# Patient Record
Sex: Female | Born: 1940
Health system: Southern US, Community
[De-identification: ages and names within clinical notes are randomized; demographics above are authoritative.]

## PROBLEM LIST (undated history)

## (undated) DIAGNOSIS — I824Z2 Acute embolism and thrombosis of unspecified deep veins of left distal lower extremity: Secondary | ICD-10-CM

## (undated) DIAGNOSIS — I82409 Acute embolism and thrombosis of unspecified deep veins of unspecified lower extremity: Secondary | ICD-10-CM

## (undated) DIAGNOSIS — K625 Hemorrhage of anus and rectum: Secondary | ICD-10-CM

## (undated) DIAGNOSIS — F0390 Unspecified dementia without behavioral disturbance: Secondary | ICD-10-CM

## (undated) DIAGNOSIS — E785 Hyperlipidemia, unspecified: Secondary | ICD-10-CM

## (undated) HISTORY — DX: Hemorrhage of anus and rectum: K62.5

## (undated) HISTORY — PX: OTHER SURGICAL HISTORY: SHX169

## (undated) HISTORY — DX: Acute embolism and thrombosis of unspecified deep veins of left distal lower extremity: I82.4Z2

## (undated) HISTORY — DX: Hyperlipidemia, unspecified: E78.5

---
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
## 1999-04-08 ENCOUNTER — Ambulatory Visit (HOSPITAL_BASED_OUTPATIENT_CLINIC_OR_DEPARTMENT_OTHER): Admission: RE | Admit: 1999-04-08 | Discharge: 1999-04-08 | Payer: Self-pay | Admitting: Plastic Surgery

## 1999-04-15 ENCOUNTER — Ambulatory Visit (HOSPITAL_BASED_OUTPATIENT_CLINIC_OR_DEPARTMENT_OTHER): Admission: RE | Admit: 1999-04-15 | Discharge: 1999-04-15 | Payer: Self-pay | Admitting: Plastic Surgery

## 1999-09-17 ENCOUNTER — Encounter: Admission: RE | Admit: 1999-09-17 | Discharge: 1999-09-17 | Payer: Self-pay | Admitting: Family Medicine

## 2000-03-15 ENCOUNTER — Encounter: Admission: RE | Admit: 2000-03-15 | Discharge: 2000-03-15 | Payer: Self-pay | Admitting: Family Medicine

## 2000-03-15 ENCOUNTER — Encounter: Payer: Self-pay | Admitting: Family Medicine

## 2003-02-26 ENCOUNTER — Encounter: Admission: RE | Admit: 2003-02-26 | Discharge: 2003-02-26 | Payer: Self-pay | Admitting: Family Medicine

## 2003-02-26 ENCOUNTER — Encounter: Payer: Self-pay | Admitting: Family Medicine

## 2003-05-07 DIAGNOSIS — E785 Hyperlipidemia, unspecified: Secondary | ICD-10-CM

## 2003-05-07 HISTORY — DX: Hyperlipidemia, unspecified: E78.5

## 2003-05-28 ENCOUNTER — Other Ambulatory Visit: Admission: RE | Admit: 2003-05-28 | Discharge: 2003-05-28 | Payer: Self-pay | Admitting: Family Medicine

## 2004-02-02 ENCOUNTER — Inpatient Hospital Stay (HOSPITAL_COMMUNITY): Admission: EM | Admit: 2004-02-02 | Discharge: 2004-02-04 | Payer: Self-pay | Admitting: Emergency Medicine

## 2004-02-03 DIAGNOSIS — D539 Nutritional anemia, unspecified: Secondary | ICD-10-CM | POA: Insufficient documentation

## 2004-02-03 DIAGNOSIS — D649 Anemia, unspecified: Secondary | ICD-10-CM | POA: Insufficient documentation

## 2004-02-12 ENCOUNTER — Ambulatory Visit (HOSPITAL_COMMUNITY): Admission: RE | Admit: 2004-02-12 | Discharge: 2004-02-12 | Payer: Self-pay | Admitting: General Surgery

## 2004-02-26 ENCOUNTER — Ambulatory Visit (HOSPITAL_COMMUNITY): Admission: RE | Admit: 2004-02-26 | Discharge: 2004-02-26 | Payer: Self-pay | Admitting: Family Medicine

## 2004-10-07 ENCOUNTER — Ambulatory Visit: Payer: Self-pay | Admitting: Family Medicine

## 2004-10-13 ENCOUNTER — Ambulatory Visit: Payer: Self-pay | Admitting: Family Medicine

## 2004-10-13 ENCOUNTER — Other Ambulatory Visit: Admission: RE | Admit: 2004-10-13 | Discharge: 2004-10-13 | Payer: Self-pay | Admitting: Family Medicine

## 2004-10-13 LAB — CONVERTED CEMR LAB: Pap Smear: NORMAL

## 2004-11-11 ENCOUNTER — Encounter: Admission: RE | Admit: 2004-11-11 | Discharge: 2004-11-11 | Payer: Self-pay | Admitting: Family Medicine

## 2004-11-18 ENCOUNTER — Ambulatory Visit: Payer: Self-pay | Admitting: Family Medicine

## 2005-07-08 ENCOUNTER — Ambulatory Visit: Payer: Self-pay | Admitting: Family Medicine

## 2005-12-13 ENCOUNTER — Encounter: Admission: RE | Admit: 2005-12-13 | Discharge: 2005-12-13 | Payer: Self-pay | Admitting: Family Medicine

## 2007-01-12 ENCOUNTER — Encounter: Admission: RE | Admit: 2007-01-12 | Discharge: 2007-01-12 | Payer: Self-pay | Admitting: Family Medicine

## 2007-01-24 ENCOUNTER — Ambulatory Visit: Payer: Self-pay | Admitting: Family Medicine

## 2007-04-20 ENCOUNTER — Ambulatory Visit: Payer: Self-pay | Admitting: Family Medicine

## 2007-04-20 LAB — CONVERTED CEMR LAB
ALT: 18 units/L (ref 0–40)
Basophils Relative: 0.7 % (ref 0.0–1.0)
Bilirubin, Direct: 0.1 mg/dL (ref 0.0–0.3)
CO2: 33 meq/L — ABNORMAL HIGH (ref 19–32)
Calcium: 9.6 mg/dL (ref 8.4–10.5)
Eosinophils Relative: 2.7 % (ref 0.0–5.0)
GFR calc Af Amer: 109 mL/min
Glucose, Bld: 94 mg/dL (ref 70–99)
HCT: 36.6 % (ref 36.0–46.0)
Hemoglobin: 12.1 g/dL (ref 12.0–15.0)
Lymphocytes Relative: 47.8 % — ABNORMAL HIGH (ref 12.0–46.0)
Monocytes Absolute: 0.8 10*3/uL — ABNORMAL HIGH (ref 0.2–0.7)
Neutro Abs: 1.9 10*3/uL (ref 1.4–7.7)
Neutrophils Relative %: 34.3 % — ABNORMAL LOW (ref 43.0–77.0)
Potassium: 3.5 meq/L (ref 3.5–5.1)
Sodium: 141 meq/L (ref 135–145)
Total Protein: 7.4 g/dL (ref 6.0–8.3)
VLDL: 10 mg/dL (ref 0–40)
WBC: 5.5 10*3/uL (ref 4.5–10.5)

## 2007-04-24 ENCOUNTER — Encounter: Payer: Self-pay | Admitting: Family Medicine

## 2007-04-24 DIAGNOSIS — E785 Hyperlipidemia, unspecified: Secondary | ICD-10-CM

## 2007-04-24 DIAGNOSIS — I8390 Asymptomatic varicose veins of unspecified lower extremity: Secondary | ICD-10-CM | POA: Insufficient documentation

## 2007-04-24 DIAGNOSIS — C44300 Unspecified malignant neoplasm of skin of unspecified part of face: Secondary | ICD-10-CM | POA: Insufficient documentation

## 2007-04-24 DIAGNOSIS — C44309 Unspecified malignant neoplasm of skin of other parts of face: Secondary | ICD-10-CM | POA: Insufficient documentation

## 2007-04-24 DIAGNOSIS — M810 Age-related osteoporosis without current pathological fracture: Secondary | ICD-10-CM

## 2007-04-25 ENCOUNTER — Other Ambulatory Visit: Admission: RE | Admit: 2007-04-25 | Discharge: 2007-04-25 | Payer: Self-pay | Admitting: Family Medicine

## 2007-04-25 ENCOUNTER — Encounter: Payer: Self-pay | Admitting: Family Medicine

## 2007-04-25 ENCOUNTER — Ambulatory Visit: Payer: Self-pay | Admitting: Family Medicine

## 2007-04-27 ENCOUNTER — Encounter: Payer: Self-pay | Admitting: Family Medicine

## 2007-05-08 ENCOUNTER — Ambulatory Visit: Payer: Self-pay | Admitting: Family Medicine

## 2007-05-09 ENCOUNTER — Encounter (INDEPENDENT_AMBULATORY_CARE_PROVIDER_SITE_OTHER): Payer: Self-pay | Admitting: *Deleted

## 2007-12-07 HISTORY — PX: COLONOSCOPY: SHX174

## 2008-01-22 ENCOUNTER — Encounter: Admission: RE | Admit: 2008-01-22 | Discharge: 2008-01-22 | Payer: Self-pay | Admitting: Family Medicine

## 2008-01-23 ENCOUNTER — Encounter (INDEPENDENT_AMBULATORY_CARE_PROVIDER_SITE_OTHER): Payer: Self-pay | Admitting: *Deleted

## 2008-05-09 ENCOUNTER — Ambulatory Visit: Payer: Self-pay | Admitting: Family Medicine

## 2008-05-09 LAB — CONVERTED CEMR LAB
ALT: 16 units/L (ref 0–35)
AST: 23 units/L (ref 0–37)
Alkaline Phosphatase: 48 units/L (ref 39–117)
Bilirubin, Direct: 0.1 mg/dL (ref 0.0–0.3)
CO2: 30 meq/L (ref 19–32)
Chloride: 105 meq/L (ref 96–112)
Cholesterol: 222 mg/dL (ref 0–200)
Glucose, Bld: 94 mg/dL (ref 70–99)
Hemoglobin: 12.5 g/dL (ref 12.0–15.0)
Lymphocytes Relative: 39.4 % (ref 12.0–46.0)
Monocytes Relative: 13 % — ABNORMAL HIGH (ref 3.0–12.0)
Neutro Abs: 2.3 10*3/uL (ref 1.4–7.7)
Platelets: 303 10*3/uL (ref 150–400)
Potassium: 4 meq/L (ref 3.5–5.1)
RDW: 12.8 % (ref 11.5–14.6)
Sodium: 143 meq/L (ref 135–145)
Total CHOL/HDL Ratio: 3.3
Total Protein: 7.4 g/dL (ref 6.0–8.3)
VLDL: 12 mg/dL (ref 0–40)

## 2008-05-13 ENCOUNTER — Ambulatory Visit: Payer: Self-pay | Admitting: Family Medicine

## 2008-05-13 ENCOUNTER — Other Ambulatory Visit: Admission: RE | Admit: 2008-05-13 | Discharge: 2008-05-13 | Payer: Self-pay | Admitting: Family Medicine

## 2008-05-13 ENCOUNTER — Encounter: Payer: Self-pay | Admitting: Family Medicine

## 2008-05-13 LAB — CONVERTED CEMR LAB
Pap Smear: NORMAL
Vit D, 1,25-Dihydroxy: 17 — ABNORMAL LOW (ref 30–89)

## 2008-05-16 ENCOUNTER — Encounter (INDEPENDENT_AMBULATORY_CARE_PROVIDER_SITE_OTHER): Payer: Self-pay | Admitting: *Deleted

## 2008-05-28 ENCOUNTER — Ambulatory Visit: Payer: Self-pay | Admitting: Gastroenterology

## 2008-06-11 ENCOUNTER — Ambulatory Visit: Payer: Self-pay | Admitting: Gastroenterology

## 2008-06-11 ENCOUNTER — Encounter: Payer: Self-pay | Admitting: Gastroenterology

## 2008-06-13 ENCOUNTER — Encounter: Payer: Self-pay | Admitting: Gastroenterology

## 2008-07-17 ENCOUNTER — Ambulatory Visit: Payer: Self-pay | Admitting: Family Medicine

## 2008-08-08 ENCOUNTER — Ambulatory Visit: Payer: Self-pay | Admitting: Family Medicine

## 2008-10-07 ENCOUNTER — Ambulatory Visit: Payer: Self-pay | Admitting: Family Medicine

## 2008-10-08 LAB — CONVERTED CEMR LAB: Vit D, 1,25-Dihydroxy: 33 (ref 30–89)

## 2008-11-11 ENCOUNTER — Telehealth: Payer: Self-pay | Admitting: Family Medicine

## 2009-01-27 ENCOUNTER — Encounter: Admission: RE | Admit: 2009-01-27 | Discharge: 2009-01-27 | Payer: Self-pay | Admitting: Family Medicine

## 2009-01-28 ENCOUNTER — Encounter (INDEPENDENT_AMBULATORY_CARE_PROVIDER_SITE_OTHER): Payer: Self-pay | Admitting: *Deleted

## 2009-05-12 ENCOUNTER — Ambulatory Visit: Payer: Self-pay | Admitting: Family Medicine

## 2009-05-12 LAB — CONVERTED CEMR LAB
ALT: 14 units/L (ref 0–35)
Alkaline Phosphatase: 46 units/L (ref 39–117)
Basophils Absolute: 0 10*3/uL (ref 0.0–0.1)
Bilirubin, Direct: 0.1 mg/dL (ref 0.0–0.3)
CO2: 33 meq/L — ABNORMAL HIGH (ref 19–32)
Calcium: 9.2 mg/dL (ref 8.4–10.5)
Chloride: 110 meq/L (ref 96–112)
Direct LDL: 135.7 mg/dL
Eosinophils Absolute: 0.1 10*3/uL (ref 0.0–0.7)
Folate: 12 ng/mL
HDL: 71.4 mg/dL (ref >39.00)
Hemoglobin: 12.9 g/dL (ref 12.0–15.0)
Iron: 63 ug/dL (ref 42–145)
Lymphocytes Relative: 43 % (ref 12.0–46.0)
Monocytes Relative: 13.9 % — ABNORMAL HIGH (ref 3.0–12.0)
Neutro Abs: 1.9 10*3/uL (ref 1.4–7.7)
Neutrophils Relative %: 40.4 % — ABNORMAL LOW (ref 43.0–77.0)
RDW: 12.8 % (ref 11.5–14.6)
Sodium: 145 meq/L (ref 135–145)
Total Bilirubin: 1 mg/dL (ref 0.3–1.2)
Total CHOL/HDL Ratio: 3
Total Protein: 7.5 g/dL (ref 6.0–8.3)
Vitamin B-12: 295 pg/mL (ref 211–911)

## 2009-05-13 LAB — CONVERTED CEMR LAB: Vit D, 25-Hydroxy: 29 ng/mL — ABNORMAL LOW (ref 30–89)

## 2009-05-14 ENCOUNTER — Ambulatory Visit: Payer: Self-pay | Admitting: Family Medicine

## 2009-05-14 DIAGNOSIS — K5730 Diverticulosis of large intestine without perforation or abscess without bleeding: Secondary | ICD-10-CM | POA: Insufficient documentation

## 2009-05-14 DIAGNOSIS — F411 Generalized anxiety disorder: Secondary | ICD-10-CM | POA: Insufficient documentation

## 2009-05-29 ENCOUNTER — Ambulatory Visit: Payer: Self-pay | Admitting: Family Medicine

## 2009-05-29 LAB — CONVERTED CEMR LAB: OCCULT 2: NEGATIVE

## 2009-06-13 ENCOUNTER — Ambulatory Visit: Payer: Self-pay | Admitting: Family Medicine

## 2009-06-13 LAB — FECAL OCCULT BLOOD, GUAIAC: Fecal Occult Blood: NEGATIVE

## 2009-06-13 LAB — CONVERTED CEMR LAB: OCCULT 1: NEGATIVE

## 2009-06-30 ENCOUNTER — Encounter (INDEPENDENT_AMBULATORY_CARE_PROVIDER_SITE_OTHER): Payer: Self-pay | Admitting: *Deleted

## 2009-06-30 ENCOUNTER — Ambulatory Visit: Payer: Self-pay | Admitting: Family Medicine

## 2009-06-30 LAB — CONVERTED CEMR LAB
Bilirubin Urine: NEGATIVE
Glucose, Urine, Semiquant: NEGATIVE
Specific Gravity, Urine: 1.02
Urobilinogen, UA: 0.2
pH: 6

## 2009-07-01 ENCOUNTER — Encounter: Payer: Self-pay | Admitting: Family Medicine

## 2009-11-19 ENCOUNTER — Telehealth: Payer: Self-pay | Admitting: Family Medicine

## 2010-01-29 ENCOUNTER — Encounter: Admission: RE | Admit: 2010-01-29 | Discharge: 2010-01-29 | Payer: Self-pay | Admitting: Family Medicine

## 2010-01-29 LAB — HM MAMMOGRAPHY: HM Mammogram: NORMAL

## 2010-05-15 ENCOUNTER — Ambulatory Visit: Payer: Self-pay | Admitting: Family Medicine

## 2010-05-17 LAB — CONVERTED CEMR LAB
Alkaline Phosphatase: 51 units/L (ref 39–117)
BUN: 14 mg/dL (ref 6–23)
Basophils Relative: 0.6 % (ref 0.0–3.0)
Bilirubin, Direct: 0 mg/dL (ref 0.0–0.3)
Calcium: 9.4 mg/dL (ref 8.4–10.5)
Chloride: 105 meq/L (ref 96–112)
Cholesterol: 210 mg/dL — ABNORMAL HIGH (ref 0–200)
Creatinine, Ser: 0.7 mg/dL (ref 0.4–1.2)
Eosinophils Absolute: 0.1 10*3/uL (ref 0.0–0.7)
Eosinophils Relative: 2 % (ref 0.0–5.0)
HDL: 66.2 mg/dL (ref >39.00)
Iron: 52 ug/dL (ref 42–145)
Lymphocytes Relative: 46 % (ref 12.0–46.0)
MCHC: 33.7 g/dL (ref 30.0–36.0)
MCV: 89.2 fL (ref 78.0–100.0)
Monocytes Absolute: 0.7 10*3/uL (ref 0.1–1.0)
Neutrophils Relative %: 37.8 % — ABNORMAL LOW (ref 43.0–77.0)
Platelets: 292 10*3/uL (ref 150.0–400.0)
RBC: 4.08 M/uL (ref 3.87–5.11)
Total Bilirubin: 0.6 mg/dL (ref 0.3–1.2)
Total CHOL/HDL Ratio: 3
Total Protein: 7.3 g/dL (ref 6.0–8.3)
Triglycerides: 54 mg/dL (ref 0.0–149.0)
VLDL: 10.8 mg/dL (ref 0.0–40.0)
Vitamin B-12: 338 pg/mL (ref 211–911)
WBC: 5.4 10*3/uL (ref 4.5–10.5)

## 2010-05-20 ENCOUNTER — Ambulatory Visit: Payer: Self-pay | Admitting: Family Medicine

## 2010-05-20 ENCOUNTER — Other Ambulatory Visit: Admission: RE | Admit: 2010-05-20 | Discharge: 2010-05-20 | Payer: Self-pay | Admitting: Family Medicine

## 2010-05-20 LAB — HM PAP SMEAR

## 2010-05-20 LAB — CONVERTED CEMR LAB: Pap Smear: NORMAL

## 2010-05-25 LAB — CONVERTED CEMR LAB: Pap Smear: NEGATIVE

## 2010-05-26 ENCOUNTER — Encounter (INDEPENDENT_AMBULATORY_CARE_PROVIDER_SITE_OTHER): Payer: Self-pay | Admitting: *Deleted

## 2010-07-14 ENCOUNTER — Encounter (INDEPENDENT_AMBULATORY_CARE_PROVIDER_SITE_OTHER): Payer: Self-pay | Admitting: *Deleted

## 2010-07-21 ENCOUNTER — Ambulatory Visit: Payer: Self-pay | Admitting: Internal Medicine

## 2010-07-21 DIAGNOSIS — R300 Dysuria: Secondary | ICD-10-CM | POA: Insufficient documentation

## 2010-07-21 LAB — CONVERTED CEMR LAB
Glucose, Urine, Semiquant: NEGATIVE
Nitrite: NEGATIVE
Specific Gravity, Urine: 1.015
pH: 6.5

## 2010-07-22 ENCOUNTER — Encounter: Payer: Self-pay | Admitting: Family Medicine

## 2010-12-28 ENCOUNTER — Other Ambulatory Visit: Payer: Self-pay | Admitting: Family Medicine

## 2010-12-28 DIAGNOSIS — Z1239 Encounter for other screening for malignant neoplasm of breast: Secondary | ICD-10-CM
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
## 2011-01-05 NOTE — Letter (Signed)
Summary: Results Follow up Letter  [place] [facility]  [address] [place], [place] [address]   Phone: [phone]  Fax: [phone]    05/26/2010 MRN: [id number]  Kimberly Davies [address] [place], [place]  [address]  Dear [name],  The following are the results of your recent test(s):  Test         Result    Pap Smear:        Normal __X___  Not Normal _____ Comments: Please repeat in one year. ______________________________________________________ Cholesterol: LDL(Bad cholesterol):         Your goal is less than:         HDL (Good cholesterol):       Your goal is more than: Comments:  ______________________________________________________ Mammogram:        Normal _____  Not Normal _____ Comments:  ___________________________________________________________________ Hemoccult:        Normal _____  Not normal _______ Comments:    _____________________________________________________________________ Other Tests:    We routinely do not discuss normal results over the telephone.  If you desire a copy of the results, or you have any questions about this information we can discuss them at your next office visit.   Sincerely,    Laurita Quint, MD

## 2011-01-05 NOTE — Assessment & Plan Note (Signed)
Summary: ?UTI/CLE   Vital Signs:  Patient profile:   70 year old female Weight:      115.25 pounds Temp:     98.1 degrees F oral Pulse rate:   80 / minute Pulse rhythm:   regular BP sitting:   108 / 60  (left arm) Cuff size:   regular  Vitals Entered By: Selena Batten Dance CMA Duncan Dull) (July 21, 2010 3:37 PM) CC: ? UTI (burning and frequency)   History of Present Illness: CC: ? UTI  2wk h/o polyuria and dysuria, urgency.  No fevers, chills, nausea,vomiting, flank pain.  + suprapubic pressure.  Only has had one UTI in life.  Drinking plenty of fluid, mostly water.  Overall healthy.  Current Medications (verified): 1)  Actonel 35 Mg Tabs (Risedronate Sodium) .Marland Kitchen.. 1 Tablet Weekly By Mouth 2)  Vitamin D 1000 Unit Caps (Cholecalciferol) .... One Cap By Mouth Once Daily 3)  Aspirin 500 Mg Tbec (Aspirin) .... As Needed  Allergies: 1)  ! Codeine 2)  ! Penicillin 3)  * Clindamycin  Review of Systems       per HPI  Physical Exam  General:  Well-developed,well-nourished,in no acute distress; alert,appropriate and cooperative throughout examination Lungs:  Normal respiratory effort, chest expands symmetrically. Lungs are clear to auscultation, no crackles or wheezes. Heart:  Normal rate and regular rhythm. S1 and S2 normal without gallop, murmur, click, rub or other extra sounds. Abdomen:  Bowel sounds positive,abdomen soft and non-tender without masses, organomegaly or hernias noted.  + pressure with palpation of suprapubic region. Extremities:  No clubbing, cyanosis, edema, or deformity noted with normal full range of motion of all joints.     Impression & Recommendations:  Problem # 1:  DYSURIA (ICD-788.1)  classic sxs of UTI, micro suspicious for infection.  sent UCx.  treat with cipro.  red flags to return discussed.  Her updated medication list for this problem includes:    Cipro 500 Mg Tabs (Ciprofloxacin hcl) ..... One by mouth two times a day x 5 days  Orders: UA Dipstick  W/ Micro (manual) (21308) T-Urine Culture (Spectrum Order) [phone])  Complete Medication List: 1)  Actonel 35 Mg Tabs (Risedronate sodium) .Marland Kitchen.. 1 tablet weekly by mouth 2)  Vitamin D 1000 Unit Caps (Cholecalciferol) .... One cap by mouth once daily 3)  Aspirin 500 Mg Tbec (Aspirin) .... As needed 4)  Cipro 500 Mg Tabs (Ciprofloxacin hcl) .... One by mouth two times a day x 5 days  Patient Instructions: 1)  Looks like you have a UTI. 2)  Treat with course of ciprofloxacin for next 5 days twice daily. 3)  We will call you with results of urine culture. 4)  Pleasure to meet you today.  Please call usor return if you start having fevers, chills, or worsening abdominal or back pain. Prescriptions: CIPRO 500 MG TABS (CIPROFLOXACIN HCL) one by mouth two times a day x 5 days  #10 x 0   Entered and Authorized by:   Eustaquio Boyden  MD   Signed by:   Eustaquio Boyden  MD on 07/21/2010   Method used:   Print then Give to Patient   RxID:   [id number]   Current Allergies (reviewed today): ! CODEINE ! PENICILLIN * CLINDAMYCIN  Laboratory Results   Urine Tests  Date/Time Received: July 21, 2010 3:38 PM  Date/Time Reported: July 21, 2010 3:38 PM   Routine Urinalysis   Color: yellow Appearance: Hazy Glucose: negative   (Normal Range: Negative) Bilirubin: negative   (  Normal Range: Negative) Ketone: negative   (Normal Range: Negative) Spec. Gravity: 1.015   (Normal Range: 1.003-1.035) Blood: moderate   (Normal Range: Negative) pH: 6.5   (Normal Range: 5.0-8.0) Protein: negative   (Normal Range: Negative) Urobilinogen: 0.2   (Normal Range: 0-1) Nitrite: negative   (Normal Range: Negative) Leukocyte Esterace: moderate   (Normal Range: Negative)  Urine Microscopic WBC/HPF: 1-5 RBC/HPF: 10-20 Bacteria/HPF: 1+ Mucous/HPF: none Epithelial/HPF: rare Crystals/HPF: none Casts/LPF: none Yeast/HPF: none    Comments: read by....................... Eustaquio Boyden  MD   July 21, 2010 5:20 PM  UCx sent.     Appended Document: ?UTI/CLE

## 2011-01-05 NOTE — Letter (Signed)
Summary: [name] letter  [place] [facility]  [address] [place], [place] [address]   Phone: [phone]  Fax: [phone]       07/14/2010 MRN: [id number]  Kimberly Davies [address] [place], [place]  [address]  Dear [name], and [facility] announce the retirement of Arta Silence, M.D., from full-time practice at the [facility] office effective June 04, 2010 and his plans of returning part-time.  It is important to Dr. Hetty Ely and to our practice that you understand that [facility] has seven physicians in our office for your health care needs.  We will continue to offer the same exceptional care that you have today.    Dr. Hetty Ely has spoken to many of you about his plans for retirement and returning part-time in the fall.   We will continue to work with you through the transition to schedule appointments for you in the office and meet the high standards that [facility] [place] is committed to.   Again, it is with great pleasure that we share the news that Dr. Hetty Ely will return to [facility] at [facility] in October of 2011 with a reduced schedule.    If you have any questions, or would like to request an appointment with one of our physicians, please call us at [phone] and press the option for Scheduling an appointment.  We take pleasure in providing you with excellent patient care and look forward to seeing you at your next office visit.  Our [facility] Physicians are:  Tillman Abide, M.D. Laurita Quint, M.D. Roxy Manns, M.D. Kerby Nora, M.D. Hannah Beat, M.D. Ruthe Mannan, M.D. We proudly welcomed Raechel Ache, M.D. and Eustaquio Boyden, M.D. to the practice in July/August 2011.  Sincerely,  [facility]

## 2011-02-01 ENCOUNTER — Ambulatory Visit: Payer: Self-pay

## 2011-02-01 ENCOUNTER — Ambulatory Visit
Admission: RE | Admit: 2011-02-01 | Discharge: 2011-02-01 | Disposition: A | Discharge status: Home / Self Care | Payer: Self-pay | Source: Ambulatory Visit | Attending: Family Medicine | Admitting: Family Medicine

## 2011-02-01 DIAGNOSIS — Z1239 Encounter for other screening for malignant neoplasm of breast: Secondary | ICD-10-CM
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
## 2011-05-18 ENCOUNTER — Other Ambulatory Visit: Payer: Self-pay | Admitting: Family Medicine

## 2011-05-18 DIAGNOSIS — E785 Hyperlipidemia, unspecified: Secondary | ICD-10-CM

## 2011-05-18 DIAGNOSIS — M810 Age-related osteoporosis without current pathological fracture: Secondary | ICD-10-CM

## 2011-05-18 DIAGNOSIS — D539 Nutritional anemia, unspecified: Secondary | ICD-10-CM

## 2011-05-24 ENCOUNTER — Other Ambulatory Visit (INDEPENDENT_AMBULATORY_CARE_PROVIDER_SITE_OTHER): Payer: Medicare Other | Admitting: Family Medicine

## 2011-05-24 DIAGNOSIS — D539 Nutritional anemia, unspecified: Secondary | ICD-10-CM

## 2011-05-24 DIAGNOSIS — E785 Hyperlipidemia, unspecified: Secondary | ICD-10-CM

## 2011-05-24 DIAGNOSIS — M810 Age-related osteoporosis without current pathological fracture: Secondary | ICD-10-CM

## 2011-05-24 LAB — LIPID PANEL
Cholesterol: 218 mg/dL — ABNORMAL HIGH (ref 0–200)
Triglycerides: 62 mg/dL (ref 0.0–149.0)
VLDL: 12.4 mg/dL (ref 0.0–40.0)

## 2011-05-24 LAB — HEPATIC FUNCTION PANEL
AST: 29 U/L (ref 0–37)
Albumin: 4.1 g/dL (ref 3.5–5.2)
Alkaline Phosphatase: 47 U/L (ref 39–117)
Total Protein: 7.4 g/dL (ref 6.0–8.3)

## 2011-05-24 LAB — BASIC METABOLIC PANEL
Calcium: 8.8 mg/dL (ref 8.4–10.5)
GFR: 107.9 mL/min (ref >60.00)
Potassium: 3.9 mEq/L (ref 3.5–5.1)
Sodium: 142 mEq/L (ref 135–145)

## 2011-05-24 LAB — CBC WITH DIFFERENTIAL/PLATELET
Basophils Absolute: 0 10*3/uL (ref 0.0–0.1)
Eosinophils Relative: 2.3 % (ref 0.0–5.0)
HCT: 36.7 % (ref 36.0–46.0)
Hemoglobin: 12.2 g/dL (ref 12.0–15.0)
Lymphocytes Relative: 43.1 % (ref 12.0–46.0)
Lymphs Abs: 2.4 10*3/uL (ref 0.7–4.0)
Monocytes Relative: 10.5 % (ref 3.0–12.0)
Neutro Abs: 2.4 10*3/uL (ref 1.4–7.7)
RBC: 4.09 Mil/uL (ref 3.87–5.11)
WBC: 5.5 10*3/uL (ref 4.5–10.5)

## 2011-05-27 ENCOUNTER — Encounter: Payer: Self-pay | Admitting: Family Medicine

## 2011-06-05 ENCOUNTER — Encounter: Payer: Self-pay | Admitting: Family Medicine

## 2011-06-17 ENCOUNTER — Ambulatory Visit (INDEPENDENT_AMBULATORY_CARE_PROVIDER_SITE_OTHER): Payer: Medicare Other | Admitting: Family Medicine

## 2011-06-17 ENCOUNTER — Encounter: Payer: Self-pay | Admitting: Family Medicine

## 2011-06-17 DIAGNOSIS — M810 Age-related osteoporosis without current pathological fracture: Secondary | ICD-10-CM

## 2011-06-17 DIAGNOSIS — D126 Benign neoplasm of colon, unspecified: Secondary | ICD-10-CM

## 2011-06-17 DIAGNOSIS — K5730 Diverticulosis of large intestine without perforation or abscess without bleeding: Secondary | ICD-10-CM

## 2011-06-17 DIAGNOSIS — Z0000 Encounter for general adult medical examination without abnormal findings: Secondary | ICD-10-CM

## 2011-06-17 DIAGNOSIS — E785 Hyperlipidemia, unspecified: Secondary | ICD-10-CM

## 2011-06-17 DIAGNOSIS — D539 Nutritional anemia, unspecified: Secondary | ICD-10-CM

## 2011-06-17 NOTE — Progress Notes (Signed)
  Subjective:    Patient ID: Kimberly Davies, female    DOB: 01-22-1941, 70 y.o.   MRN: [id number]  HPI Pt here for Comp Exam. Her brother passed away 2023-07-21 last year. He had radiation and did not do well.  Her grandchildren are well. She has Derm appt in Aug.     Review of Systems  Constitutional: Negative for fever, chills, diaphoresis and fatigue. Unexpected weight change: continue with slow wt loss.  HENT: Negative for hearing loss, ear pain, rhinorrhea, trouble swallowing and tinnitus.   Eyes: Negative for pain, discharge and visual disturbance.       Gets regular eye checkups.  Respiratory: Negative for cough, shortness of breath and wheezing.   Cardiovascular: Negative for chest pain, palpitations and leg swelling.       No Fainting or Fatigue.  Gastrointestinal: Negative for nausea, vomiting, abdominal pain, diarrhea, constipation and blood in stool.       No Heartburn Has diverticulosis.  Genitourinary: Negative for dysuria and frequency.       Has frequency, no change.  Musculoskeletal: Negative for myalgias, back pain and arthralgias.       Global musc joint aches.  Skin: Negative for rash.       No Itching or Dryness.  Neurological: Positive for syncope. Negative for tremors and numbness.       No Tingling. No Balance Problems.  Hematological: Negative for adenopathy. Does not bruise/bleed easily.  Psychiatric/Behavioral: Negative for dysphoric mood and agitation.       Objective:   Physical Exam  Constitutional: She is oriented to person, place, and time. She appears well-developed and well-nourished. No distress.  HENT:  Head: Normocephalic and atraumatic.  Right Ear: External ear normal.  Left Ear: External ear normal.  Nose: Nose normal.  Mouth/Throat: Oropharynx is clear and moist.  Eyes: Conjunctivae and EOM are normal. Pupils are equal, round, and reactive to light. No scleral icterus.  Neck: Normal range of motion. Neck supple. No thyromegaly present.    Cardiovascular: Normal rate, regular rhythm, normal heart sounds and intact distal pulses.   No murmur heard. Pulmonary/Chest: Effort normal and breath sounds normal. No respiratory distress. She has no wheezes.       Breast exam normal, no masses tenderness skin changes or axillary adenopathy.  Abdominal: Soft. Bowel sounds are normal. She exhibits no mass. There is no tenderness.  Musculoskeletal: Normal range of motion.       Back Nontender in the Midline  Lymphadenopathy:    She has no cervical adenopathy.  Neurological: She is alert and oriented to person, place, and time. She exhibits normal muscle tone. Coordination normal.  Skin: Skin is warm and dry. No rash noted. She is not diaphoretic.  Psychiatric: She has a normal mood and affect. Her behavior is normal. Judgment and thought content normal.          Assessment & Plan:  HMPE

## 2011-06-17 NOTE — Patient Instructions (Addendum)
Refer for Dexa. Stop Actonel. Investigate Zostavax (Shingles shot) RTC one year

## 2011-06-17 NOTE — Assessment & Plan Note (Signed)
Almost adequate as is. Will get back to exercise and diet sounds reasonable. Cont as is.

## 2011-06-17 NOTE — Assessment & Plan Note (Signed)
Well controlled. B12 could be slightly higher. Discussed. Lab Results  Component Value Date   WBC 5.5 05/24/2011   HGB 12.2 05/24/2011   HCT 36.7 05/24/2011   MCV 89.6 05/24/2011   PLT 278.0 05/24/2011

## 2011-06-17 NOTE — Assessment & Plan Note (Signed)
Colonoscopy in 09. Due next year. Diarrhea today but no other GI probs.

## 2011-06-17 NOTE — Assessment & Plan Note (Signed)
Discussed being seen for p[rolonged LLQ discomfort. 

## 2011-06-17 NOTE — Assessment & Plan Note (Signed)
To see Derm next month.

## 2011-06-17 NOTE — Assessment & Plan Note (Addendum)
Will recheck this year via DEXA.  Stop Actonel after last current script dose.

## 2011-07-07 ENCOUNTER — Ambulatory Visit
Admission: RE | Admit: 2011-07-07 | Discharge: 2011-07-07 | Disposition: A | Discharge status: Home / Self Care | Payer: Medicare Other | Source: Ambulatory Visit | Attending: Family Medicine | Admitting: Family Medicine

## 2011-07-07 DIAGNOSIS — M810 Age-related osteoporosis without current pathological fracture: Secondary | ICD-10-CM

## 2011-07-07 HISTORY — PX: OTHER SURGICAL HISTORY: SHX169

## 2011-07-21 ENCOUNTER — Ambulatory Visit (INDEPENDENT_AMBULATORY_CARE_PROVIDER_SITE_OTHER): Payer: Medicare Other | Admitting: Family Medicine

## 2011-07-21 ENCOUNTER — Encounter: Payer: Self-pay | Admitting: Family Medicine

## 2011-07-21 DIAGNOSIS — M810 Age-related osteoporosis without current pathological fracture: Secondary | ICD-10-CM

## 2011-07-21 NOTE — Progress Notes (Signed)
  Subjective:    Patient ID: Kimberly Davies, female    DOB: [birth year], 70 y.o.   MRN: [id number]  HPI Pt here for discussion of Bone density results. She recently had bone density test done after being on Actonel for five years since early 07. She has tolerated it well but is time to stop it due to possible side effects. She has just recently started on Vit D replacement and is not on Calcium. She has no complaints and feels well.   Review of Systems  Constitutional: Negative for fever, chills, diaphoresis, activity change and fatigue.  HENT: Negative for ear pain, congestion, rhinorrhea and postnasal drip.   Eyes: Negative for redness.  Respiratory: Negative for cough, chest tightness, shortness of breath and wheezing.   Cardiovascular: Negative for chest pain.  Noncontributory except as above.       Objective:   Physical Exam        Assessment & Plan:

## 2011-07-21 NOTE — Assessment & Plan Note (Signed)
Pt's DEXA discussed. She is just slightly worse in the femoral neck area. Would stop Actonel due to duration of taking it. Start Vit D 1000Iu bid regularly. Start Calcium 1500mg  a day. Check internet for gluconate vs carbonate. Start regular weight bearing exercise daily. Recheck DEXA in  2 years.

## 2011-07-26 ENCOUNTER — Encounter: Payer: Self-pay | Admitting: Family Medicine

## 2011-07-26 ENCOUNTER — Telehealth: Payer: Self-pay | Admitting: *Deleted

## 2011-07-26 MED ORDER — ZOSTER VACCINE LIVE 19400 UNT/0.65ML ~~LOC~~ SOLR: 0.6500 mL | Freq: Once | SUBCUTANEOUS | Status: DC

## 2011-07-26 NOTE — Telephone Encounter (Signed)
I see this was discussed at last CPE.  Sent in.  Please have her let us know when administered to update chart.

## 2011-07-26 NOTE — Telephone Encounter (Signed)
Needs order for shingles vac sent to [facility] pharmacy.

## 2011-07-26 NOTE — Telephone Encounter (Signed)
Patient notified and will call when it has been administered.

## 2011-08-03 ENCOUNTER — Other Ambulatory Visit: Payer: Self-pay | Admitting: Family Medicine

## 2011-08-17 ENCOUNTER — Other Ambulatory Visit: Payer: Self-pay | Admitting: Dermatology

## 2011-11-18 ENCOUNTER — Ambulatory Visit (INDEPENDENT_AMBULATORY_CARE_PROVIDER_SITE_OTHER): Payer: Medicare Other

## 2011-11-18 DIAGNOSIS — Z23 Encounter for immunization: Secondary | ICD-10-CM

## 2012-01-05 ENCOUNTER — Other Ambulatory Visit: Payer: Self-pay | Admitting: Family Medicine

## 2012-01-05 DIAGNOSIS — Z1231 Encounter for screening mammogram for malignant neoplasm of breast: Secondary | ICD-10-CM

## 2012-02-03 ENCOUNTER — Ambulatory Visit
Admission: RE | Admit: 2012-02-03 | Discharge: 2012-02-03 | Disposition: A | Discharge status: Home / Self Care | Payer: Medicare Other | Source: Ambulatory Visit | Attending: Family Medicine | Admitting: Family Medicine

## 2012-02-03 DIAGNOSIS — Z1231 Encounter for screening mammogram for malignant neoplasm of breast: Secondary | ICD-10-CM | POA: Diagnosis not present

## 2012-02-04 ENCOUNTER — Encounter: Payer: Self-pay | Admitting: *Deleted

## 2012-06-18 ENCOUNTER — Encounter: Payer: Self-pay | Admitting: Family Medicine

## 2012-06-18 ENCOUNTER — Other Ambulatory Visit: Payer: Self-pay | Admitting: Family Medicine

## 2012-06-18 DIAGNOSIS — D539 Nutritional anemia, unspecified: Secondary | ICD-10-CM

## 2012-06-18 DIAGNOSIS — E785 Hyperlipidemia, unspecified: Secondary | ICD-10-CM

## 2012-06-18 DIAGNOSIS — E559 Vitamin D deficiency, unspecified: Secondary | ICD-10-CM | POA: Insufficient documentation

## 2012-06-18 DIAGNOSIS — E538 Deficiency of other specified B group vitamins: Secondary | ICD-10-CM | POA: Insufficient documentation

## 2012-06-21 ENCOUNTER — Other Ambulatory Visit (INDEPENDENT_AMBULATORY_CARE_PROVIDER_SITE_OTHER): Payer: Medicare Other

## 2012-06-21 DIAGNOSIS — E785 Hyperlipidemia, unspecified: Secondary | ICD-10-CM | POA: Diagnosis not present

## 2012-06-21 DIAGNOSIS — E538 Deficiency of other specified B group vitamins: Secondary | ICD-10-CM

## 2012-06-21 DIAGNOSIS — D539 Nutritional anemia, unspecified: Secondary | ICD-10-CM | POA: Diagnosis not present

## 2012-06-21 DIAGNOSIS — E559 Vitamin D deficiency, unspecified: Secondary | ICD-10-CM

## 2012-06-21 LAB — CBC WITH DIFFERENTIAL/PLATELET
Basophils Absolute: 0.1 10*3/uL (ref 0.0–0.1)
HCT: 38.4 % (ref 36.0–46.0)
Lymphs Abs: 2.1 10*3/uL (ref 0.7–4.0)
MCV: 89.5 fl (ref 78.0–100.0)
Monocytes Absolute: 0.6 10*3/uL (ref 0.1–1.0)
Neutrophils Relative %: 47.9 % (ref 43.0–77.0)
Platelets: 261 10*3/uL (ref 150.0–400.0)
RDW: 13.8 % (ref 11.5–14.6)
WBC: 5.5 10*3/uL (ref 4.5–10.5)

## 2012-06-21 LAB — LIPID PANEL
Cholesterol: 206 mg/dL — ABNORMAL HIGH (ref 0–200)
HDL: 72.1 mg/dL (ref >39.00)
VLDL: 15.4 mg/dL (ref 0.0–40.0)

## 2012-06-21 LAB — BASIC METABOLIC PANEL
BUN: 20 mg/dL (ref 6–23)
Chloride: 106 mEq/L (ref 96–112)
Creatinine, Ser: 0.7 mg/dL (ref 0.4–1.2)
GFR: 82.81 mL/min (ref >60.00)
Glucose, Bld: 91 mg/dL (ref 70–99)
Potassium: 4.3 mEq/L (ref 3.5–5.1)

## 2012-06-21 LAB — VITAMIN B12: Vitamin B-12: 301 pg/mL (ref 211–911)

## 2012-06-22 LAB — VITAMIN D 25 HYDROXY (VIT D DEFICIENCY, FRACTURES): Vit D, 25-Hydroxy: 46 ng/mL (ref 30–89)

## 2012-06-26 ENCOUNTER — Encounter: Payer: Self-pay | Admitting: Family Medicine

## 2012-06-26 ENCOUNTER — Ambulatory Visit (INDEPENDENT_AMBULATORY_CARE_PROVIDER_SITE_OTHER): Payer: Medicare Other | Admitting: Family Medicine

## 2012-06-26 VITALS — BP 100/62 | HR 67 | Temp 98.4°F | Ht 62.0 in | Wt 111.0 lb

## 2012-06-26 DIAGNOSIS — S139XXA Sprain of joints and ligaments of unspecified parts of neck, initial encounter: Secondary | ICD-10-CM | POA: Diagnosis not present

## 2012-06-26 DIAGNOSIS — E538 Deficiency of other specified B group vitamins: Secondary | ICD-10-CM | POA: Diagnosis not present

## 2012-06-26 DIAGNOSIS — E559 Vitamin D deficiency, unspecified: Secondary | ICD-10-CM

## 2012-06-26 DIAGNOSIS — S161XXA Strain of muscle, fascia and tendon at neck level, initial encounter: Secondary | ICD-10-CM | POA: Insufficient documentation

## 2012-06-26 DIAGNOSIS — Z0000 Encounter for general adult medical examination without abnormal findings: Secondary | ICD-10-CM | POA: Diagnosis not present

## 2012-06-26 DIAGNOSIS — M810 Age-related osteoporosis without current pathological fracture: Secondary | ICD-10-CM

## 2012-06-26 DIAGNOSIS — E785 Hyperlipidemia, unspecified: Secondary | ICD-10-CM

## 2012-06-26 NOTE — Assessment & Plan Note (Signed)
Recheck dexa 2014.  Off bisphosphonate (had been on this for 7-8 yrs)

## 2012-06-26 NOTE — Patient Instructions (Addendum)
Good to meet you today! Call us with quesitons. Return in 1 year or as needed. Try stretching exercises for anticipated cervical muscle strain. Start b12 or multivitamin.
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
## 2012-06-26 NOTE — Assessment & Plan Note (Signed)
Anticipate neck strain. No red flags. Provided with stretching exercises.  Update if not improved

## 2012-06-26 NOTE — Assessment & Plan Note (Signed)
Good control, continue vit D.

## 2012-06-26 NOTE — Assessment & Plan Note (Addendum)
I have personally reviewed the Medicare Annual Wellness questionnaire and have noted 1. The patient's medical and social history 2. Their use of alcohol, tobacco or illicit drugs 3. Their current medications and supplements 4. The patient's functional ability including ADL's, fall risks, home safety risks and hearing or visual impairment. 5. Diet and physical activity 6. Evidence for depression or mood disorders The patients weight, height, BMI have been recorded in the chart.  Hearing and vision has been addressed. I have made referrals, counseling and provided education to the patient based review of the above and I have provided the pt with a written personalized care plan for preventive services. See scanned questionairre. Advanced directives discussed: pt requests information.  Provided with packet.  Reviewed preventative protocols and updated unless pt declined. mammo 01/2012 WNL.  Breast exam today. Reviewed blood work in detail.

## 2012-06-26 NOTE — Assessment & Plan Note (Signed)
Very mild, diet controlled.  No need for meds.

## 2012-06-26 NOTE — Assessment & Plan Note (Signed)
rec start B12 supplementation given low normal values.

## 2012-06-29 ENCOUNTER — Other Ambulatory Visit: Payer: Self-pay | Admitting: Dermatology

## 2012-06-29 DIAGNOSIS — D2310 Other benign neoplasm of skin of unspecified eyelid, including canthus: Secondary | ICD-10-CM | POA: Diagnosis not present

## 2012-06-29 DIAGNOSIS — D485 Neoplasm of uncertain behavior of skin: Secondary | ICD-10-CM | POA: Diagnosis not present

## 2012-06-29 DIAGNOSIS — L821 Other seborrheic keratosis: Secondary | ICD-10-CM | POA: Diagnosis not present

## 2012-06-29 DIAGNOSIS — L820 Inflamed seborrheic keratosis: Secondary | ICD-10-CM | POA: Diagnosis not present

## 2012-10-02 ENCOUNTER — Ambulatory Visit (INDEPENDENT_AMBULATORY_CARE_PROVIDER_SITE_OTHER): Payer: Medicare Other

## 2012-10-02 DIAGNOSIS — Z23 Encounter for immunization: Secondary | ICD-10-CM | POA: Diagnosis not present

## 2012-10-06 ENCOUNTER — Telehealth: Payer: Self-pay | Admitting: Family Medicine

## 2012-10-06 NOTE — Telephone Encounter (Signed)
Noted. Agree.

## 2012-10-06 NOTE — Telephone Encounter (Signed)
Caller: Edison/Patient; Patient Name: Kimberly Davies; PCP: Eustaquio Boyden [facility]); Best Callback Phone Number: [phone].  Patient had flu shot on Monday 10/02/12 in the Right arm at the office. She noticed that her arm does not feel normal.  Feels a puffiness in right axillary area. The area is not painful, but does not feel normal.  Afebrile.  Bra area rubbing the area which is uncomfortable.  No heat noted or redness.  Injection site is normal. Emergent s/sx ruled out per Swollen Lymph nodes with the exception to "Recent Vaccinations /Immunizations in last 1-2 days and new onset of swollen glands". See provider in 72 hours.  HOme care advice and call back parameters per guideline reviewed. Patient verbalized understanding. Declined appt today will wait until Monday. Encouraged to call back for questions, changes or concerns.

## 2012-12-26 ENCOUNTER — Other Ambulatory Visit: Payer: Self-pay | Admitting: Family Medicine

## 2012-12-26 DIAGNOSIS — Z1231 Encounter for screening mammogram for malignant neoplasm of breast: Secondary | ICD-10-CM

## 2013-02-05 ENCOUNTER — Ambulatory Visit
Admission: RE | Admit: 2013-02-05 | Discharge: 2013-02-05 | Disposition: A | Discharge status: Home / Self Care | Payer: Medicare Other | Source: Ambulatory Visit | Attending: Family Medicine | Admitting: Family Medicine

## 2013-02-05 DIAGNOSIS — Z1231 Encounter for screening mammogram for malignant neoplasm of breast: Secondary | ICD-10-CM | POA: Diagnosis not present

## 2013-02-06 ENCOUNTER — Encounter: Payer: Self-pay | Admitting: *Deleted

## 2013-04-09 ENCOUNTER — Ambulatory Visit (INDEPENDENT_AMBULATORY_CARE_PROVIDER_SITE_OTHER): Payer: Medicare Other | Admitting: Family Medicine

## 2013-04-09 ENCOUNTER — Encounter: Payer: Self-pay | Admitting: Family Medicine

## 2013-04-09 VITALS — BP 110/70 | HR 72 | Temp 98.1°F | Wt 114.5 lb

## 2013-04-09 DIAGNOSIS — M5430 Sciatica, unspecified side: Secondary | ICD-10-CM | POA: Diagnosis not present

## 2013-04-09 DIAGNOSIS — M5432 Sciatica, left side: Secondary | ICD-10-CM | POA: Insufficient documentation

## 2013-04-09 MED ORDER — CYCLOBENZAPRINE HCL 5 MG PO TABS: 5.0000 mg | ORAL_TABLET | Freq: Two times a day (BID) | ORAL | Status: DC | PRN

## 2013-04-09 MED ORDER — NAPROXEN 500 MG PO TABS: ORAL_TABLET | ORAL | Status: DC
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
## 2013-04-09 NOTE — Assessment & Plan Note (Signed)
Anticipate L sciatica. Discussed cause as well as treatment options. Will avoid prednisone given h/o osteoporosis. Pt desires to avoid narcotics. Will treat with naprosyn, flexeril (sedation precautions discussed), and stretching exercise from [facility] pt advisor. If persistent, refer to PT, discussed possible need for xrays in future. No red flags today.

## 2013-04-09 NOTE — Patient Instructions (Signed)
I think you have sciatica. Treat with prescription strength anti inflammatory twice daily with food for 3-5 days and then as needed. Use muscle relaxant as well. Do stretching exercises provided today. If not better with this - let me know for physical therapy.

## 2013-05-10 ENCOUNTER — Ambulatory Visit: Payer: Medicare Other | Admitting: Family Medicine

## 2013-05-11 ENCOUNTER — Encounter: Payer: Self-pay | Admitting: Family Medicine

## 2013-05-11 ENCOUNTER — Ambulatory Visit (INDEPENDENT_AMBULATORY_CARE_PROVIDER_SITE_OTHER): Payer: Medicare Other | Admitting: Family Medicine

## 2013-05-11 VITALS — BP 104/70 | HR 88 | Temp 98.1°F | Wt 113.0 lb

## 2013-05-11 DIAGNOSIS — N390 Urinary tract infection, site not specified: Secondary | ICD-10-CM | POA: Insufficient documentation

## 2013-05-11 DIAGNOSIS — R300 Dysuria: Secondary | ICD-10-CM | POA: Diagnosis not present

## 2013-05-11 LAB — POCT URINALYSIS DIPSTICK
Bilirubin, UA: NEGATIVE
Glucose, UA: NEGATIVE
Ketones, UA: NEGATIVE
Spec Grav, UA: 1.015

## 2013-05-11 MED ORDER — CIPROFLOXACIN HCL 250 MG PO TABS: 250.0000 mg | ORAL_TABLET | Freq: Two times a day (BID) | ORAL | Status: DC

## 2013-05-11 NOTE — Progress Notes (Signed)
  Subjective:    Patient ID: Kimberly Davies, female    DOB: 1941/08/09, 72 y.o.   MRN: [id number]  HPI CC: ?UTI  1.5 wk h/o strong urine odor and then progressed to dysuria for the last week.  + urgency, frequency, incomplete emptying, hematuria.  + nausea.  No fevers/chills, vomiting, flank pain.  No recent UTI or abx use.  Thinks has some constipation with abd discomfort - normally takes OTC stool softener when she needs to.  Last normal stool yesterday.  Has taken metamucil in the past. Staying well hydrated with good water intake.  Good fiber in diet.  Past Medical History  Diagnosis Date  . Hyperlipidemia 05/2003  . Osteoporosis 07/2011    T -3.1 Lspine, rec rpt 2 yrs, took actonel for 5 yrs     Review of Systems Per HPI    Objective:   Physical Exam  Nursing note and vitals reviewed. Constitutional: She appears well-developed and well-nourished. No distress.  Abdominal: Soft. Normal appearance and bowel sounds are normal. She exhibits no distension and no mass. There is no hepatosplenomegaly. There is no tenderness. There is no rebound, no guarding, no CVA tenderness and negative Murphy's sign.       Assessment & Plan:

## 2013-05-11 NOTE — Patient Instructions (Signed)
You do have urinary infection - treat with cipro twice daily for 3 days. Update [place] if symptoms persist. Push fluids and plenty of rest. Use tylenol for discomfort as needed.  Urinary Tract Infection Urinary tract infections (UTIs) can develop anywhere along your urinary tract. Your urinary tract is your body's drainage system for removing wastes and extra water. Your urinary tract includes two kidneys, two ureters, a bladder, and a urethra. Your kidneys are a pair of bean-shaped organs. Each kidney is about the size of your fist. They are located below your ribs, one on each side of your spine. CAUSES Infections are caused by microbes, which are microscopic organisms, including fungi, viruses, and bacteria. These organisms are so small that they can only be seen through a microscope. Bacteria are the microbes that most commonly cause UTIs. SYMPTOMS  Symptoms of UTIs may vary by age and gender of the patient and by the location of the infection. Symptoms in young women typically include a frequent and intense urge to urinate and a painful, burning feeling in the bladder or urethra during urination. Older women and men are more likely to be tired, shaky, and weak and have muscle aches and abdominal pain. A fever may mean the infection is in your kidneys. Other symptoms of a kidney infection include pain in your back or sides below the ribs, nausea, and vomiting. DIAGNOSIS To diagnose a UTI, your caregiver will ask you about your symptoms. Your caregiver also will ask to provide a urine sample. The urine sample will be tested for bacteria and white blood cells. White blood cells are made by your body to help fight infection. TREATMENT  Typically, UTIs can be treated with medication. Because most UTIs are caused by a bacterial infection, they usually can be treated with the use of antibiotics. The choice of antibiotic and length of treatment depend on your symptoms and the type of bacteria causing your  infection. HOME CARE INSTRUCTIONS  If you were prescribed antibiotics, take them exactly as your caregiver instructs you. Finish the medication even if you feel better after you have only taken some of the medication.  Drink enough water and fluids to keep your urine clear or pale yellow.  Avoid caffeine, tea, and carbonated beverages. They tend to irritate your bladder.  Empty your bladder often. Avoid holding urine for long periods of time.  Empty your bladder before and after sexual intercourse.  After a bowel movement, women should cleanse from front to back. Use each tissue only once. SEEK MEDICAL CARE IF:   You have back pain.  You develop a fever.  Your symptoms do not begin to resolve within 3 days. SEEK IMMEDIATE MEDICAL CARE IF:   You have severe back pain or lower abdominal pain.  You develop chills.  You have nausea or vomiting.  You have continued burning or discomfort with urination. MAKE SURE YOU:   Understand these instructions.  Will watch your condition.  Will get help right away if you are not doing well or get worse. Document Released: 09/01/2005 Document Revised: 05/23/2012 Document Reviewed: 12/31/2011 [facility] Patient Information 2014 [place], [place].

## 2013-05-11 NOTE — Assessment & Plan Note (Signed)
Story and UA/micro consistent with UTI - anticipate cystitis. Treat with cipro 250mg  twice daily for 3 days. Update [place] if sxs persist. Pt agrees with plan.

## 2013-05-16 ENCOUNTER — Encounter: Payer: Self-pay | Admitting: Gastroenterology

## 2013-06-05 HISTORY — PX: OTHER SURGICAL HISTORY: SHX169

## 2013-06-19 ENCOUNTER — Other Ambulatory Visit: Payer: Self-pay | Admitting: Family Medicine

## 2013-06-19 DIAGNOSIS — M810 Age-related osteoporosis without current pathological fracture: Secondary | ICD-10-CM

## 2013-06-19 DIAGNOSIS — E538 Deficiency of other specified B group vitamins: Secondary | ICD-10-CM

## 2013-06-19 DIAGNOSIS — E785 Hyperlipidemia, unspecified: Secondary | ICD-10-CM

## 2013-06-20 ENCOUNTER — Other Ambulatory Visit (INDEPENDENT_AMBULATORY_CARE_PROVIDER_SITE_OTHER): Payer: Medicare Other

## 2013-06-20 DIAGNOSIS — E538 Deficiency of other specified B group vitamins: Secondary | ICD-10-CM | POA: Diagnosis not present

## 2013-06-20 DIAGNOSIS — E785 Hyperlipidemia, unspecified: Secondary | ICD-10-CM

## 2013-06-20 DIAGNOSIS — D539 Nutritional anemia, unspecified: Secondary | ICD-10-CM

## 2013-06-20 DIAGNOSIS — Z0000 Encounter for general adult medical examination without abnormal findings: Secondary | ICD-10-CM

## 2013-06-20 LAB — LIPID PANEL
Cholesterol: 186 mg/dL (ref 0–200)
VLDL: 20.2 mg/dL (ref 0.0–40.0)

## 2013-06-20 LAB — BASIC METABOLIC PANEL
BUN: 15 mg/dL (ref 6–23)
Calcium: 9.7 mg/dL (ref 8.4–10.5)
Creatinine, Ser: 0.7 mg/dL (ref 0.4–1.2)
GFR: 83.88 mL/min (ref >60.00)
Glucose, Bld: 76 mg/dL (ref 70–99)

## 2013-06-20 LAB — VITAMIN B12: Vitamin B-12: 424 pg/mL (ref 211–911)

## 2013-06-20 LAB — TSH: TSH: 3.2 u[IU]/mL (ref 0.35–5.50)

## 2013-06-27 ENCOUNTER — Encounter: Payer: Self-pay | Admitting: Family Medicine

## 2013-06-27 ENCOUNTER — Ambulatory Visit (INDEPENDENT_AMBULATORY_CARE_PROVIDER_SITE_OTHER): Payer: Medicare Other | Admitting: Family Medicine

## 2013-06-27 VITALS — BP 116/78 | HR 78 | Temp 98.2°F | Ht 62.0 in | Wt 113.8 lb

## 2013-06-27 DIAGNOSIS — E785 Hyperlipidemia, unspecified: Secondary | ICD-10-CM | POA: Diagnosis not present

## 2013-06-27 DIAGNOSIS — I773 Arterial fibromuscular dysplasia: Secondary | ICD-10-CM | POA: Insufficient documentation

## 2013-06-27 DIAGNOSIS — Z0000 Encounter for general adult medical examination without abnormal findings: Secondary | ICD-10-CM | POA: Diagnosis not present

## 2013-06-27 DIAGNOSIS — M810 Age-related osteoporosis without current pathological fracture: Secondary | ICD-10-CM

## 2013-06-27 DIAGNOSIS — R0989 Other specified symptoms and signs involving the circulatory and respiratory systems: Secondary | ICD-10-CM | POA: Insufficient documentation

## 2013-06-27 DIAGNOSIS — E538 Deficiency of other specified B group vitamins: Secondary | ICD-10-CM | POA: Diagnosis not present

## 2013-06-27 NOTE — Assessment & Plan Note (Signed)
I have personally reviewed the Medicare Annual Wellness questionnaire and have noted 1. The patient's medical and social history 2. Their use of alcohol, tobacco or illicit drugs 3. Their current medications and supplements 4. The patient's functional ability including ADL's, fall risks, home safety risks and hearing or visual impairment. 5. Diet and physical activity 6. Evidence for depression or mood disorders The patients weight, height, BMI have been recorded in the chart.  Hearing and vision has been addressed. I have made referrals, counseling and provided education to the patient based review of the above and I have provided the pt with a written personalized care plan for preventive services. See scanned questionairre. Advanced directives discussed: would want sons to be HCPOAs but has not set up.  Advised to look at packet provided last year.  Reviewed preventative protocols and updated unless pt declined. UTD immunizations Pt will schedule colonoscopy. Breast exam WNL today, mammogram reassuring. Age out of cervical cancer screening.

## 2013-06-27 NOTE — Assessment & Plan Note (Signed)
Stable on intermittent oral supplementation.
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
## 2013-06-27 NOTE — Assessment & Plan Note (Signed)
Competed 7-8 years of bisphosphonate. Consider recheck dexa next year.  Last done 2012.

## 2013-06-27 NOTE — Assessment & Plan Note (Signed)
Check carotid US - ordered today.

## 2013-06-27 NOTE — Assessment & Plan Note (Signed)
Good control on recent checks.  May space out checks.

## 2013-06-27 NOTE — Patient Instructions (Addendum)
Bring me medicare wellness form. Pass by Linda's office to set up carotid ultrasound. Good to see you today, return as needed or in 1 year for next medicare wellness visit.

## 2013-06-28 ENCOUNTER — Encounter (INDEPENDENT_AMBULATORY_CARE_PROVIDER_SITE_OTHER): Payer: Medicare Other

## 2013-06-28 DIAGNOSIS — R0989 Other specified symptoms and signs involving the circulatory and respiratory systems: Secondary | ICD-10-CM | POA: Diagnosis not present

## 2013-07-02 ENCOUNTER — Encounter: Payer: Self-pay | Admitting: *Deleted

## 2013-07-02 ENCOUNTER — Encounter: Payer: Self-pay | Admitting: Family Medicine

## 2013-07-03 ENCOUNTER — Encounter: Payer: Self-pay | Admitting: Gastroenterology

## 2013-07-16 ENCOUNTER — Ambulatory Visit (AMBULATORY_SURGERY_CENTER): Payer: Medicare Other

## 2013-07-16 VITALS — Ht 63.0 in | Wt 113.4 lb

## 2013-07-16 DIAGNOSIS — Z860100 Personal history of colon polyps, unspecified: Secondary | ICD-10-CM

## 2013-07-16 MED ORDER — MOVIPREP 100 G PO SOLR: 1.0000 | Freq: Once | ORAL | Status: DC

## 2013-08-06 HISTORY — PX: COLONOSCOPY: SHX174

## 2013-08-07 ENCOUNTER — Ambulatory Visit (AMBULATORY_SURGERY_CENTER): Payer: Medicare Other | Admitting: Gastroenterology

## 2013-08-07 ENCOUNTER — Encounter: Payer: Self-pay | Admitting: Gastroenterology

## 2013-08-07 VITALS — BP 91/52 | HR 63 | Temp 96.4°F | Resp 16 | Ht 63.0 in | Wt 113.0 lb

## 2013-08-07 DIAGNOSIS — Z86010 Personal history of colonic polyps: Secondary | ICD-10-CM

## 2013-08-07 DIAGNOSIS — K644 Residual hemorrhoidal skin tags: Secondary | ICD-10-CM

## 2013-08-07 DIAGNOSIS — K5730 Diverticulosis of large intestine without perforation or abscess without bleeding: Secondary | ICD-10-CM

## 2013-08-07 MED ORDER — SODIUM CHLORIDE 0.9 % IV SOLN: 500.0000 mL | INTRAVENOUS | Status: DC

## 2013-08-07 NOTE — Op Note (Signed)
[facility] [address] [place], [address]   COLONOSCOPY PROCEDURE REPORT  PATIENT: Kimberly, Davies  MR#: [id number] BIRTHDATE: 05-Apr-1941 , 71  yrs. old GENDER: Female ENDOSCOPIST: Rachael Fee, MD PROCEDURE DATE:  08/07/2013 PROCEDURE:   Colonoscopy, surveillance First Screening Colonoscopy - Avg.  risk and is 50 yrs.  old or older - No.  Prior Negative Screening - Now for repeat screening. N/A  History of Adenoma - Now for follow-up colonoscopy & has been > or = to 3 yrs.  Yes hx of adenoma.  Has been 3 or more years since last colonoscopy.  Polyps Removed Today? No.  Recommend repeat exam, <10 yrs? No. ASA CLASS:   Class III INDICATIONS:2-43mm adenom removed 2009. MEDICATIONS: Fentanyl 50 mcg IV, Versed 5 mg IV, and These medications were titrated to patient response per physician's verbal order  DESCRIPTION OF PROCEDURE:   After the risks benefits and alternatives of the procedure were thoroughly explained, informed consent was obtained.  A digital rectal exam revealed no abnormalities of the rectum.   The LB [id number]  endoscope was introduced through the anus and advanced to the cecum, which was identified by both the appendix and ileocecal valve. No adverse events experienced.   The quality of the prep was good, using MoviPrep  The instrument was then slowly withdrawn as the colon was fully examined.   COLON FINDINGS: There were several small diverticulum in the left colon.  There were small internal and external hemorrhoids.  The examination was otherwise normal.  Retroflexed views revealed no abnormalities. The time to cecum=3 minutes 38 seconds.  Withdrawal time=8 minutes 02 seconds.  The scope was withdrawn and the procedure completed. COMPLICATIONS: There were no complications.  ENDOSCOPIC IMPRESSION: There were several small diverticulum in the left colon. There were small internal and external hemorrhoids. The examination was  otherwise normal.  No polyps or cancers  RECOMMENDATIONS: You should continue to follow colorectal cancer screening guidelines for "routine risk" patients with a repeat colonoscopy in 10 years.    eSigned:  Rachael Fee, MD 08/07/2013 9:24 AM   cc: Eustaquio Boyden MD

## 2013-08-07 NOTE — Patient Instructions (Addendum)
Discharge instructions given with verbal understanding. Handouts on diverticulosis and hemorrhoids given. Resume previous medications. YOU HAD AN ENDOSCOPIC PROCEDURE TODAY AT THE [facility]: Refer to the procedure report that was given to you for any specific questions about what was found during the examination.  If the procedure report does not answer your questions, please call your gastroenterologist to clarify.  If you requested that your care partner not be given the details of your procedure findings, then the procedure report has been included in a sealed envelope for you to review at your convenience later.  YOU SHOULD EXPECT: Some feelings of bloating in the abdomen. Passage of more gas than usual.  Walking can help get rid of the air that was put into your GI tract during the procedure and reduce the bloating. If you had a lower endoscopy (such as a colonoscopy or flexible sigmoidoscopy) you may notice spotting of blood in your stool or on the toilet paper. If you underwent a bowel prep for your procedure, then you may not have a normal bowel movement for a few days.  DIET: Your first meal following the procedure should be a light meal and then it is ok to progress to your normal diet.  A half-sandwich or bowl of soup is an example of a good first meal.  Heavy or fried foods are harder to digest and may make you feel nauseous or bloated.  Likewise meals heavy in dairy and vegetables can cause extra gas to form and this can also increase the bloating.  Drink plenty of fluids but you should avoid alcoholic beverages for 24 hours.  ACTIVITY: Your care partner should take you home directly after the procedure.  You should plan to take it easy, moving slowly for the rest of the day.  You can resume normal activity the day after the procedure however you should NOT DRIVE or use heavy machinery for 24 hours (because of the sedation medicines used during the test).    SYMPTOMS TO REPORT  IMMEDIATELY: A gastroenterologist can be reached at any hour.  During normal business hours, 8:30 AM to 5:00 PM Monday through Friday, call [phone].  After hours and on weekends, please call the GI answering service at [phone] who will take a message and have the physician on call contact you.   Following lower endoscopy (colonoscopy or flexible sigmoidoscopy):  Excessive amounts of blood in the stool  Significant tenderness or worsening of abdominal pains  Swelling of the abdomen that is new, acute  Fever of 100F or higher  FOLLOW UP: If any biopsies were taken you will be contacted by phone or by letter within the next 1-3 weeks.  Call your gastroenterologist if you have not heard about the biopsies in 3 weeks.  Our staff will call the home number listed on your records the next business day following your procedure to check on you and address any questions or concerns that you may have at that time regarding the information given to you following your procedure. This is a courtesy call and so if there is no answer at the home number and we have not heard from you through the emergency physician on call, we will assume that you have returned to your regular daily activities without incident.  SIGNATURES/CONFIDENTIALITY: You and/or your care partner have signed paperwork which will be entered into your electronic medical record.  These signatures attest to the fact that that the information above on your After Visit   Summary has been reviewed and is understood.  Full responsibility of the confidentiality of this discharge information lies with you and/or your care-partner. 

## 2013-08-07 NOTE — Progress Notes (Signed)
Patient did not experience any of the following events: a burn prior to discharge; a fall within the facility; wrong site/side/patient/procedure/implant event; or a hospital transfer or hospital admission upon discharge from the facility. (G8907) Patient did not have preoperative order for IV antibiotic SSI prophylaxis. (G8918)  

## 2013-08-07 NOTE — Progress Notes (Signed)
Patient stating 5 years ago after the Colonoscopy, her BP "bottomed out." She wants [place] to be sure and watch her BP closely. Reassured the patient that she will have close monitoring of all of her vital signs. Patient unsure of her baseline BP. Today, 121/79.

## 2013-08-08 ENCOUNTER — Telehealth: Payer: Self-pay | Admitting: *Deleted

## 2013-08-08 ENCOUNTER — Encounter: Payer: Self-pay | Admitting: Family Medicine

## 2013-08-08 NOTE — Telephone Encounter (Signed)
  Follow up Call-  Call back number 08/07/2013  Post procedure Call Back phone  # [phone]  Permission to leave phone message Yes     Patient questions:  Do you have a fever, pain , or abdominal swelling? no Pain Score  0 *  Have you tolerated food without any problems? yes  Have you been able to return to your normal activities? yes  Do you have any questions about your discharge instructions: Diet   no Medications  no Follow up visit  no  Do you have questions or concerns about your Care? no  Actions: * If pain score is 4 or above: No action needed, pain <4.

## 2013-09-11 ENCOUNTER — Ambulatory Visit (INDEPENDENT_AMBULATORY_CARE_PROVIDER_SITE_OTHER): Payer: Medicare Other

## 2013-09-11 DIAGNOSIS — Z23 Encounter for immunization: Secondary | ICD-10-CM

## 2013-10-11 ENCOUNTER — Telehealth: Payer: Self-pay

## 2013-10-11 NOTE — Telephone Encounter (Signed)
Pt wants to know if our office excepts a new ins.co. Thru [employer]. Pt will call [phone] and if still questions will ck with the ins co to see if affilitated with our office.

## 2013-12-26 DIAGNOSIS — H524 Presbyopia: Secondary | ICD-10-CM | POA: Diagnosis not present

## 2013-12-26 DIAGNOSIS — H43819 Vitreous degeneration, unspecified eye: Secondary | ICD-10-CM | POA: Diagnosis not present

## 2013-12-26 DIAGNOSIS — H2510 Age-related nuclear cataract, unspecified eye: Secondary | ICD-10-CM | POA: Diagnosis not present

## 2014-01-23 ENCOUNTER — Other Ambulatory Visit: Payer: Self-pay

## 2014-01-23 DIAGNOSIS — Z1231 Encounter for screening mammogram for malignant neoplasm of breast: Secondary | ICD-10-CM

## 2014-01-28 ENCOUNTER — Telehealth: Payer: Self-pay | Admitting: Family Medicine

## 2014-01-28 DIAGNOSIS — M810 Age-related osteoporosis without current pathological fracture: Secondary | ICD-10-CM

## 2014-01-28 NOTE — Telephone Encounter (Signed)
Patient notified

## 2014-01-28 NOTE — Telephone Encounter (Signed)
order placed.

## 2014-01-28 NOTE — Telephone Encounter (Signed)
Patient is set up for MMG at 02/13/14 at 8:20am at [facility], she is due for a Bone Density now and would like to go on the same day for both. Please put the order in for the Bone Density and call the patient to let her know that the order is in.

## 2014-02-03 HISTORY — PX: OTHER SURGICAL HISTORY: SHX169

## 2014-02-13 ENCOUNTER — Ambulatory Visit: Payer: Medicare Other

## 2014-02-19 ENCOUNTER — Ambulatory Visit
Admission: RE | Admit: 2014-02-19 | Discharge: 2014-02-19 | Disposition: A | Discharge status: Home / Self Care | Payer: Medicare Other | Source: Ambulatory Visit

## 2014-02-19 ENCOUNTER — Ambulatory Visit
Admission: RE | Admit: 2014-02-19 | Discharge: 2014-02-19 | Disposition: A | Discharge status: Home / Self Care | Payer: Medicare Other | Source: Ambulatory Visit | Attending: Family Medicine | Admitting: Family Medicine

## 2014-02-19 DIAGNOSIS — Z1231 Encounter for screening mammogram for malignant neoplasm of breast: Secondary | ICD-10-CM

## 2014-02-19 DIAGNOSIS — M810 Age-related osteoporosis without current pathological fracture: Secondary | ICD-10-CM | POA: Diagnosis not present

## 2014-02-19 LAB — HM MAMMOGRAPHY: HM Mammogram: NORMAL

## 2014-02-20 ENCOUNTER — Encounter: Payer: Self-pay | Admitting: *Deleted

## 2014-02-21 ENCOUNTER — Encounter: Payer: Self-pay | Admitting: *Deleted

## 2014-02-21 ENCOUNTER — Encounter: Payer: Self-pay | Admitting: Family Medicine

## 2014-06-22 ENCOUNTER — Other Ambulatory Visit: Payer: Self-pay | Admitting: Family Medicine

## 2014-06-22 DIAGNOSIS — E559 Vitamin D deficiency, unspecified: Secondary | ICD-10-CM

## 2014-06-22 DIAGNOSIS — E785 Hyperlipidemia, unspecified: Secondary | ICD-10-CM

## 2014-06-24 ENCOUNTER — Other Ambulatory Visit (INDEPENDENT_AMBULATORY_CARE_PROVIDER_SITE_OTHER): Payer: Medicare Other

## 2014-06-24 DIAGNOSIS — E785 Hyperlipidemia, unspecified: Secondary | ICD-10-CM | POA: Diagnosis not present

## 2014-06-24 DIAGNOSIS — E559 Vitamin D deficiency, unspecified: Secondary | ICD-10-CM | POA: Diagnosis not present

## 2014-06-24 LAB — LIPID PANEL
CHOLESTEROL: 178 mg/dL (ref 0–200)
HDL: 66 mg/dL (ref >39.00)
LDL Cholesterol: 100 mg/dL — ABNORMAL HIGH (ref 0–99)
NonHDL: 112
Total CHOL/HDL Ratio: 3
Triglycerides: 59 mg/dL (ref 0.0–149.0)
VLDL: 11.8 mg/dL (ref 0.0–40.0)

## 2014-06-24 LAB — BASIC METABOLIC PANEL
BUN: 12 mg/dL (ref 6–23)
CALCIUM: 9.3 mg/dL (ref 8.4–10.5)
CO2: 27 mEq/L (ref 19–32)
Chloride: 106 mEq/L (ref 96–112)
Creatinine, Ser: 0.7 mg/dL (ref 0.4–1.2)
GFR: 82.33 mL/min (ref >60.00)
GLUCOSE: 82 mg/dL (ref 70–99)
Potassium: 3.7 mEq/L (ref 3.5–5.1)
Sodium: 141 mEq/L (ref 135–145)

## 2014-06-24 LAB — VITAMIN D 25 HYDROXY (VIT D DEFICIENCY, FRACTURES): VITD: 35.49 ng/mL

## 2014-06-24 LAB — TSH: TSH: 3.46 u[IU]/mL (ref 0.35–4.50)

## 2014-07-01 ENCOUNTER — Encounter: Payer: Self-pay | Admitting: Family Medicine

## 2014-07-01 ENCOUNTER — Ambulatory Visit (INDEPENDENT_AMBULATORY_CARE_PROVIDER_SITE_OTHER): Payer: Medicare Other | Admitting: Family Medicine

## 2014-07-01 VITALS — BP 110/70 | HR 76 | Temp 98.2°F | Ht 62.25 in | Wt 108.8 lb

## 2014-07-01 DIAGNOSIS — E785 Hyperlipidemia, unspecified: Secondary | ICD-10-CM | POA: Diagnosis not present

## 2014-07-01 DIAGNOSIS — Z0000 Encounter for general adult medical examination without abnormal findings: Secondary | ICD-10-CM

## 2014-07-01 DIAGNOSIS — Z23 Encounter for immunization: Secondary | ICD-10-CM | POA: Diagnosis not present

## 2014-07-01 DIAGNOSIS — M810 Age-related osteoporosis without current pathological fracture: Secondary | ICD-10-CM

## 2014-07-01 MED ORDER — ALENDRONATE SODIUM 35 MG PO TABS: 35.0000 mg | ORAL_TABLET | ORAL | Status: DC

## 2014-07-01 NOTE — Addendum Note (Signed)
Addended by: Emelia Salisbury C on: 07/01/2014 10:23 AM   Modules accepted: Orders

## 2014-07-01 NOTE — Progress Notes (Signed)
Pre visit review using our clinic review tool, if applicable. No additional management support is needed unless otherwise documented below in the visit note. 

## 2014-07-01 NOTE — Progress Notes (Signed)
BP 110/70  Pulse 76  Temp(Src) 98.2 F (36.8 C) (Oral)  Ht 5' 2.25" (1.581 m)  Wt 108 lb 12 oz (49.329 kg)  BMI 19.74 kg/m2   CC: medicare wellness visit  Subjective:    Patient ID: Kimberly Davies, female    DOB: 1941-05-06, 73 y.o.   MRN: [id number]  HPI: Kimberly Davies is a 73 y.o. female presenting on 07/01/2014 for Annual Exam   Fmhx lung cancer, leukemia, and bladder cancer. Pt never smoker. New grandson. Helped son move into new house in [place] area.  Osteoporosis - Dexa Date: 02/2014 T score -3.1 at spine and -2.8 at hip. Has been on actonel >5 yrs in the past. Has been off this med in past Interested in restarting actonel.   Passes hearing/vision screens today.  No falls in last year.  No depression/anhedonia, sadness.   Preventative: COLONOSCOPY Date: 08/2013 small diverticula o/w normal, rpt 10 yrs Ardis Hughs)  Well woman exam - no fmhx cervical cancer.  mammo - 02/2014 birads 1 Dexa Date: 02/2014 T score -3.1 at spine and -2.8 at hip Flu shot yearly  Pneumovax 2011, prevnar today Shingles 2012 completed Tetanus 2009. Advanced directives: Has info at home. Has not filled out. Would want sons to be HCPOA.  Caffeine: none Lives with husband, 2 grown children. 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  Allergies and medications reviewed and updated. Current Outpatient Prescriptions on File Prior to Visit  Medication Sig  . Calcium Carb-Cholecalciferol (CALCIUM-VITAMIN D) 600-400 MG-UNIT TABS Take 1 tablet by mouth 2 (two) times daily.   No current facility-administered medications on file prior to visit.    Review of Systems Per HPI unless specifically indicated above    Objective:    BP 110/70  Pulse 76  Temp(Src) 98.2 F (36.8 C) (Oral)  Ht 5' 2.25" (1.581 m)   Wt 108 lb 12 oz (49.329 kg)  BMI 19.74 kg/m2  Physical Exam  Nursing note and vitals reviewed. Constitutional: She is oriented to person, place, and time. She appears well-developed and well-nourished. No distress.  HENT:  Head: Normocephalic and atraumatic.  Right Ear: Hearing, tympanic membrane, external ear and ear canal normal.  Left Ear: Hearing, tympanic membrane, external ear and ear canal normal.  Nose: Nose normal.  Mouth/Throat: Uvula is midline, oropharynx is clear and moist and mucous membranes are normal. No oropharyngeal exudate, posterior oropharyngeal edema or posterior oropharyngeal erythema.  Eyes: Conjunctivae and EOM are normal. Pupils are equal, round, and reactive to light. No scleral icterus.  Neck: Normal range of motion. Neck supple. Carotid bruit is not present. No thyromegaly present.  Cardiovascular: Normal rate, regular rhythm, normal heart sounds and intact distal pulses.   No murmur heard. Pulses:      Radial pulses are 2+ on the right side, and 2+ on the left side.  Pulmonary/Chest: Effort normal and breath sounds normal. No respiratory distress. She has no wheezes. She has no rales.  Abdominal: Soft. Bowel sounds are normal. She exhibits no distension and no mass. There is no tenderness. There is no rebound and no guarding.  Musculoskeletal: Normal range of motion. She exhibits no edema.  Lymphadenopathy:    She has no cervical adenopathy.  Neurological: She is alert and oriented to person, place, and time.  CN grossly intact, station and  gait intact Recall 3/3 Calculation 3/5 serial 7s, D-L-R-O-W 5/5  Skin: Skin is warm and dry. No rash noted.  Psychiatric: She has a normal mood and affect. Her behavior is normal. Judgment and thought content normal.   Results for orders placed in visit on 06/24/14  LIPID PANEL      Result Value Ref Range   Cholesterol 178  0 - 200 mg/dL   Triglycerides 59.0  0.0 - 149.0 mg/dL   HDL 66.00  >39.00 mg/dL   VLDL 11.8   0.0 - 40.0 mg/dL   LDL Cholesterol 100 (*) 0 - 99 mg/dL   Total CHOL/HDL Ratio 3     NonHDL 885.02    BASIC METABOLIC PANEL      Result Value Ref Range   Sodium 141  135 - 145 mEq/L   Potassium 3.7  3.5 - 5.1 mEq/L   Chloride 106  96 - 112 mEq/L   CO2 27  19 - 32 mEq/L   Glucose, Bld 82  70 - 99 mg/dL   BUN 12  6 - 23 mg/dL   Creatinine, Ser 0.7  0.4 - 1.2 mg/dL   Calcium 9.3  8.4 - 10.5 mg/dL   GFR 82.33  >60.00 mL/min  VITAMIN D 25 HYDROXY      Result Value Ref Range   VITD 35.49    TSH      Result Value Ref Range   TSH 3.46  0.35 - 4.50 uIU/mL      Assessment & Plan:   Problem List Items Addressed This Visit   OSTEOPOROSIS     Completed 7-8 yrs bisphosphonate. Last DEXA 2015 with persistent osteoporosis -3.1. Reviewed different OP tx options and their risks/benefts including estrogen, SERMs, forteo, bisphosphonates (oral and IV) and prolia. Pt requests to restart actonel, prior on 35mg  weekly dose. Reviewed how to take med as well as possible adverse reactions. Advised start after upcoming planned dental work and to notify dentist she will restart this medication.    Relevant Medications      alendronate (FOSAMAX) 35 MG tablet   Medicare annual wellness visit, subsequent - Primary     I have personally reviewed the Medicare Annual Wellness questionnaire and have noted 1. The patient's medical and social history 2. Their use of alcohol, tobacco or illicit drugs 3. Their current medications and supplements 4. The patient's functional ability including ADL's, fall risks, home safety risks and hearing or visual impairment. 5. Diet and physical activity 6. Evidence for depression or mood disorders The patients weight, height, BMI have been recorded in the chart.  Hearing and vision has been addressed. I have made referrals, counseling and provided education to the patient based review of the above and I have provided the pt with a written personalized care plan for  preventive services. Provider list updated - see scanned questionairre. Advanced directives discussed: does not have set up. Would want sons to be HCPOA.  Reviewed preventative protocols and updated unless pt declined.    HYPERLIPIDEMIA     Chronic, diet controlled.        Follow up plan: Return in about 18 months (around 01/02/2016), or as needed, for medicare wellness.

## 2014-07-01 NOTE — Assessment & Plan Note (Signed)
Completed 7-8 yrs bisphosphonate. Last DEXA 2015 with persistent osteoporosis -3.1. Reviewed different OP tx options and their risks/benefts including estrogen, SERMs, forteo, bisphosphonates (oral and IV) and prolia. Pt requests to restart actonel, prior on 35mg  weekly dose. Reviewed how to take med as well as possible adverse reactions. Advised start after upcoming planned dental work and to notify dentist she will restart this medication.

## 2014-07-01 NOTE — Assessment & Plan Note (Signed)
Chronic, diet controlled. 

## 2014-07-01 NOTE — Patient Instructions (Signed)
Let's restart actonel (sent to pharmacy). Continue calcium /vit D as tolerated. prevnar today. Good to see you today, call us with questions. Return in 1.5 - 2 years for next wellness exam.

## 2014-07-01 NOTE — Assessment & Plan Note (Signed)
I have personally reviewed the Medicare Annual Wellness questionnaire and have noted 1. The patient's medical and social history 2. Their use of alcohol, tobacco or illicit drugs 3. Their current medications and supplements 4. The patient's functional ability including ADL's, fall risks, home safety risks and hearing or visual impairment. 5. Diet and physical activity 6. Evidence for depression or mood disorders The patients weight, height, BMI have been recorded in the chart.  Hearing and vision has been addressed. I have made referrals, counseling and provided education to the patient based review of the above and I have provided the pt with a written personalized care plan for preventive services. Provider list updated - see scanned questionairre. Advanced directives discussed: does not have set up. Would want sons to be HCPOA.  Reviewed preventative protocols and updated unless pt declined.

## 2014-07-31 ENCOUNTER — Telehealth: Payer: Self-pay | Admitting: Family Medicine

## 2014-07-31 ENCOUNTER — Encounter: Payer: Self-pay | Admitting: Family Medicine

## 2014-07-31 ENCOUNTER — Ambulatory Visit (INDEPENDENT_AMBULATORY_CARE_PROVIDER_SITE_OTHER): Payer: Medicare Other | Admitting: Family Medicine

## 2014-07-31 VITALS — BP 118/82 | HR 80 | Temp 98.2°F | Ht 62.25 in | Wt 110.0 lb

## 2014-07-31 DIAGNOSIS — L259 Unspecified contact dermatitis, unspecified cause: Secondary | ICD-10-CM

## 2014-07-31 MED ORDER — MOMETASONE FUROATE 0.1 % EX CREA: 1.0000 "application " | TOPICAL_CREAM | Freq: Every day | CUTANEOUS | Status: DC

## 2014-07-31 NOTE — Telephone Encounter (Signed)
Patient Information:  Caller Name: Carianne  Phone: [phone]  Patient: Anberlyn, Feimster  Gender: Female  DOB: January 23, 1941  Age: 73 Years  PCP: Ria Bush [facility])  Office Follow Up:  Does the office need to follow up with this patient?: No  Instructions For The Office: N/A   Symptoms  Reason For Call & Symptoms: Problems with washer for 8 weeks, says tense and agitated about this.  Rash in bend of left elbow  noticed after going to laundrymat for 2-3 weeks.  About 3 weeks later, started Abx/Clindamycin on Wed 9/19, had dental surgery/implants on Fri  8/21.  Since then one spot 1/2 penny size on one buttock, pimple type on front of left  knee then  general rash at bend of arm and behind knees gradually worse rash, is itchy.  All of rash does not look the same  Reviewed Health History In EMR: Yes  Reviewed Medications In EMR: Yes  Reviewed Allergies In EMR: Yes  Reviewed Surgeries / Procedures: Yes  Date of Onset of Symptoms: Unknown  Guideline(s) Used:  Rash or Redness - Localized  Disposition Per Guideline:   See Within 3 Days in Office  Reason For Disposition Reached:   Localized rash present > 7 days  Advice Given:  N/A  Patient Will Follow Care Advice:  YES  Appointment Scheduled:  07/31/2014 12:15:00 Appointment Scheduled Provider:  Loura Pardon [facility])
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
## 2014-07-31 NOTE — Assessment & Plan Note (Signed)
Unsure what the cause is  Disc avoiding fragrances and heat  Avoiding pesticides and poison ivy/oak Px elocon cream to use daily Zyrtec for itch prn Update if not starting to improve in a week or if worsening

## 2014-07-31 NOTE — Telephone Encounter (Signed)
I will see her then  

## 2014-07-31 NOTE — Progress Notes (Signed)
Pre visit review using our clinic review tool, if applicable. No additional management support is needed unless otherwise documented below in the visit note. 

## 2014-07-31 NOTE — Patient Instructions (Signed)
I think you have contact dermatitis  Keep cool  Try zyrtec 10 mg daily over the counter for itch  Use elocon cream daily to affected areas  Use unscented products

## 2014-08-27 DIAGNOSIS — L259 Unspecified contact dermatitis, unspecified cause: Secondary | ICD-10-CM | POA: Diagnosis not present

## 2015-01-21 ENCOUNTER — Other Ambulatory Visit: Payer: Self-pay

## 2015-01-21 DIAGNOSIS — Z1231 Encounter for screening mammogram for malignant neoplasm of breast: Secondary | ICD-10-CM

## 2015-02-10 ENCOUNTER — Encounter: Payer: Self-pay | Admitting: Family Medicine

## 2015-02-10 ENCOUNTER — Ambulatory Visit (INDEPENDENT_AMBULATORY_CARE_PROVIDER_SITE_OTHER): Payer: Medicare Other | Admitting: Family Medicine

## 2015-02-10 VITALS — BP 116/68 | HR 83 | Temp 97.8°F | Ht 62.25 in | Wt 105.1 lb

## 2015-02-10 DIAGNOSIS — R0989 Other specified symptoms and signs involving the circulatory and respiratory systems: Secondary | ICD-10-CM | POA: Diagnosis not present

## 2015-02-10 DIAGNOSIS — M542 Cervicalgia: Secondary | ICD-10-CM | POA: Diagnosis not present

## 2015-02-10 DIAGNOSIS — R194 Change in bowel habit: Secondary | ICD-10-CM | POA: Diagnosis not present

## 2015-02-10 DIAGNOSIS — M810 Age-related osteoporosis without current pathological fracture: Secondary | ICD-10-CM | POA: Diagnosis not present

## 2015-02-10 DIAGNOSIS — R42 Dizziness and giddiness: Secondary | ICD-10-CM | POA: Diagnosis not present

## 2015-02-10 MED ORDER — ALENDRONATE SODIUM 35 MG PO TABS: 35.0000 mg | ORAL_TABLET | ORAL | Status: DC

## 2015-02-10 NOTE — Assessment & Plan Note (Signed)
After viral stomach bug. Possibly development of IBS - recommended continued activia for next few weeks and update with effect. No red flags today.

## 2015-02-10 NOTE — Patient Instructions (Addendum)
You are doing well today. I think this is likely some residual from prior stomach bug.  Continue activia daily for next 1-2 weeks.  Should slowly continue to improve. Good to see you today, call us with questions.

## 2015-02-10 NOTE — Progress Notes (Signed)
Pre visit review using our clinic review tool, if applicable. No additional management support is needed unless otherwise documented below in the visit note. 

## 2015-02-10 NOTE — Assessment & Plan Note (Signed)
Anticipate OA related - discussed importance of good posture.

## 2015-02-10 NOTE — Progress Notes (Signed)
BP 116/68 mmHg  Pulse 83  Temp(Src) 97.8 F (36.6 C) (Oral)  Ht 5' 2.25" (1.581 m)  Wt 105 lb 1.9 oz (47.682 kg)  BMI 19.08 kg/m2  SpO2 99%   CC: dizziness  Subjective:    Patient ID: Kimberly Davies, female    DOB: 01-14-1941, 74 y.o.   MRN: [id number]  HPI: Kimberly Davies is a 74 y.o. female presenting on 02/10/2015 for Dizziness   Kept granddaughter early February. GI illness after this (stomach bug). Since then, staying with alternating diarrhea/constipation. Some dizziness prior to this, but it has increased since GI illness. Dizziness described as mildly lightheaded, also with mild vertigo that tends to happen when she's getting ready for bedtime. No presyncope or syncope. Denies significant fevers/chills, nausea. No headaches, shortness of breath, palpitations. Possible h/o IBS. Stays well hydrated. No dizziness when looking up.   Also with worsening cervical neck pain, difficulty with certain positions.   She has started using [place] daily over last few days.   Mother with h/o mini strokes.   OP - has not restarted bisphosponate (alendronate 35mg  weekly). Has completed dental implant, ok'd by dentist to restart bisphosphonate.   Fmhx lung cancer, leukemia, and bladder cancer. Pt never smoker.  COLONOSCOPY Date: 08/2013 small diverticula o/w normal, rpt 10 yrs Ardis Hughs).   H/o carotid bruits bilaterally but ultrasound 06/2013 completely normal.  Relevant past medical, surgical, family and social history reviewed and updated as indicated. Interim medical history since our last visit reviewed. Allergies and medications reviewed and updated. Current Outpatient Prescriptions on File Prior to Visit  Medication Sig  . Calcium Carb-Cholecalciferol (CALCIUM-VITAMIN D) 600-400 MG-UNIT TABS Take 1 tablet by mouth 2 (two) times daily.   No current facility-administered medications on file prior to visit.    Review of Systems Per HPI unless specifically indicated above       Objective:    BP 116/68 mmHg  Pulse 83  Temp(Src) 97.8 F (36.6 C) (Oral)  Ht 5' 2.25" (1.581 m)  Wt 105 lb 1.9 oz (47.682 kg)  BMI 19.08 kg/m2  SpO2 99%  Wt Readings from Last 3 Encounters:  02/10/15 105 lb 1.9 oz (47.682 kg)  07/31/14 110 lb (49.896 kg)  07/01/14 108 lb 12 oz (49.329 kg)    Physical Exam  Constitutional: She is oriented to person, place, and time. She appears well-developed and well-nourished. No distress.  HENT:  Head: Normocephalic and atraumatic.  Mouth/Throat: Oropharynx is clear and moist. No oropharyngeal exudate.  Eyes: Conjunctivae and EOM are normal. Pupils are equal, round, and reactive to light. No scleral icterus.  Neck: Normal range of motion. Neck supple. Carotid bruit is present (bilateral - but normal ultrasounds). No thyromegaly present.  Cardiovascular: Normal rate, regular rhythm, normal heart sounds and intact distal pulses.   No murmur heard. Pulmonary/Chest: Effort normal and breath sounds normal. No respiratory distress. She has no wheezes. She has no rales.  Musculoskeletal: She exhibits no edema.  FROM neck without significant midline spine tenderness  Lymphadenopathy:    She has no cervical adenopathy.  Neurological: She is alert and oriented to person, place, and time. She has normal strength. No cranial nerve deficit or sensory deficit. She displays a negative Romberg sign. Coordination and gait normal.  CN 2-12 intact Station and gait intact FTN intact No pronator drift  Skin: Skin is warm and dry. No rash noted.  Nursing note and vitals reviewed.      Assessment & Plan:   Problem  List Items Addressed This Visit    Osteoporosis    To start bisphosphonate.       Relevant Medications   alendronate (FOSAMAX) 35 MG tablet   Dizziness - Primary    Neurological exam overall normal. No red flags today. Anticipate mild BPPV + dehydration after GI bug but patient seems to be improving daily. Continue to monitor, declines  meclizine.      Cervical pain (neck)    Anticipate OA related - discussed importance of good posture.      Bowel habit changes    After viral stomach bug. Possibly development of IBS - recommended continued activia for next few weeks and update with effect. No red flags today.      Bilateral carotid bruits    S/p normal carotid US 06/2013.          Follow up plan: Return if symptoms worsen or fail to improve.

## 2015-02-10 NOTE — Assessment & Plan Note (Addendum)
S/p normal carotid US 06/2013.

## 2015-02-10 NOTE — Assessment & Plan Note (Signed)
To start bisphosphonate.

## 2015-02-10 NOTE — Assessment & Plan Note (Signed)
Neurological exam overall normal. No red flags today. Anticipate mild BPPV + dehydration after GI bug but patient seems to be improving daily. Continue to monitor, declines meclizine.

## 2015-02-24 ENCOUNTER — Encounter: Payer: Self-pay | Admitting: *Deleted

## 2015-02-24 ENCOUNTER — Ambulatory Visit
Admission: RE | Admit: 2015-02-24 | Discharge: 2015-02-24 | Disposition: A | Discharge status: Home / Self Care | Payer: Medicare Other | Source: Ambulatory Visit

## 2015-02-24 DIAGNOSIS — Z1231 Encounter for screening mammogram for malignant neoplasm of breast: Secondary | ICD-10-CM

## 2015-02-24 LAB — HM MAMMOGRAPHY: HM Mammogram: NORMAL

## 2015-04-16 ENCOUNTER — Encounter: Payer: Self-pay | Admitting: Gastroenterology

## 2016-01-27 ENCOUNTER — Ambulatory Visit (INDEPENDENT_AMBULATORY_CARE_PROVIDER_SITE_OTHER)
Admission: RE | Admit: 2016-01-27 | Discharge: 2016-01-27 | Disposition: A | Discharge status: Home / Self Care | Payer: Medicare Other | Source: Ambulatory Visit | Attending: Family Medicine | Admitting: Family Medicine

## 2016-01-27 ENCOUNTER — Encounter: Payer: Self-pay | Admitting: Family Medicine

## 2016-01-27 ENCOUNTER — Ambulatory Visit (INDEPENDENT_AMBULATORY_CARE_PROVIDER_SITE_OTHER): Payer: Medicare Other | Admitting: Family Medicine

## 2016-01-27 VITALS — BP 100/70 | HR 100 | Temp 97.6°F | Wt 101.5 lb

## 2016-01-27 DIAGNOSIS — M810 Age-related osteoporosis without current pathological fracture: Secondary | ICD-10-CM

## 2016-01-27 DIAGNOSIS — M25512 Pain in left shoulder: Secondary | ICD-10-CM | POA: Diagnosis not present

## 2016-01-27 DIAGNOSIS — S4992XA Unspecified injury of left shoulder and upper arm, initial encounter: Secondary | ICD-10-CM

## 2016-01-27 DIAGNOSIS — S46002A Unspecified injury of muscle(s) and tendon(s) of the rotator cuff of left shoulder, initial encounter: Secondary | ICD-10-CM | POA: Insufficient documentation

## 2016-01-27 MED ORDER — ALENDRONATE SODIUM 70 MG PO TABS: 70.0000 mg | ORAL_TABLET | ORAL | Status: DC

## 2016-01-27 MED ORDER — NAPROXEN 500 MG PO TABS: ORAL_TABLET | ORAL | Status: DC

## 2016-01-27 NOTE — Assessment & Plan Note (Signed)
Check xray today - no fracture or dislocation on my read. Anticipate RTC injury but not full tear - discussed with patient. Offered ortho referral. Will start with conservative treatment with naprosyn 250-500mg  BID with meal x 5 days then PRN. Provided with exercises from [facility] pt advisor and resistance band. Update if not improving with treatment for ortho referral vs MRI
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
## 2016-01-27 NOTE — Progress Notes (Signed)
Pre visit review using our clinic review tool, if applicable. No additional management support is needed unless otherwise documented below in the visit note. 

## 2016-01-27 NOTE — Assessment & Plan Note (Addendum)
She is on preventative dosing of fosamax, unclear why. Once L shoulder pain has improved, will increase dose to 70mg  weekly. Hold fosamax until shoulder improved. Pt agrees

## 2016-01-27 NOTE — Patient Instructions (Addendum)
I do think you have rotator cuff injury after fall. Treat with naprosyn 500mg  once to twice daily with food for 5 days then as needed. Do exercises provided today  Ice to shoulder. If not improving or worsening let me know for referral to orthopedist. Once L shoulder feeling better and when you run out of current dose, may increase fosamax dose to 70mg  weekly - new dose sent to pharmacy.

## 2016-02-20 ENCOUNTER — Telehealth: Payer: Self-pay | Admitting: Family Medicine

## 2016-02-20 DIAGNOSIS — M25512 Pain in left shoulder: Secondary | ICD-10-CM

## 2016-02-20 NOTE — Telephone Encounter (Signed)
Pt called left shoulder is continuing to give her concerns and she would like to follow up on PCP's recommendation to go to orthopedic for possible tear to RTC left shoulder.  Pt would like [facility].  She will come by and pick up xray when ready.

## 2016-02-20 NOTE — Telephone Encounter (Signed)
Referral placed.  Terri, would you mind burning her a CD of her recent xray? Thanks.

## 2016-02-25 DIAGNOSIS — M7502 Adhesive capsulitis of left shoulder: Secondary | ICD-10-CM | POA: Diagnosis not present

## 2016-03-22 ENCOUNTER — Ambulatory Visit (INDEPENDENT_AMBULATORY_CARE_PROVIDER_SITE_OTHER): Payer: Medicare Other

## 2016-03-22 VITALS — BP 102/70 | HR 75 | Temp 97.6°F | Ht 62.0 in | Wt 106.2 lb

## 2016-03-22 DIAGNOSIS — Z0000 Encounter for general adult medical examination without abnormal findings: Secondary | ICD-10-CM | POA: Diagnosis not present

## 2016-03-22 DIAGNOSIS — E559 Vitamin D deficiency, unspecified: Secondary | ICD-10-CM

## 2016-03-22 DIAGNOSIS — E785 Hyperlipidemia, unspecified: Secondary | ICD-10-CM | POA: Diagnosis not present

## 2016-03-22 NOTE — Progress Notes (Signed)
Pre visit review using our clinic review tool, if applicable. No additional management support is needed unless otherwise documented below in the visit note. 

## 2016-03-22 NOTE — Patient Instructions (Addendum)
Kimberly Davies , Thank you for taking time to come for your Medicare Wellness Visit. I appreciate your ongoing commitment to your health goals. Please review the following plan we discussed and let me know if I can assist you in the future.   These are the goals we discussed: Goals    . Increase physical activity     Starting 03/23/2016, I will increase walking from 30 min to 45 min daily 5 days per week.        This is a list of the screening recommended for you and due dates:  Health Maintenance  Topic Date Due  . Mammogram  11/05/2017*  . Flu Shot  07/06/2016  . Tetanus Vaccine  05/13/2018  . Colon Cancer Screening  08/08/2023  . DTaP/Tdap/Td vaccine  Completed  . DEXA scan (bone density measurement)  Completed  . Shingles Vaccine  Completed  . Pneumonia vaccines  Completed  *Topic was postponed. The date shown is not the original due date.    Preventive Care for Adults  A healthy lifestyle and preventive care can promote health and wellness. Preventive health guidelines for adults include the following key practices.  . A routine yearly physical is a good way to check with your health care provider about your health and preventive screening. It is a chance to share any concerns and updates on your health and to receive a thorough exam.  . Visit your dentist for a routine exam and preventive care every 6 months. Brush your teeth twice a day and floss once a day. Good oral hygiene prevents tooth decay and gum disease.  . The frequency of eye exams is based on your age, health, family medical history, use  of contact lenses, and other factors. Follow your health care provider's ecommendations for frequency of eye exams.  . Eat a healthy diet. Foods like vegetables, fruits, whole grains, low-fat dairy products, and lean protein foods contain the nutrients you need without too many calories. Decrease your intake of foods high in solid fats, added sugars, and salt. Eat the right amount  of calories for you. Get information about a proper diet from your health care provider, if necessary.  . Regular physical exercise is one of the most important things you can do for your health. Most adults should get at least 150 minutes of moderate-intensity exercise (any activity that increases your heart rate and causes you to sweat) each week. In addition, most adults need muscle-strengthening exercises on 2 or more days a week.  Silver Sneakers may be a benefit available to you. To determine eligibility, you may visit the website: www.silversneakers.com or contact program at 365-038-6791 Mon-Fri between 8AM-8PM.   . Maintain a healthy weight. The body mass index (BMI) is a screening tool to identify possible weight problems. It provides an estimate of body fat based on height and weight. Your health care provider can find your BMI and can help you achieve or maintain a healthy weight.   For adults 20 years and older: ? A BMI below 18.5 is considered underweight. ? A BMI of 18.5 to 24.9 is normal. ? A BMI of 25 to 29.9 is considered overweight. ? A BMI of 30 and above is considered obese.   . Maintain normal blood lipids and cholesterol levels by exercising and minimizing your intake of saturated fat. Eat a balanced diet with plenty of fruit and vegetables. Blood tests for lipids and cholesterol should begin at age 68 and be repeated every 5  years. If your lipid or cholesterol levels are high, you are over 50, or you are at high risk for heart disease, you may need your cholesterol levels checked more frequently. Ongoing high lipid and cholesterol levels should be treated with medicines if diet and exercise are not working.  . If you smoke, find out from your health care provider how to quit. If you do not use tobacco, please do not start.  . If you choose to drink alcohol, please do not consume more than 2 drinks per day. One drink is considered to be 12 ounces (355 mL) of beer, 5 ounces  (148 mL) of wine, or 1.5 ounces (44 mL) of liquor.  . If you are 82-71 years old, ask your health care provider if you should take aspirin to prevent strokes.  . Use sunscreen. Apply sunscreen liberally and repeatedly throughout the day. You should seek shade when your shadow is shorter than you. Protect yourself by wearing long sleeves, pants, a wide-brimmed hat, and sunglasses year round, whenever you are outdoors.  . Once a month, do a whole body skin exam, using a mirror to look at the skin on your back. Tell your health care provider of new moles, moles that have irregular borders, moles that are larger than a pencil eraser, or moles that have changed in shape or color.     Fall Prevention in the Home  Falls can cause injuries. They can happen to people of all ages. There are many things you can do to make your home safe and to help prevent falls.  WHAT CAN I DO ON THE OUTSIDE OF MY HOME?  Regularly fix the edges of walkways and driveways and fix any cracks.  Remove anything that might make you trip as you walk through a door, such as a raised step or threshold.  Trim any bushes or trees on the path to your home.  Use bright outdoor lighting.  Clear any walking paths of anything that might make someone trip, such as rocks or tools.  Regularly check to see if handrails are loose or broken. Make sure that both sides of any steps have handrails.  Any raised decks and porches should have guardrails on the edges.  Have any leaves, snow, or ice cleared regularly.  Use sand or salt on walking paths during winter.  Clean up any spills in your garage right away. This includes oil or grease spills. WHAT CAN I DO IN THE BATHROOM?   Use night lights.  Install grab bars by the toilet and in the tub and shower. Do not use towel bars as grab bars.  Use non-skid mats or decals in the tub or shower.  If you need to sit down in the shower, use a plastic, non-slip stool.  Keep the floor  dry. Clean up any water that spills on the floor as soon as it happens.  Remove soap buildup in the tub or shower regularly.  Attach bath mats securely with double-sided non-slip rug tape.  Do not have throw rugs and other things on the floor that can make you trip. WHAT CAN I DO IN THE BEDROOM?  Use night lights.  Make sure that you have a light by your bed that is easy to reach.  Do not use any sheets or blankets that are too big for your bed. They should not hang down onto the floor.  Have a firm chair that has side arms. You can use this for support while  you get dressed.  Do not have throw rugs and other things on the floor that can make you trip. WHAT CAN I DO IN THE KITCHEN?  Clean up any spills right away.  Avoid walking on wet floors.  Keep items that you use a lot in easy-to-reach places.  If you need to reach something above you, use a strong step stool that has a grab bar.  Keep electrical cords out of the way.  Do not use floor polish or wax that makes floors slippery. If you must use wax, use non-skid floor wax.  Do not have throw rugs and other things on the floor that can make you trip. WHAT CAN I DO WITH MY STAIRS?  Do not leave any items on the stairs.  Make sure that there are handrails on both sides of the stairs and use them. Fix handrails that are broken or loose. Make sure that handrails are as long as the stairways.  Check any carpeting to make sure that it is firmly attached to the stairs. Fix any carpet that is loose or worn.  Avoid having throw rugs at the top or bottom of the stairs. If you do have throw rugs, attach them to the floor with carpet tape.  Make sure that you have a light switch at the top of the stairs and the bottom of the stairs. If you do not have them, ask someone to add them for you. WHAT ELSE CAN I DO TO HELP PREVENT FALLS?  Wear shoes that:  Do not have high heels.  Have rubber bottoms.  Are comfortable and fit you  well.  Are closed at the toe. Do not wear sandals.  If you use a stepladder:  Make sure that it is fully opened. Do not climb a closed stepladder.  Make sure that both sides of the stepladder are locked into place.  Ask someone to hold it for you, if possible.  Clearly mark and make sure that you can see:  Any grab bars or handrails.  First and last steps.  Where the edge of each step is.  Use tools that help you move around (mobility aids) if they are needed. These include:  Canes.  Walkers.  Scooters.  Crutches.  Turn on the lights when you go into a dark area. Replace any light bulbs as soon as they burn out.  Set up your furniture so you have a clear path. Avoid moving your furniture around.  If any of your floors are uneven, fix them.  If there are any pets around you, be aware of where they are.  Review your medicines with your doctor. Some medicines can make you feel dizzy. This can increase your chance of falling. Ask your doctor what other things that you can do to help prevent falls.   This information is not intended to replace advice given to you by your health care provider. Make sure you discuss any questions you have with your health care provider.   Document Released: 09/18/2009 Document Revised: 04/08/2015 Document Reviewed: 12/27/2014 Elsevier Interactive Patient Education [employer].
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
## 2016-03-23 NOTE — Progress Notes (Signed)
I reviewed health advisor's note, was available for consultation, and agree with documentation and plan.  

## 2016-03-25 ENCOUNTER — Other Ambulatory Visit (INDEPENDENT_AMBULATORY_CARE_PROVIDER_SITE_OTHER): Payer: Medicare Other

## 2016-03-25 DIAGNOSIS — E785 Hyperlipidemia, unspecified: Secondary | ICD-10-CM | POA: Diagnosis not present

## 2016-03-25 DIAGNOSIS — E559 Vitamin D deficiency, unspecified: Secondary | ICD-10-CM | POA: Diagnosis not present

## 2016-03-25 LAB — LIPID PANEL
CHOL/HDL RATIO: 3
Cholesterol: 210 mg/dL — ABNORMAL HIGH (ref 0–200)
HDL: 66.4 mg/dL (ref >39.00)
LDL CALC: 126 mg/dL — AB (ref 0–99)
NonHDL: 144.08
TRIGLYCERIDES: 88 mg/dL (ref 0.0–149.0)
VLDL: 17.6 mg/dL (ref 0.0–40.0)

## 2016-03-25 LAB — BASIC METABOLIC PANEL
BUN: 17 mg/dL (ref 6–23)
CHLORIDE: 103 meq/L (ref 96–112)
CO2: 31 mEq/L (ref 19–32)
CREATININE: 0.72 mg/dL (ref 0.40–1.20)
Calcium: 10 mg/dL (ref 8.4–10.5)
GFR: 84.55 mL/min (ref >60.00)
GLUCOSE: 88 mg/dL (ref 70–99)
POTASSIUM: 3.6 meq/L (ref 3.5–5.1)
Sodium: 141 mEq/L (ref 135–145)

## 2016-03-25 LAB — TSH: TSH: 4.88 u[IU]/mL — AB (ref 0.35–4.50)

## 2016-03-25 LAB — VITAMIN D 25 HYDROXY (VIT D DEFICIENCY, FRACTURES): VITD: 22.94 ng/mL — ABNORMAL LOW (ref 30.00–100.00)

## 2016-04-01 ENCOUNTER — Ambulatory Visit (INDEPENDENT_AMBULATORY_CARE_PROVIDER_SITE_OTHER): Payer: Medicare Other | Admitting: Family Medicine

## 2016-04-01 ENCOUNTER — Encounter: Payer: Self-pay | Admitting: Family Medicine

## 2016-04-01 VITALS — BP 112/66 | HR 76 | Temp 98.1°F | Wt 105.8 lb

## 2016-04-01 DIAGNOSIS — S46002D Unspecified injury of muscle(s) and tendon(s) of the rotator cuff of left shoulder, subsequent encounter: Secondary | ICD-10-CM

## 2016-04-01 DIAGNOSIS — E785 Hyperlipidemia, unspecified: Secondary | ICD-10-CM | POA: Diagnosis not present

## 2016-04-01 DIAGNOSIS — E559 Vitamin D deficiency, unspecified: Secondary | ICD-10-CM | POA: Diagnosis not present

## 2016-04-01 DIAGNOSIS — R799 Abnormal finding of blood chemistry, unspecified: Secondary | ICD-10-CM | POA: Diagnosis not present

## 2016-04-01 DIAGNOSIS — M810 Age-related osteoporosis without current pathological fracture: Secondary | ICD-10-CM | POA: Diagnosis not present

## 2016-04-01 DIAGNOSIS — Z8052 Family history of malignant neoplasm of bladder: Secondary | ICD-10-CM

## 2016-04-01 DIAGNOSIS — R7989 Other specified abnormal findings of blood chemistry: Secondary | ICD-10-CM | POA: Diagnosis not present

## 2016-04-01 LAB — POC URINALSYSI DIPSTICK (AUTOMATED)
Bilirubin, UA: NEGATIVE
GLUCOSE UA: NEGATIVE
KETONES UA: NEGATIVE
Leukocytes, UA: NEGATIVE
Nitrite, UA: NEGATIVE
Protein, UA: NEGATIVE
SPEC GRAV UA: 1.025
UROBILINOGEN UA: 0.2
pH, UA: 6

## 2016-04-01 MED ORDER — VITAMIN D3 25 MCG (1000 UT) PO CAPS: 1.0000 | ORAL_CAPSULE | Freq: Every day | ORAL | Status: DC

## 2016-04-01 MED ORDER — CALCIUM-VITAMIN D 600-400 MG-UNIT PO TABS: 1.0000 | ORAL_TABLET | Freq: Every day | ORAL | Status: DC

## 2016-04-01 NOTE — Assessment & Plan Note (Signed)
Reviewed current regimen of fosamax 70mg  weekly, calcium, vit d, weight bearing exercise. Continue. Suggested update DEXA late summer (to allow more time on 70mg  dose).

## 2016-04-01 NOTE — Assessment & Plan Note (Signed)
Check TFTs today

## 2016-04-01 NOTE — Addendum Note (Signed)
Addended by: Royann Shivers A on: 04/01/2016 04:57 PM   Modules accepted: Orders

## 2016-04-01 NOTE — Assessment & Plan Note (Signed)
Minimal off meds. 

## 2016-04-01 NOTE — Progress Notes (Signed)
BP 112/66 mmHg  Pulse 76  Temp(Src) 98.1 F (36.7 C) (Oral)  Wt 105 lb 12 oz (47.968 kg)   CC: f/u visit  Subjective:    Patient ID: Kimberly Davies, female    DOB: 15-Nov-1941, 75 y.o.   MRN: [id number]  HPI: Kimberly Davies is a 75 y.o. female presenting on 04/01/2016 for Follow-up   Seen by Katha Cabal 4/17 for AMW. Discussed increased weight bearing exercise goal.  TSH elevated on labs this week - pt denies hypothyroid symptoms like weight gain, cold intolerance, constipation, skin/hair changes. No fmhx thyroid disease  Osteoporosis Date: 2008 took actonel for 5 yrs. Takes calcium/vit D 600/400mg  daily. Twice daily caused constipation. Does drink milk, yogurt, cheese daily.  DEXA Date: 02/2014 T score -3.1 at spine and -2.8 at hip Dental work end of 2015.  L shoulder pain - saw orthopedist. Suggested home exercise vs PT. Thought RTC injury.   Relevant past medical, surgical, family and social history reviewed and updated as indicated. Interim medical history since our last visit reviewed. Allergies and medications reviewed and updated. Current Outpatient Prescriptions on File Prior to Visit  Medication Sig  . alendronate (FOSAMAX) 70 MG tablet Take 1 tablet (70 mg total) by mouth every 7 (seven) days. Take with a full glass of water on an empty stomach.   No current facility-administered medications on file prior to visit.    Review of Systems Per HPI unless specifically indicated in ROS section     Objective:    BP 112/66 mmHg  Pulse 76  Temp(Src) 98.1 F (36.7 C) (Oral)  Wt 105 lb 12 oz (47.968 kg)  Wt Readings from Last 3 Encounters:  04/01/16 105 lb 12 oz (47.968 kg)  03/22/16 106 lb 4 oz (48.195 kg)  01/27/16 101 lb 8 oz (46.04 kg)    Physical Exam  Constitutional: She appears well-developed and well-nourished. No distress.  HENT:  Head: Normocephalic and atraumatic.  Mouth/Throat: Oropharynx is clear and moist. No oropharyngeal exudate.  Eyes: Conjunctivae and  EOM are normal. Pupils are equal, round, and reactive to light.  Neck: Normal range of motion. Neck supple. No thyromegaly present.  Cardiovascular: Normal rate, regular rhythm, normal heart sounds and intact distal pulses.   No murmur heard. Pulmonary/Chest: Effort normal and breath sounds normal. No respiratory distress. She has no wheezes. She has no rales.  Abdominal: Soft. Bowel sounds are normal. She exhibits no distension. There is no tenderness. There is no rebound and no guarding.  Lymphadenopathy:    She has no cervical adenopathy.  Skin: Skin is warm and dry. No rash noted.  Psychiatric: She has a normal mood and affect.  Nursing note and vitals reviewed.  Results for orders placed or performed in visit on 03/25/16  TSH  Result Value Ref Range   TSH 4.88 (H) 0.35 - 4.50 uIU/mL  Vitamin D, 25-hydroxy  Result Value Ref Range   VITD 22.94 (L) 30.00 - 100.00 ng/mL  Lipid Panel  Result Value Ref Range   Cholesterol 210 (H) 0 - 200 mg/dL   Triglycerides 88.0 0.0 - 149.0 mg/dL   HDL 66.40 >39.00 mg/dL   VLDL 17.6 0.0 - 40.0 mg/dL   LDL Cholesterol 126 (H) 0 - 99 mg/dL   Total CHOL/HDL Ratio 3    NonHDL [id number]   Basic Metabolic Panel  Result Value Ref Range   Sodium 141 135 - 145 mEq/L   Potassium 3.6 3.5 - 5.1 mEq/L   Chloride 103  96 - 112 mEq/L   CO2 31 19 - 32 mEq/L   Glucose, Bld 88 70 - 99 mg/dL   BUN 17 6 - 23 mg/dL   Creatinine, Ser 0.72 0.40 - 1.20 mg/dL   Calcium 10.0 8.4 - 10.5 mg/dL   GFR 84.55 >60.00 mL/min      Assessment & Plan:   Problem List Items Addressed This Visit    HLD (hyperlipidemia)    Minimal off meds.      Osteoporosis - Primary    Reviewed current regimen of fosamax 70mg  weekly, calcium, vit d, weight bearing exercise. Continue. Suggested update DEXA late summer (to allow more time on 70mg  dose).       Relevant Medications   Calcium Carb-Cholecalciferol (CALCIUM-VITAMIN D) 600-400 MG-UNIT TABS   Cholecalciferol (VITAMIN D3) 1000  units CAPS   Vitamin D deficiency    rec restart 1000 IU OTC daily.       Injury of left rotator cuff    Slowly improving with home exercises by [facility].       Abnormal TSH    Check TFTs today.      Relevant Orders   TSH   T3   T4, free    Other Visit Diagnoses    Family history of bladder cancer            Follow up plan: Return in about 1 year (around 04/01/2017), or as needed, for follow up visit.  Kimberly Bush, MD

## 2016-04-01 NOTE — Assessment & Plan Note (Signed)
Slowly improving with home exercises by [facility].

## 2016-04-01 NOTE — Progress Notes (Signed)
Pre visit review using our clinic review tool, if applicable. No additional management support is needed unless otherwise documented below in the visit note. 

## 2016-04-01 NOTE — Patient Instructions (Addendum)
Schedule mammogram and bone density scan in late summer months. Recheck thyroid function today. Start vitamin D 1000 units daily. Return as needed or in 1 year for next follow up visit Check urinalysis today

## 2016-04-01 NOTE — Assessment & Plan Note (Signed)
rec restart 1000 IU OTC daily.

## 2016-04-02 LAB — TSH: TSH: 2.36 u[IU]/mL (ref 0.35–4.50)

## 2016-04-02 LAB — T3: T3, Total: 103 ng/dL (ref 76–181)

## 2016-04-02 LAB — T4, FREE: Free T4: 0.91 ng/dL (ref 0.60–1.60)

## 2016-04-05 ENCOUNTER — Encounter: Payer: Self-pay | Admitting: *Deleted

## 2016-04-05 ENCOUNTER — Other Ambulatory Visit: Payer: Self-pay

## 2016-04-05 ENCOUNTER — Other Ambulatory Visit (INDEPENDENT_AMBULATORY_CARE_PROVIDER_SITE_OTHER): Payer: Medicare Other

## 2016-04-05 DIAGNOSIS — Z8052 Family history of malignant neoplasm of bladder: Secondary | ICD-10-CM | POA: Diagnosis not present

## 2016-04-05 LAB — URINALYSIS, MICROSCOPIC ONLY: RBC / HPF: NONE SEEN (ref 0–?)

## 2016-05-24 ENCOUNTER — Telehealth: Payer: Self-pay

## 2016-05-24 NOTE — Telephone Encounter (Signed)
PLEASE NOTE: All timestamps contained within this report are represented as [place] Standard Time. CONFIDENTIALTY NOTICE: This fax transmission is intended only for the addressee. It contains information that is legally privileged, confidential or otherwise protected from use or disclosure. If you are not the intended recipient, you are strictly prohibited from reviewing, disclosing, copying using or disseminating any of this information or taking any action in reliance on or regarding this information. If you have received this fax in error, please notify us immediately by telephone so that we can arrange for its return to [place]. Phone: [phone], Toll-Free: [phone], Fax: [phone] Page: 1 of 2 Call Id: [id number] [place] Patient Name: Kimberly Davies Gender: Female DOB: 09-27-1941 Age: 75 Y 57 M 24 D Return Phone Number: [phone] (Primary), [phone] (Secondary) Address: City/State/Zip: [place] Client [place] [facility] - Client Client Site [place] Physician Ria Bush - MD Contact Type Call Who Is Calling Patient / Member / Family / Caregiver Call Type Triage / Clinical Caller Name Keshauna Vicencio Relationship To Patient Spouse Return Phone Number [phone] (Primary) Chief Complaint Cuts and Lacerations Reason for Call Symptomatic / Request for [place] states wife cut her leg while shaving and has a compression bandage on it. PreDisposition Go to Urgent Care/Walk-In Clinic Translation No Nurse Assessment Nurse: Denyse Amass, RN, Benjamine Mola Date/Time (Eastern Time): 05/21/2016 8:32:47 PM Confirm and document reason for call. If symptomatic, describe symptoms. You must click the next button to save text entered. ---Patient states she cut her left foot while shaving and hit the vein and the  blood was squirting out. She put a compression bandage on it and it has not bled through the bandage. Has the patient traveled out of the country within the last 30 days? ---Not Applicable Does the patient have any new or worsening symptoms? ---Yes Will a triage be completed? ---Yes Related visit to physician within the last 2 weeks? ---No Does the PT have any chronic conditions? (i.e. diabetes, asthma, etc.) ---No Is this a behavioral health or substance abuse call? ---No Guidelines Guideline Title Affirmed Question Affirmed Notes Nurse Date/Time (Eastern Time) Cuts and Lacerations Minor cut or scratch (all triage questions negative) [place], RN, Benjamine Mola 05/21/2016 8:36:16 PM Disp. Time [name] Time) Disposition Final User 05/21/2016 8:38:08 PM Home Care Yes [place], RN, Lorin Glass Understands: Yes PLEASE NOTE: All timestamps contained within this report are represented as [place] Standard Time. CONFIDENTIALTY NOTICE: This fax transmission is intended only for the addressee. It contains information that is legally privileged, confidential or otherwise protected from use or disclosure. If you are not the intended recipient, you are strictly prohibited from reviewing, disclosing, copying using or disseminating any of this information or taking any action in reliance on or regarding this information. If you have received this fax in error, please notify us immediately by telephone so that we can arrange for its return to [place]. Phone: [phone], Toll-Free: [phone], Fax: [phone] Page: 2 of 2 Call Id: [id number] Disagree/Comply: Comply Care Advice Given Per Guideline HOME CARE: You should be able to treat this at home. REASSURANCE: It sounds like a small cut or scrape that we can treat at home. BLEEDING: Apply direct pressure for 10 minutes with a sterile gauze to stop any bleeding. CLEANSING: * Wash the wound with soap and water for 5 minutes. * For any dirt, scrub  gently with a  wash cloth. * For any bleeding, apply direct pressure with a sterile gauze or clean cloth for 10 minutes. ANTIBIOTIC OINTMENT: * Apply an ANTIBIOTIC OINTMENT (e.g., OTC Bacitracin), covered by a Band-Aid or dressing. Change daily or if it becomes wet. * Option: A TEFLA dressing won't stick to the wound when it is removed. - * Option: Another option is to use a LIQUID SKIN BANDAGE that only needs to be applied once. Don't use antibiotic ointment if you use a liquid skin bandage. CALL BACK IF: * Looks infected (pus, redness, increasing tenderness) * Doesn't heal within 14 days * Bleeding does not stop after using direct pressure * You become worse.

## 2016-09-05 DIAGNOSIS — I82409 Acute embolism and thrombosis of unspecified deep veins of unspecified lower extremity: Secondary | ICD-10-CM

## 2016-09-05 HISTORY — DX: Acute embolism and thrombosis of unspecified deep veins of unspecified lower extremity: I82.409

## 2016-09-27 ENCOUNTER — Telehealth: Payer: Self-pay | Admitting: Family Medicine

## 2016-09-27 ENCOUNTER — Encounter: Payer: Self-pay | Admitting: Family Medicine

## 2016-09-27 ENCOUNTER — Ambulatory Visit (INDEPENDENT_AMBULATORY_CARE_PROVIDER_SITE_OTHER): Payer: Medicare Other | Admitting: Family Medicine

## 2016-09-27 VITALS — BP 108/66 | HR 81 | Temp 98.6°F | Ht 62.25 in | Wt 104.5 lb

## 2016-09-27 DIAGNOSIS — M79662 Pain in left lower leg: Secondary | ICD-10-CM | POA: Insufficient documentation

## 2016-09-27 DIAGNOSIS — I8390 Asymptomatic varicose veins of unspecified lower extremity: Secondary | ICD-10-CM

## 2016-09-27 NOTE — Telephone Encounter (Signed)
Pt has appt with Dr Glori Bickers 09/27/16 at 3:30.

## 2016-09-27 NOTE — Telephone Encounter (Signed)
I will see her then  

## 2016-09-27 NOTE — Assessment & Plan Note (Signed)
Pt is now having pain over L calf (overlying a compressible varicosity) Suspect phlebitis Enc leg elevation and also continued support hose
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
## 2016-09-27 NOTE — Progress Notes (Signed)
Pre visit review using our clinic review tool, if applicable. No additional management support is needed unless otherwise documented below in the visit note. 

## 2016-09-27 NOTE — Patient Instructions (Addendum)
I think you have phlebitis (an inflamed varicose vein) in your leg  Elevate it whenever you can  Wear your support hose when not sleep  No doubt-sitting and standing made it worse Use a warm compress Take 81 mg aspirin daily with food   Stop up front for a referral for a doppler (ultrasound) of your leg   If pain suddenly worsens or you develop chest pain or shortness of breath-go to the ER

## 2016-09-27 NOTE — Assessment & Plan Note (Signed)
This overlies an area of tortuous varicosity that is compressible currently (warm but not erythematous and no ulceration) Suspect superficial phlebitis  Doubt DVT but will get venous doppler to better evaluate Recommend elevation/ support hose/warm compress  See AVS
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
## 2016-09-28 ENCOUNTER — Telehealth: Payer: Self-pay

## 2016-09-28 ENCOUNTER — Telehealth: Payer: Self-pay | Admitting: Family Medicine

## 2016-09-28 ENCOUNTER — Ambulatory Visit: Payer: Medicare Other

## 2016-09-28 DIAGNOSIS — M79662 Pain in left lower leg: Secondary | ICD-10-CM

## 2016-09-28 DIAGNOSIS — I8390 Asymptomatic varicose veins of unspecified lower extremity: Secondary | ICD-10-CM | POA: Diagnosis not present

## 2016-09-28 MED ORDER — RIVAROXABAN 15 MG PO TABS: ORAL_TABLET | ORAL | 1 refills | Status: DC

## 2016-09-28 NOTE — Telephone Encounter (Signed)
Noted. Thanks.

## 2016-09-28 NOTE — Telephone Encounter (Signed)
Per verbal from Pilar Jarvis, echo tech, positive left DVT lesser saphenous vein and left gastrocnemius.  S/w Dr. Loura Pardon w/preliminary report information. She verbalized understanding and will call patient at home number.

## 2016-09-28 NOTE — Telephone Encounter (Signed)
Call report from the vascular lab-pt has a left lesser saphenous vein and gastrocnemius vein DVT - this is still a preliminary report  Pending report in epic Spoke to pt via cell phone to give results  Plan to send px for xarelto to her pharmacy  Continue supp hose/elevation  She states no change in symptoms today   [facility] she will call us back  inst to make appt with PCP next week for a re check   Will most likely tx for 3 mo   No hx of bleeding renal dz / prev blood thinner or clotting disorder   Disc poss side eff ie: bleeding  Will check in with her when she starts the medication   inst to go to ED if an sob or chest pain and alert [place]

## 2016-09-29 NOTE — Telephone Encounter (Signed)
Left voicemail requesting pt to call the office back 

## 2016-09-29 NOTE — Telephone Encounter (Signed)
Pt is doing okay she was able to get 20 tabs of med pt said the pharmacy is having a hard time getting the xarelto so she is going to call around to a few pharmacies to see if they have it in stock, if so she will let Dr. Darnell Level know at her f/u so he can send Rx to new pharmacy, I scheduled her f/u with Dr. Darnell Level on 10/04/16 per pt request   FYI to Dr. Darnell Level

## 2016-09-29 NOTE — Telephone Encounter (Signed)
Please check in with her and make sure she got the medication and that she schedules a f/u with Dr Darnell Level next week  thanks

## 2016-10-04 ENCOUNTER — Ambulatory Visit (INDEPENDENT_AMBULATORY_CARE_PROVIDER_SITE_OTHER): Payer: Medicare Other | Admitting: Family Medicine

## 2016-10-04 ENCOUNTER — Encounter: Payer: Self-pay | Admitting: Family Medicine

## 2016-10-04 VITALS — BP 114/86 | HR 100 | Temp 97.9°F | Wt 104.5 lb

## 2016-10-04 DIAGNOSIS — I824Z2 Acute embolism and thrombosis of unspecified deep veins of left distal lower extremity: Secondary | ICD-10-CM | POA: Diagnosis not present

## 2016-10-04 DIAGNOSIS — M810 Age-related osteoporosis without current pathological fracture: Secondary | ICD-10-CM

## 2016-10-04 DIAGNOSIS — Z1231 Encounter for screening mammogram for malignant neoplasm of breast: Secondary | ICD-10-CM | POA: Diagnosis not present

## 2016-10-04 DIAGNOSIS — I82409 Acute embolism and thrombosis of unspecified deep veins of unspecified lower extremity: Secondary | ICD-10-CM | POA: Insufficient documentation

## 2016-10-04 DIAGNOSIS — Z23 Encounter for immunization: Secondary | ICD-10-CM

## 2016-10-04 DIAGNOSIS — Z1239 Encounter for other screening for malignant neoplasm of breast: Secondary | ICD-10-CM

## 2016-10-04 HISTORY — DX: Acute embolism and thrombosis of unspecified deep veins of left distal lower extremity: I82.4Z2

## 2016-10-04 MED ORDER — RIVAROXABAN 15 MG PO TABS: 15.0000 mg | ORAL_TABLET | Freq: Two times a day (BID) | ORAL | 0 refills | Status: DC

## 2016-10-04 MED ORDER — RIVAROXABAN 20 MG PO TABS: 20.0000 mg | ORAL_TABLET | Freq: Every day | ORAL | 3 refills | Status: DC

## 2016-10-04 NOTE — Patient Instructions (Addendum)
Flu shot today xarelto dose: 15mg  twice daily to compete 3 weeks then start 20mg  dose once daily. 20mg  dose printed out for you.  Return in 3 months for follow up visit.  Continue compression stockings as tolerated  We will order mammogram and bone density scan as we're due.

## 2016-10-04 NOTE — Assessment & Plan Note (Signed)
First provoked symptomatic distal DVT. Will likely do well with just 3 months of treatment.  Tolerating xarelto 15mg  bid dosing well to complete 3 wk course then start 20mg  daily. RTC 3 mo f/u visit. Reviewed bleeding risk and need to avoid any possible falls.
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
## 2016-10-04 NOTE — Assessment & Plan Note (Signed)
Update DEXA on higher fosamax dose over last 1.5 yrs.

## 2016-10-04 NOTE — Progress Notes (Signed)
Pre visit review using our clinic review tool, if applicable. No additional management support is needed unless otherwise documented below in the visit note. 

## 2016-10-08 ENCOUNTER — Other Ambulatory Visit: Payer: Self-pay

## 2016-10-08 NOTE — Telephone Encounter (Signed)
Pt was seen 10/04/16 and pt has blood clot in rt leg/ pt is presently taking Xarelto and wearing compression stockings. Pt wants to know how much exercising she can do.  Can she be up and about in the house and yard without restriction. Pt request cb.

## 2016-10-08 NOTE — Telephone Encounter (Signed)
Patient advised and asks if it is unusual for it to still continue to hurt after this amount of time?  Patient says it is not any worse but is still hurting.  She is concerned because this issue runs in their family.

## 2016-10-08 NOTE — Telephone Encounter (Signed)
Usually we rec activity as tolerated in the meantime.  I wouldn't overdo it, would try to get moving gradually as tolerated.  May still need some time with leg elevation for swelling.  Thanks.

## 2016-10-11 NOTE — Telephone Encounter (Signed)
There will be some pain from inflammation of veins. Ensure no fever or streaking redness. How much is it hurting? If desired, we could try to prescribe topical NSAID for symptomatic relief. If willing, plz send in voltaren gel 1% 1 application topically 3 times daily PRN with 1 tube and RF:0

## 2016-10-19 ENCOUNTER — Telehealth: Payer: Self-pay

## 2016-10-19 NOTE — Telephone Encounter (Signed)
Agree likely from favoring left leg. I'm not concerned for R blood clot as she's now on blood thinner. Recommend rest, may use heating pad or tylenol for R leg pain, and come in for eval if persistent discomfort.

## 2016-10-19 NOTE — Telephone Encounter (Signed)
Pt said she is to start the Xarelto 20 mg tonight; for a few days pt has noticed pain in rt leg. Pt thinks the pain is due to pt bearing more weight on rt leg due to concern for the left leg.pt having pain in back of rt leg above the knee and at the back of lower leg near the ankle. No lump, swelling, redness or warmth noted in rt leg. Pt just concerned since having problem with lt leg. Pt request cb.[facility].

## 2016-10-19 NOTE — Telephone Encounter (Signed)
Patient notified and verbalized understanding. 

## 2016-10-21 ENCOUNTER — Encounter (HOSPITAL_COMMUNITY): Payer: Self-pay | Admitting: *Deleted

## 2016-10-21 ENCOUNTER — Observation Stay (HOSPITAL_COMMUNITY)
Admission: EM | Admit: 2016-10-21 | Discharge: 2016-10-22 | Disposition: A | Discharge status: Home / Self Care | Payer: Medicare Other | Attending: Internal Medicine | Admitting: Internal Medicine

## 2016-10-21 DIAGNOSIS — K625 Hemorrhage of anus and rectum: Principal | ICD-10-CM | POA: Diagnosis present

## 2016-10-21 DIAGNOSIS — K648 Other hemorrhoids: Secondary | ICD-10-CM | POA: Insufficient documentation

## 2016-10-21 DIAGNOSIS — M810 Age-related osteoporosis without current pathological fracture: Secondary | ICD-10-CM | POA: Diagnosis present

## 2016-10-21 DIAGNOSIS — I82409 Acute embolism and thrombosis of unspecified deep veins of unspecified lower extremity: Secondary | ICD-10-CM | POA: Diagnosis present

## 2016-10-21 DIAGNOSIS — Z880 Allergy status to penicillin: Secondary | ICD-10-CM | POA: Diagnosis not present

## 2016-10-21 DIAGNOSIS — Z7901 Long term (current) use of anticoagulants: Secondary | ICD-10-CM | POA: Insufficient documentation

## 2016-10-21 DIAGNOSIS — I10 Essential (primary) hypertension: Secondary | ICD-10-CM | POA: Insufficient documentation

## 2016-10-21 DIAGNOSIS — E785 Hyperlipidemia, unspecified: Secondary | ICD-10-CM | POA: Insufficient documentation

## 2016-10-21 DIAGNOSIS — Z885 Allergy status to narcotic agent status: Secondary | ICD-10-CM | POA: Diagnosis not present

## 2016-10-21 DIAGNOSIS — K644 Residual hemorrhoidal skin tags: Secondary | ICD-10-CM | POA: Insufficient documentation

## 2016-10-21 DIAGNOSIS — Z86718 Personal history of other venous thrombosis and embolism: Secondary | ICD-10-CM | POA: Insufficient documentation

## 2016-10-21 DIAGNOSIS — I824Z2 Acute embolism and thrombosis of unspecified deep veins of left distal lower extremity: Secondary | ICD-10-CM | POA: Diagnosis present

## 2016-10-21 DIAGNOSIS — K5730 Diverticulosis of large intestine without perforation or abscess without bleeding: Secondary | ICD-10-CM | POA: Diagnosis present

## 2016-10-21 DIAGNOSIS — E876 Hypokalemia: Secondary | ICD-10-CM | POA: Diagnosis not present

## 2016-10-21 HISTORY — DX: Acute embolism and thrombosis of unspecified deep veins of unspecified lower extremity: I82.409

## 2016-10-21 LAB — COMPREHENSIVE METABOLIC PANEL
ALBUMIN: 4 g/dL (ref 3.5–5.0)
ALK PHOS: 63 U/L (ref 38–126)
ALT: 12 U/L — ABNORMAL LOW (ref 14–54)
ANION GAP: 8 (ref 5–15)
AST: 20 U/L (ref 15–41)
BUN: 15 mg/dL (ref 6–20)
CALCIUM: 10.1 mg/dL (ref 8.9–10.3)
CHLORIDE: 102 mmol/L (ref 101–111)
CO2: 28 mmol/L (ref 22–32)
Creatinine, Ser: 0.64 mg/dL (ref 0.44–1.00)
GFR calc Af Amer: >60 mL/min (ref >60)
GFR calc non Af Amer: >60 mL/min (ref >60)
GLUCOSE: 106 mg/dL — AB (ref 65–99)
POTASSIUM: 3.3 mmol/L — AB (ref 3.5–5.1)
SODIUM: 138 mmol/L (ref 135–145)
Total Bilirubin: 0.5 mg/dL (ref 0.3–1.2)
Total Protein: 7.9 g/dL (ref 6.5–8.1)

## 2016-10-21 LAB — CBC
HEMATOCRIT: 38.1 % (ref 36.0–46.0)
HEMOGLOBIN: 12 g/dL (ref 12.0–15.0)
MCH: 28.4 pg (ref 26.0–34.0)
MCHC: 31.5 g/dL (ref 30.0–36.0)
MCV: 90.1 fL (ref 78.0–100.0)
Platelets: 394 10*3/uL (ref 150–400)
RBC: 4.23 MIL/uL (ref 3.87–5.11)
RDW: 13.3 % (ref 11.5–15.5)
WBC: 6 10*3/uL (ref 4.0–10.5)

## 2016-10-21 LAB — TYPE AND SCREEN
ABO/RH(D): A POS
Antibody Screen: NEGATIVE

## 2016-10-21 LAB — POC OCCULT BLOOD, ED: FECAL OCCULT BLD: POSITIVE — AB

## 2016-10-21 LAB — ABO/RH: ABO/RH(D): A POS

## 2016-10-21 MED ORDER — SODIUM CHLORIDE 0.9 % IV SOLN
Freq: Once | INTRAVENOUS | Status: AC
  Administered 2016-10-22: via INTRAVENOUS

## 2016-10-21 NOTE — ED Triage Notes (Signed)
Pt was diagnosed with a blood clot in the l leg and started on xarleto 30mg  4 weeks ago. On Tuesday, Xarleto dosage was decreased to 20mg . Tonight pt reports having one large bowel movement of bright red blood. Denies abdominal pain, c/o weakness
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
## 2016-10-22 ENCOUNTER — Encounter (HOSPITAL_COMMUNITY): Payer: Self-pay | Admitting: *Deleted

## 2016-10-22 ENCOUNTER — Telehealth: Payer: Self-pay

## 2016-10-22 ENCOUNTER — Observation Stay (HOSPITAL_BASED_OUTPATIENT_CLINIC_OR_DEPARTMENT_OTHER): Payer: Medicare Other

## 2016-10-22 DIAGNOSIS — K625 Hemorrhage of anus and rectum: Secondary | ICD-10-CM

## 2016-10-22 DIAGNOSIS — I82409 Acute embolism and thrombosis of unspecified deep veins of unspecified lower extremity: Secondary | ICD-10-CM

## 2016-10-22 DIAGNOSIS — K5730 Diverticulosis of large intestine without perforation or abscess without bleeding: Secondary | ICD-10-CM

## 2016-10-22 DIAGNOSIS — I824Z2 Acute embolism and thrombosis of unspecified deep veins of left distal lower extremity: Secondary | ICD-10-CM

## 2016-10-22 DIAGNOSIS — E876 Hypokalemia: Secondary | ICD-10-CM

## 2016-10-22 HISTORY — DX: Hemorrhage of anus and rectum: K62.5

## 2016-10-22 LAB — CBC
HEMATOCRIT: 35.7 % — AB (ref 36.0–46.0)
HEMATOCRIT: 36 % (ref 36.0–46.0)
HEMATOCRIT: 35.7 % — AB (ref 36.0–46.0)
HEMOGLOBIN: 11.3 g/dL — AB (ref 12.0–15.0)
Hemoglobin: 11.3 g/dL — ABNORMAL LOW (ref 12.0–15.0)
Hemoglobin: 11.2 g/dL — ABNORMAL LOW (ref 12.0–15.0)
MCH: 28.5 pg (ref 26.0–34.0)
MCH: 28.2 pg (ref 26.0–34.0)
MCH: 28.1 pg (ref 26.0–34.0)
MCHC: 31.7 g/dL (ref 30.0–36.0)
MCHC: 31.4 g/dL (ref 30.0–36.0)
MCHC: 31.4 g/dL (ref 30.0–36.0)
MCV: 89.9 fL (ref 78.0–100.0)
MCV: 89.8 fL (ref 78.0–100.0)
MCV: 89.7 fL (ref 78.0–100.0)
PLATELETS: 365 10*3/uL (ref 150–400)
PLATELETS: 369 10*3/uL (ref 150–400)
Platelets: 373 10*3/uL (ref 150–400)
RBC: 3.97 MIL/uL (ref 3.87–5.11)
RBC: 4.01 MIL/uL (ref 3.87–5.11)
RBC: 3.98 MIL/uL (ref 3.87–5.11)
RDW: 13.3 % (ref 11.5–15.5)
RDW: 13.5 % (ref 11.5–15.5)
RDW: 13.4 % (ref 11.5–15.5)
WBC: 7.2 10*3/uL (ref 4.0–10.5)
WBC: 6.5 10*3/uL (ref 4.0–10.5)
WBC: 6.8 10*3/uL (ref 4.0–10.5)

## 2016-10-22 LAB — PROTIME-INR
INR: 1.62
Prothrombin Time: 19.4 seconds — ABNORMAL HIGH (ref 11.4–15.2)

## 2016-10-22 LAB — BASIC METABOLIC PANEL
ANION GAP: 7 (ref 5–15)
BUN: 10 mg/dL (ref 6–20)
CHLORIDE: 107 mmol/L (ref 101–111)
CO2: 27 mmol/L (ref 22–32)
Calcium: 9.5 mg/dL (ref 8.9–10.3)
Creatinine, Ser: 0.58 mg/dL (ref 0.44–1.00)
Glucose, Bld: 89 mg/dL (ref 65–99)
POTASSIUM: 4.3 mmol/L (ref 3.5–5.1)
SODIUM: 141 mmol/L (ref 135–145)

## 2016-10-22 LAB — APTT: APTT: 39 s — AB (ref 24–36)

## 2016-10-22 LAB — GLUCOSE, CAPILLARY: GLUCOSE-CAPILLARY: 91 mg/dL (ref 65–99)

## 2016-10-22 MED ORDER — SODIUM CHLORIDE 0.9 % IV SOLN: INTRAVENOUS | Status: DC

## 2016-10-22 MED ORDER — SODIUM CHLORIDE 0.9 % IV SOLN
INTRAVENOUS | Status: DC
  Administered 2016-10-22: 03:00:00 via INTRAVENOUS

## 2016-10-22 MED ORDER — ONDANSETRON HCL 4 MG PO TABS: 4.0000 mg | ORAL_TABLET | Freq: Four times a day (QID) | ORAL | Status: DC | PRN

## 2016-10-22 MED ORDER — ONDANSETRON HCL 4 MG/2ML IJ SOLN: 4.0000 mg | Freq: Four times a day (QID) | INTRAMUSCULAR | Status: DC | PRN

## 2016-10-22 MED ORDER — POTASSIUM CHLORIDE 20 MEQ/15ML (10%) PO SOLN
20.0000 meq | Freq: Once | ORAL | Status: AC
  Administered 2016-10-22: 20 meq via ORAL
  Filled 2016-10-22: qty 15

## 2016-10-22 MED ORDER — ACETAMINOPHEN 650 MG RE SUPP: 650.0000 mg | Freq: Four times a day (QID) | RECTAL | Status: DC | PRN

## 2016-10-22 MED ORDER — PANTOPRAZOLE SODIUM 40 MG IV SOLR
40.0000 mg | Freq: Two times a day (BID) | INTRAVENOUS | Status: DC
  Administered 2016-10-22 (×2): 40 mg via INTRAVENOUS
  Filled 2016-10-22 (×2): qty 40

## 2016-10-22 MED ORDER — ZOLPIDEM TARTRATE 5 MG PO TABS: 5.0000 mg | ORAL_TABLET | Freq: Every evening | ORAL | Status: DC | PRN

## 2016-10-22 MED ORDER — ACETAMINOPHEN 325 MG PO TABS: 650.0000 mg | ORAL_TABLET | Freq: Four times a day (QID) | ORAL | Status: DC | PRN

## 2016-10-22 MED ORDER — ORAL CARE MOUTH RINSE: 15.0000 mL | Freq: Two times a day (BID) | OROMUCOSAL | Status: DC

## 2016-10-22 MED ORDER — SODIUM CHLORIDE 0.9% FLUSH
3.0000 mL | Freq: Two times a day (BID) | INTRAVENOUS | Status: DC
  Administered 2016-10-22 (×2): 3 mL via INTRAVENOUS

## 2016-10-22 NOTE — Care Management Obs Status (Signed)
[facility] NOTIFICATION   Patient Details  Name: JAQUESE KJAR MRN: [id number] Date of Birth: Sep 07, 1941   Medicare Observation Status Notification Given:  Yes    Carles Collet, RN 10/22/2016, 11:23 AM
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
## 2016-10-22 NOTE — Discharge Instructions (Signed)
Hemorrhoids Hemorrhoids are swollen veins in and around the rectum or anus. There are two types of hemorrhoids:  Internal hemorrhoids. These occur in the veins that are just inside the rectum. They may poke through to the outside and become irritated and painful.  External hemorrhoids. These occur in the veins that are outside of the anus and can be felt as a painful swelling or hard lump near the anus.  Most hemorrhoids do not cause serious problems, and they can be managed with home treatments such as diet and lifestyle changes. If home treatments do not help your symptoms, procedures can be done to shrink or remove the hemorrhoids. What are the causes? This condition is caused by increased pressure in the anal area. This pressure may result from various things, including:  Constipation.  Straining to have a bowel movement.  Diarrhea.  Pregnancy.  Obesity.  Sitting for long periods of time.  Heavy lifting or other activity that causes you to strain.  Anal sex.  What are the signs or symptoms? Symptoms of this condition include:  Pain.  Anal itching or irritation.  Rectal bleeding.  Leakage of stool (feces).  Anal swelling.  One or more lumps around the anus.  How is this diagnosed? This condition can often be diagnosed through a visual exam. Other exams or tests may also be done, such as:  Examination of the rectal area with a gloved hand (digital rectal exam).  Examination of the anal canal using a small tube (anoscope).  A blood test, if you have lost a significant amount of blood.  A test to look inside the colon (sigmoidoscopy or colonoscopy).  How is this treated? This condition can usually be treated at home. However, various procedures may be done if dietary changes, lifestyle changes, and other home treatments do not help your symptoms. These procedures can help make the hemorrhoids smaller or remove them completely. Some of these procedures involve  surgery, and others do not. Common procedures include:  Rubber band ligation. Rubber bands are placed at the base of the hemorrhoids to cut off the blood supply to them.  Sclerotherapy. Medicine is injected into the hemorrhoids to shrink them.  Infrared coagulation. A type of light energy is used to get rid of the hemorrhoids.  Hemorrhoidectomy surgery. The hemorrhoids are surgically removed, and the veins that supply them are tied off.  Stapled hemorrhoidopexy surgery. A circular stapling device is used to remove the hemorrhoids and use staples to cut off the blood supply to them.  Follow these instructions at home: Eating and drinking  Eat foods that have a lot of fiber in them, such as whole grains, beans, nuts, fruits, and vegetables. Ask your health care provider about taking products that have added fiber (fiber supplements).  Drink enough fluid to keep your urine clear or pale yellow. Managing pain and swelling  Take warm sitz baths for 20 minutes, 3-4 times a day to ease pain and discomfort.  If directed, apply ice to the affected area. Using ice packs between sitz baths may be helpful. ? Put ice in a plastic bag. ? Place a towel between your skin and the bag. ? Leave the ice on for 20 minutes, 2-3 times a day. General instructions  Take over-the-counter and prescription medicines only as told by your health care provider.  Use medicated creams or suppositories as told.  Exercise regularly.  Go to the bathroom when you have the urge to have a bowel movement. Do not wait.    Avoid straining to have bowel movements.  Keep the anal area dry and clean. Use wet toilet paper or moist towelettes after a bowel movement.  Do not sit on the toilet for long periods of time. This increases blood pooling and pain. Contact a health care provider if:  You have increasing pain and swelling that are not controlled by treatment or medicine.  You have uncontrolled bleeding.  You  have difficulty having a bowel movement, or you are unable to have a bowel movement.  You have pain or inflammation outside the area of the hemorrhoids. This information is not intended to replace advice given to you by your health care provider. Make sure you discuss any questions you have with your health care provider. Document Released: 11/19/2000 Document Revised: 04/21/2016 Document Reviewed: 08/06/2015 Elsevier Interactive Patient Education  2017 Elsevier Inc.  

## 2016-10-22 NOTE — Progress Notes (Signed)
Preliminary results by tech - Venous Duplex Lower Ext. Completed. No evidence of  deep and superficial vein thrombosis on this exam.  Oda Cogan, BS, RDMS, RVT

## 2016-10-22 NOTE — Discharge Summary (Signed)
Physician Discharge Summary  Kimberly Davies [id number] DOB: [birth year] DOA: 10/21/2016  PCP: Ria Bush, MD  Admit date: 10/21/2016 Discharge date: 10/22/2016  Time spent: 45 minutes  Recommendations for Outpatient Follow-up:  Patient will be discharged to home.  Patient will need to follow up with primary care provider within one week of discharge, repeat CBC and discuss blood thinners..  Follow up with gastroenterology, Dr. Ardis Hughs.  Patient should continue medications as prescribed.  Patient should follow a regular diet.   Discharge Diagnoses:  Rectal Bleeding Recent LLE DVT Hypokalemia   Discharge Condition: Stable  Diet recommendation: regular  Filed Weights   10/22/16 0238  Weight: 47.2 kg (104 lb)    History of present illness:  On 10/22/2016 by Dr. Ivor Costa Kimberly Davies is a 75 y.o. female with medical history significant of diverticulosis, hemorrhoids, hypertension, osteoporosis, recent left leg DVT on Xarelto, who presents with rectal bleeding.  Pt states she had bowel movement tonight at about 7:30, which had bright red blood. She states that she has mild dizziness early, which is not new for today. It has resolved now. She has mild dizziness since starting xarelto. Pt was diagnosed with DVT on 09/28/16. She was started with xarleto 30 mg, which was decreased to 20 mg daily on Tuesday. Patient denies chest pain, shortness of breath, nausea, vomiting, abdominal pain, symptoms for UTI or unilateral weakness. No fever or chills. Pt states that she has mild tenderness in right lower leg.  Hospital Course:  Rectal Bleeding -resolved  -Possibly hemorrhoidal  -Last colonoscopy Sept 2014 showed several small diverticulum in the left colon, small internal and external hemorrhoids -Hemoglobin has remained stable, currently 11.3  -Follow up with Gastroenterology in 1-2 weeks   Recent LLE DVT -patient rececntly diagnosed with DVT in Oct 2017, and started on  Xarelto.  Dose reduced last Tuesday (10/19/2016) -Spoke with GI via phone, recommended Coumadin or Eliquis over Xarelto. Spoke with patient regarding this, she would like to speak with her PCP -LE doppler showed no deep or superficial vein thrombosis  Hypokalemia  -Resolved  Procedures: LE doppler  Consultations: Gastroenterology via phone  Discharge Exam: Vitals:   10/22/16 0229 10/22/16 0710  BP: 127/73 126/72  Pulse: 77 96  Resp: 20 20  Temp: 98 F (36.7 C) 97.8 F (36.6 C)   Patient states she no longer is experiencing rectal bleeding.  Noted that she has had 2 bowel movements in the past 24 hours without bleeding. Denies His pain, shortness of breath, abdominal pain, nausea or vomiting, diarrhea or constipation.   General: Well developed, well nourished, NAD, appears stated age  79: NCAT, mucous membranes moist.  Cardiovascular: S1 S2 auscultated, no murmurs, RRR  Respiratory: Clear to auscultation bilaterally with equal chest rise  Abdomen: Soft, nontender, nondistended, + bowel sounds  Extremities: warm dry without cyanosis clubbing or edema  Neuro: AAOx3, nonfocal  Skin: Without rashes exudates or nodules  Psych: Normal affect and demeanor with intact judgement and insight  Discharge Instructions Discharge Instructions    Discharge instructions    Complete by:  As directed    Patient will be discharged to home.  Patient will need to follow up with primary care provider within one week of discharge, repeat CBC and discuss blood thinners..  Follow up with gastroenterology, Dr. Ardis Hughs.  Patient should continue medications as prescribed.  Patient should follow a regular diet.     Current Discharge Medication List    CONTINUE these medications which have  NOT CHANGED   Details  alendronate (FOSAMAX) 70 MG tablet Take 1 tablet (70 mg total) by mouth every 7 (seven) days. Take with a full glass of water on an empty stomach. Qty: 4 tablet, Refills: 11      rivaroxaban (XARELTO) 20 MG TABS tablet Take 1 tablet (20 mg total) by mouth daily with supper. Start after 3 wk 15mg  dose completed Qty: 30 tablet, Refills: 3      STOP taking these medications     Calcium Carb-Cholecalciferol (CALCIUM-VITAMIN D) 600-400 MG-UNIT TABS        Allergies  Allergen Reactions  . Codeine     REACTION: DIZZY/RAPID HEARTBEAT  . Penicillins     REACTION: UNSPECIFIED   Follow-up Information    Ria Bush, MD. Schedule an appointment as soon as possible for a visit in 1 week(s).   Specialty:  Family Medicine Why:  Discuss blood thinners, Xarelto vs Eliquis.  Contact information: [place] [address] [phone]        Milus Banister, MD. Schedule an appointment as soon as possible for a visit in 1 week(s).   Specialty:  Gastroenterology Why:  With Dr. Ardis Hughs or his PA for rectal bleeding Contact information: [address] [place] [address] [phone]            The results of significant diagnostics from this hospitalization (including imaging, microbiology, ancillary and laboratory) are listed below for reference.    Significant Diagnostic Studies: No results found.  Microbiology: No results found for this or any previous visit (from the past 240 hour(s)).   Labs: Basic Metabolic Panel:  Recent Labs Lab 10/21/16 2220 10/22/16 0853  NA 138 141  K 3.3* 4.3  CL 102 107  CO2 28 27  GLUCOSE 106* 89  BUN 15 10  CREATININE 0.64 0.58  CALCIUM 10.1 9.5   Liver Function Tests:  Recent Labs Lab 10/21/16 2220  AST 20  ALT 12*  ALKPHOS 63  BILITOT 0.5  PROT 7.9  ALBUMIN 4.0   No results for input(s): LIPASE, AMYLASE in the last 168 hours. No results for input(s): AMMONIA in the last 168 hours. CBC:  Recent Labs Lab 10/21/16 2220 10/22/16 0240 10/22/16 0853  WBC 6.0 7.2 6.5  HGB 12.0 11.3* 11.3*  HCT 38.1 35.7* 36.0  MCV 90.1 89.9 89.8  PLT 394 365 373   Cardiac  Enzymes: No results for input(s): CKTOTAL, CKMB, CKMBINDEX, TROPONINI in the last 168 hours. BNP: BNP (last 3 results) No results for input(s): BNP in the last 8760 hours.  ProBNP (last 3 results) No results for input(s): PROBNP in the last 8760 hours.  CBG:  Recent Labs Lab 10/22/16 0805  GLUCAP 91       Signed:  [name], [name]  [facility] 10/22/2016, 12:40 PM
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
## 2016-10-22 NOTE — Care Management Note (Signed)
Case Management Note  Patient Details  Name: Kimberly Davies MRN: [id number] Date of Birth: August 10, 1941  Subjective/Objective:                 Spoke to patient and spouse at the bedside. Patient and spouse independent from home, express no difficulties with self care, obtaining or paying for medications, and both drive.  PCP Dr Dalbert Mayotte   Action/Plan:  Anticipate DC to home self care.  Expected Discharge Date:                  Expected Discharge Plan:  Home/Self Care  In-House Referral:  NA  Discharge planning Services  CM Consult  Post Acute Care Choice:    Choice offered to:     DME Arranged:    DME Agency:     HH Arranged:    HH Agency:     Status of Service:  Completed, signed off  If discussed at [employer] of Stay Meetings, dates discussed:    Additional Comments:  Carles Collet, RN 10/22/2016, 11:24 AM

## 2016-10-22 NOTE — Progress Notes (Signed)
[name] to be D/C'd Home per MD order.  Discussed with the patient and all questions fully answered.  VSS, Skin clean, dry and intact without evidence of skin break down, no evidence of skin tears noted. IV catheter discontinued intact. Site without signs and symptoms of complications. Dressing and pressure applied.  An After Visit Summary was printed and given to the patient. Patient received prescription.  D/c education completed with patient/family including follow up instructions, medication list, d/c activities limitations if indicated, with other d/c instructions as indicated by MD - patient able to verbalize understanding, all questions fully answered.   Patient instructed to return to ED, call 911, or call MD for any changes in condition.   Patient escorted via [place], and D/C home via private auto.  Luci Bank 10/22/2016 2:15 PM

## 2016-10-22 NOTE — Telephone Encounter (Signed)
Per chart review tab pt admitted to [facility] on 10/21/16.

## 2016-10-26 ENCOUNTER — Telehealth: Payer: Self-pay

## 2016-10-26 NOTE — Telephone Encounter (Signed)
Transition Care Management Follow-up Telephone Call   Date discharged? 10/22/16   How have you been since you were released from the hospital? "one leg still has pain, the right. The rectal bleeding is not as heavy"   Do you understand why you were in the hospital? yes   Do you understand the discharge instructions? yes   Where were you discharged to? Home.    Items Reviewed:  Medications reviewed: yes  Allergies reviewed: yes  Dietary changes reviewed: no, no changes  Referrals reviewed: no   Functional Questionnaire:   Activities of Daily Living (ADLs):   She states they are independent in the following: ambulation, bathing and hygiene, feeding, continence, grooming, toileting and dressing States they require assistance with the following: none   Any transportation issues/concerns?: no   Any patient concerns? no   Confirmed importance and date/time of follow-up visits scheduled yes  Provider Appointment booked with PCP 11/04/16.   Confirmed with patient if condition begins to worsen call PCP or go to the ER.  Patient was given the office number and encouraged to call back with question or concerns.  : yes

## 2016-11-03 ENCOUNTER — Encounter: Payer: Self-pay | Admitting: Physician Assistant

## 2016-11-03 ENCOUNTER — Ambulatory Visit (INDEPENDENT_AMBULATORY_CARE_PROVIDER_SITE_OTHER): Payer: Medicare Other | Admitting: Physician Assistant

## 2016-11-03 ENCOUNTER — Other Ambulatory Visit (INDEPENDENT_AMBULATORY_CARE_PROVIDER_SITE_OTHER): Payer: Medicare Other

## 2016-11-03 VITALS — BP 116/70 | HR 88 | Ht 62.0 in | Wt 103.1 lb

## 2016-11-03 DIAGNOSIS — Z7901 Long term (current) use of anticoagulants: Secondary | ICD-10-CM | POA: Diagnosis not present

## 2016-11-03 DIAGNOSIS — K5791 Diverticulosis of intestine, part unspecified, without perforation or abscess with bleeding: Secondary | ICD-10-CM

## 2016-11-03 DIAGNOSIS — K625 Hemorrhage of anus and rectum: Secondary | ICD-10-CM

## 2016-11-03 LAB — CBC WITH DIFFERENTIAL/PLATELET
BASOS PCT: 0.5 % (ref 0.0–3.0)
Basophils Absolute: 0 10*3/uL (ref 0.0–0.1)
EOS ABS: 0 10*3/uL (ref 0.0–0.7)
Eosinophils Relative: 0.4 % (ref 0.0–5.0)
HEMATOCRIT: 37.4 % (ref 36.0–46.0)
HEMOGLOBIN: 12.5 g/dL (ref 12.0–15.0)
LYMPHS PCT: 29.1 % (ref 12.0–46.0)
Lymphs Abs: 2.2 10*3/uL (ref 0.7–4.0)
MCHC: 33.3 g/dL (ref 30.0–36.0)
MCV: 86.6 fl (ref 78.0–100.0)
MONO ABS: 0.6 10*3/uL (ref 0.1–1.0)
Monocytes Relative: 8.7 % (ref 3.0–12.0)
Neutro Abs: 4.5 10*3/uL (ref 1.4–7.7)
Neutrophils Relative %: 61.3 % (ref 43.0–77.0)
Platelets: 332 10*3/uL (ref 150.0–400.0)
RBC: 4.32 Mil/uL (ref 3.87–5.11)
RDW: 13.6 % (ref 11.5–15.5)
WBC: 7.4 10*3/uL (ref 4.0–10.5)

## 2016-11-03 MED ORDER — HYDROCORTISONE ACETATE 25 MG RE SUPP: RECTAL | 1 refills | Status: DC
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
## 2016-11-03 NOTE — Patient Instructions (Addendum)
Please go to the basement level to have your labs drawn.  We sent a prescription for suppositories to [facility], [place].  Use 1 at bedtime for 7 days then as needed.  If they are too expensive you can get Preperation H suppositories from the pharmacy.   Use as needed for recurrent bleeding.

## 2016-11-04 ENCOUNTER — Encounter: Payer: Self-pay | Admitting: Family Medicine

## 2016-11-04 ENCOUNTER — Ambulatory Visit (INDEPENDENT_AMBULATORY_CARE_PROVIDER_SITE_OTHER): Payer: Medicare Other | Admitting: Family Medicine

## 2016-11-04 VITALS — BP 118/76 | HR 84 | Temp 97.7°F | Wt 104.5 lb

## 2016-11-04 DIAGNOSIS — I824Z2 Acute embolism and thrombosis of unspecified deep veins of left distal lower extremity: Secondary | ICD-10-CM | POA: Diagnosis not present

## 2016-11-04 DIAGNOSIS — K625 Hemorrhage of anus and rectum: Secondary | ICD-10-CM | POA: Diagnosis not present

## 2016-11-04 DIAGNOSIS — K5730 Diverticulosis of large intestine without perforation or abscess without bleeding: Secondary | ICD-10-CM | POA: Diagnosis not present

## 2016-11-04 NOTE — Assessment & Plan Note (Signed)
Presumed diverticular bleed, now resolved. Hgb stable.  I touched base with pharmacist regarding eliquis vs xarelto - no significant change in bleeding risk on these two medications that she was aware of either.  Will continue xarelto 20mg  daily, RTC 34mo to discuss stopping anticoagulation after full 3 month course. Pt agrees with plan.

## 2016-11-04 NOTE — Assessment & Plan Note (Signed)
LLE doing well without significant pain.

## 2016-11-04 NOTE — Progress Notes (Signed)
BP 118/76   Pulse 84   Temp 97.7 F (36.5 C) (Oral)   Wt 104 lb 8 oz (47.4 kg)   BMI 19.11 kg/m    CC: hosp f/u visit Subjective:    Patient ID: Kimberly Davies, female    DOB: [birth year], 75 y.o.   MRN: [id number]  HPI: Kimberly Davies is a 75 y.o. female presenting on 11/04/2016 for Follow-up (hospital)   Recent hospitalization for rectal bleed after staring xarelto for DVT. Dosing had just been decreased from 30mg  to 20mg  daily. Saw GI in f/u yesterday - Hgb stable at 12.5. Attributed to self limited diverticular bleed vs hemorrhoidal bleed, now resolved. UTD colonoscopy. Currently regular soft stools.   Admit date: 10/21/2016 Discharge date: 10/22/2016 TCM f/u phone call completed 10/26/2016  Recommendations for Outpatient Follow-up:  Patient will be discharged to home.  Patient will need to follow up with primary care provider within one week of discharge, repeat CBC and discuss blood thinners..  Follow up with gastroenterology, Dr. Ardis Hughs.  Patient should continue medications as prescribed.  Patient should follow a regular diet.   Discharge Diagnoses:  Rectal Bleeding Recent LLE DVT Hypokalemia   Discharge Condition: Stable  Diet recommendation: regular  Relevant past medical, surgical, family and social history reviewed and updated as indicated. Interim medical history since our last visit reviewed. Allergies and medications reviewed and updated. Current Outpatient Prescriptions on File Prior to Visit  Medication Sig  . alendronate (FOSAMAX) 70 MG tablet Take 1 tablet (70 mg total) by mouth every 7 (seven) days. Take with a full glass of water on an empty stomach.  . rivaroxaban (XARELTO) 20 MG TABS tablet Take 1 tablet (20 mg total) by mouth daily with supper. Start after 3 wk 15mg  dose completed   No current facility-administered medications on file prior to visit.     Review of Systems Per HPI unless specifically indicated in ROS section     Objective:    BP 118/76   Pulse 84   Temp 97.7 F (36.5 C) (Oral)   Wt 104 lb 8 oz (47.4 kg)   BMI 19.11 kg/m   Wt Readings from Last 3 Encounters:  11/04/16 104 lb 8 oz (47.4 kg)  11/03/16 103 lb 2 oz (46.8 kg)  10/22/16 104 lb (47.2 kg)    Physical Exam  Constitutional: She appears well-developed and well-nourished. No distress.  HENT:  Head: Normocephalic and atraumatic.  Mouth/Throat: Oropharynx is clear and moist. No oropharyngeal exudate.  Cardiovascular: Normal rate, regular rhythm, normal heart sounds and intact distal pulses.   No murmur heard. Pulmonary/Chest: Effort normal and breath sounds normal. No respiratory distress. She has no wheezes. She has no rales.  Abdominal: Soft. Bowel sounds are normal. She exhibits no distension and no mass. There is no tenderness. There is no rebound and no guarding.  Musculoskeletal: She exhibits no edema.  Skin: Skin is warm and dry. No rash noted.  Psychiatric: She has a normal mood and affect.  Nursing note and vitals reviewed.  Results for orders placed or performed in visit on 11/03/16  CBC with Differential/Platelet  Result Value Ref Range   WBC 7.4 4.0 - 10.5 K/uL   RBC 4.32 3.87 - 5.11 Mil/uL   Hemoglobin 12.5 12.0 - 15.0 g/dL   HCT 37.4 36.0 - 46.0 %   MCV 86.6 78.0 - 100.0 fl   MCHC 33.3 30.0 - 36.0 g/dL   RDW 13.6 11.5 - 15.5 %   Platelets  332.0 150.0 - 400.0 K/uL   Neutrophils Relative % 61.3 43.0 - 77.0 %   Lymphocytes Relative 29.1 12.0 - 46.0 %   Monocytes Relative 8.7 3.0 - 12.0 %   Eosinophils Relative 0.4 0.0 - 5.0 %   Basophils Relative 0.5 0.0 - 3.0 %   Neutro Abs 4.5 1.4 - 7.7 K/uL   Lymphs Abs 2.2 0.7 - 4.0 K/uL   Monocytes Absolute 0.6 0.1 - 1.0 K/uL   Eosinophils Absolute 0.0 0.0 - 0.7 K/uL   Basophils Absolute 0.0 0.0 - 0.1 K/uL      Assessment & Plan:   Problem List Items Addressed This Visit    Acute deep vein thrombosis (DVT) of distal end of left lower extremity (HCC)    LLE doing well without  significant pain.      Diverticulosis of colon   Rectal bleeding - Primary    Presumed diverticular bleed, now resolved. Hgb stable.  I touched base with pharmacist regarding eliquis vs xarelto - no significant change in bleeding risk on these two medications that she was aware of either.  Will continue xarelto 20mg  daily, RTC 45mo to discuss stopping anticoagulation after full 3 month course. Pt agrees with plan.          Follow up plan: Return in about 10 weeks (around 01/13/2017) for follow up visit.  Ria Bush, MD

## 2016-11-04 NOTE — Progress Notes (Signed)
I agree with the above note, plan 

## 2016-11-04 NOTE — Progress Notes (Signed)
Pre visit review using our clinic review tool, if applicable. No additional management support is needed unless otherwise documented below in the visit note. 

## 2016-11-04 NOTE — Patient Instructions (Addendum)
Good to see you today. You are doing well today. Continue xarelto 20mg  daily.  Return in February for follow up.

## 2016-11-17 ENCOUNTER — Ambulatory Visit
Admission: RE | Admit: 2016-11-17 | Discharge: 2016-11-17 | Disposition: A | Discharge status: Home / Self Care | Payer: Medicare Other | Source: Ambulatory Visit | Attending: Family Medicine | Admitting: Family Medicine

## 2016-11-17 ENCOUNTER — Telehealth: Payer: Self-pay

## 2016-11-17 DIAGNOSIS — M810 Age-related osteoporosis without current pathological fracture: Secondary | ICD-10-CM

## 2016-11-17 DIAGNOSIS — Z1239 Encounter for other screening for malignant neoplasm of breast: Secondary | ICD-10-CM

## 2016-11-17 DIAGNOSIS — Z780 Asymptomatic menopausal state: Secondary | ICD-10-CM | POA: Diagnosis not present

## 2016-11-17 DIAGNOSIS — Z1231 Encounter for screening mammogram for malignant neoplasm of breast: Secondary | ICD-10-CM | POA: Diagnosis not present

## 2016-11-17 LAB — HM MAMMOGRAPHY

## 2016-11-17 NOTE — Telephone Encounter (Signed)
PLEASE NOTE: All timestamps contained within this report are represented as [place] Standard Time. CONFIDENTIALTY NOTICE: This fax transmission is intended only for the addressee. It contains information that is legally privileged, confidential or otherwise protected from use or disclosure. If you are not the intended recipient, you are strictly prohibited from reviewing, disclosing, copying using or disseminating any of this information or taking any action in reliance on or regarding this information. If you have received this fax in error, please notify us immediately by telephone so that we can arrange for its return to [place]. Phone: [phone], Toll-Free: [phone], Fax: [phone] Page: 1 of 1 Call Id: [id number] [place] [facility] - Client Nonclinical Telephone Record [place] [facility] - Client Client Site [place] Physician Ria Bush - MD Contact Type Call Who Is Calling Patient / Member / Family / Caregiver Caller Name [place] Phone Number [phone] Patient Name Kimberly Davies Call Type Message Only Information Provided Reason for Call Request for General Office Information Initial Comment caller states she wants to let the office that she went in for her dexa scan today but they aren't sure if they did everything Dr Darnell Level wanted. She would like a call back to let her know if she needs to go back and have more scans done. Additional Comment DOB: 1941-08-27 Call Closed By: [place] [facility] Transaction Date/Time: 11/17/2016 10:40:22 AM (ET)

## 2016-11-17 NOTE — Telephone Encounter (Signed)
We can await the results of the testing, routed to Dr. Darnell Level in the meantime for his input when results are available. Thanks.

## 2016-11-17 NOTE — Telephone Encounter (Signed)
Wanted mammogram and dexa. Looks like both were done. Will await results.

## 2016-11-18 ENCOUNTER — Encounter: Payer: Self-pay | Admitting: *Deleted

## 2016-11-18 NOTE — Telephone Encounter (Signed)
Patient notified

## 2016-11-23 ENCOUNTER — Ambulatory Visit: Payer: Medicare Other | Admitting: Internal Medicine

## 2017-01-06 ENCOUNTER — Other Ambulatory Visit: Payer: Self-pay | Admitting: Family Medicine

## 2017-01-14 ENCOUNTER — Encounter: Payer: Self-pay | Admitting: Family Medicine

## 2017-01-14 ENCOUNTER — Ambulatory Visit (INDEPENDENT_AMBULATORY_CARE_PROVIDER_SITE_OTHER): Payer: Medicare Other | Admitting: Family Medicine

## 2017-01-14 ENCOUNTER — Encounter (INDEPENDENT_AMBULATORY_CARE_PROVIDER_SITE_OTHER): Payer: Self-pay

## 2017-01-14 VITALS — BP 110/76 | HR 80 | Temp 97.9°F | Wt 104.8 lb

## 2017-01-14 DIAGNOSIS — M810 Age-related osteoporosis without current pathological fracture: Secondary | ICD-10-CM | POA: Diagnosis not present

## 2017-01-14 DIAGNOSIS — I824Z2 Acute embolism and thrombosis of unspecified deep veins of left distal lower extremity: Secondary | ICD-10-CM

## 2017-01-14 DIAGNOSIS — I8390 Asymptomatic varicose veins of unspecified lower extremity: Secondary | ICD-10-CM

## 2017-01-14 NOTE — Patient Instructions (Addendum)
We competed 3 months of blood thinner - ok to stop. Monitor for recurrent symptoms of leg pain or swelling.  Return in 2 weeks for labs to check for other causes of increased clotting risk.  May try colace stool softener.

## 2017-01-14 NOTE — Assessment & Plan Note (Signed)
Stable on fosamax. Upcoming dental cleaning.

## 2017-01-14 NOTE — Progress Notes (Signed)
Pre visit review using our clinic review tool, if applicable. No additional management support is needed unless otherwise documented below in the visit note. 

## 2017-01-14 NOTE — Progress Notes (Signed)
BP 110/76   Pulse 80   Temp 97.9 F (36.6 C) (Oral)   Wt 104 lb 12 oz (47.5 kg)   BMI 19.16 kg/m    CC: 3mo f/u visit Subjective:    Patient ID: Kimberly Davies, female    DOB: 1941-07-02, 76 y.o.   MRN: [id number]  HPI: Kimberly Davies is a 76 y.o. female presenting on 01/14/2017 for Follow-up   Husband just retired.  Here for f/u after completing 3 months of xarelto after initial provoked symptomatic distal DVT in setting of increased driving to [place] (5 hr drive). Some burning in L lower leg when she awakens but otherwise feels well. No edema, erythema. She does have family history of blood clots as well.   Ongoing hemorrhoids.   LE venous ultrasound: Acute DVT of the left gastrocnemius veins. Acute SVT of the left lesser saphenous vein, beginning approximately 0.4- 0.5cm from its take-off from the popliteal vein  Colonoscopy UTD Mammogram UTD fmhx bladder cancer - UA WNL 04/2016.   Relevant past medical, surgical, family and social history reviewed and updated as indicated. Interim medical history since our last visit reviewed. Allergies and medications reviewed and updated. Current Outpatient Prescriptions on File Prior to Visit  Medication Sig  . alendronate (FOSAMAX) 70 MG tablet TAKE 1 TABLET BY MOUTH EVERY 7 DAYS W/ FULL GLASS OF WATER ON AN EMPTY STOMACH  . rivaroxaban (XARELTO) 20 MG TABS tablet Take 1 tablet (20 mg total) by mouth daily with supper. Start after 3 wk 15mg  dose completed   No current facility-administered medications on file prior to visit.     Review of Systems Per HPI unless specifically indicated in ROS section     Objective:    BP 110/76   Pulse 80   Temp 97.9 F (36.6 C) (Oral)   Wt 104 lb 12 oz (47.5 kg)   BMI 19.16 kg/m   Wt Readings from Last 3 Encounters:  01/14/17 104 lb 12 oz (47.5 kg)  11/04/16 104 lb 8 oz (47.4 kg)  11/03/16 103 lb 2 oz (46.8 kg)    Physical Exam  Constitutional: She appears well-developed and  well-nourished. No distress.  HENT:  Mouth/Throat: Oropharynx is clear and moist. No oropharyngeal exudate.  Cardiovascular: Normal rate, regular rhythm, normal heart sounds and intact distal pulses.   No murmur heard. Pulmonary/Chest: Effort normal and breath sounds normal. No respiratory distress. She has no wheezes. She has no rales.  Musculoskeletal: She exhibits no edema.  No palpable cords Small varicose veins  2+ R DP, 1+ L DP No pedal edema  Skin: Skin is warm and dry. No rash noted. No erythema.  Nursing note and vitals reviewed.  Results for orders placed or performed in visit on 11/18/16  HM MAMMOGRAPHY  Result Value Ref Range   HM Mammogram 0-4 Bi-Rad 0-4 Bi-Rad, Self Reported Normal      Assessment & Plan:   Problem List Items Addressed This Visit    Acute deep vein thrombosis (DVT) of distal end of left lower extremity ([place]) - Primary    Completed 3 months of anticoagulation with xarelto. Will stop. RTC 2 wks labs for hypercoagulability workup. Pt agrees with plan. She will take breaks on long car rides.       Osteoporosis    Stable on fosamax. Upcoming dental cleaning.       VARICOSE VEINS, LOWER EXTREMITIES       Follow up plan: Return in about 4 months (  around 05/14/2017), or if symptoms worsen or fail to improve, for medicare wellness visit.  Ria Bush, MD

## 2017-01-14 NOTE — Assessment & Plan Note (Signed)
Completed 3 months of anticoagulation with xarelto. Will stop. RTC 2 wks labs for hypercoagulability workup. Pt agrees with plan. She will take breaks on long car rides.

## 2017-01-31 ENCOUNTER — Other Ambulatory Visit (INDEPENDENT_AMBULATORY_CARE_PROVIDER_SITE_OTHER): Payer: Medicare Other

## 2017-01-31 DIAGNOSIS — I824Z2 Acute embolism and thrombosis of unspecified deep veins of left distal lower extremity: Secondary | ICD-10-CM

## 2017-01-31 DIAGNOSIS — R7989 Other specified abnormal findings of blood chemistry: Secondary | ICD-10-CM | POA: Diagnosis not present

## 2017-02-01 LAB — D-DIMER, QUANTITATIVE: D-Dimer, Quant: 0.53 mcg/mL FEU — ABNORMAL HIGH (ref <0.50)

## 2017-02-03 LAB — RFX DRVVT SCR W/RFLX CONF 1:1 MIX: DRVVT SCREEN: 24 s (ref <=45)

## 2017-02-03 LAB — RFX PTT-LA W/RFX TO HEX PHASE CONF: PTT-LA Screen: 33 s (ref <=40)

## 2017-02-05 LAB — HYPERCOAGULABLE PANEL, COMPREHENSIVE
AntiThromb III Func: 126 % activity — ABNORMAL HIGH (ref 80–120)
Anticardiolipin IgM: <12 [MPL'U]
Beta-2 Glyco I IgG: 9 SGU (ref <=20)
Beta-2-Glycoprotein I IgM: <9 SMU (ref <=20)
PROTEIN C ACTIVITY: 169 % (ref 70–180)
PROTEIN S ACTIVITY: 87 % (ref 60–140)
PROTEIN S ANTIGEN, TOTAL: 112 % (ref 70–140)
Protein C Antigen: 137 % (ref 70–140)

## 2017-02-06 ENCOUNTER — Other Ambulatory Visit: Payer: Self-pay | Admitting: Family Medicine

## 2017-02-06 ENCOUNTER — Encounter: Payer: Self-pay | Admitting: Family Medicine

## 2017-02-07 ENCOUNTER — Encounter: Payer: Self-pay | Admitting: *Deleted

## 2017-02-15 ENCOUNTER — Telehealth: Payer: Self-pay

## 2017-02-15 NOTE — Telephone Encounter (Signed)
Pt notified of Dr. Marliss Coots comments and will await Dr. Synthia Innocent return

## 2017-02-15 NOTE — Telephone Encounter (Signed)
I think it is ok to re start vitamin and calcium  I will cc to Dr Darnell Level for when he returns regarding length of treatment question  thanks

## 2017-02-15 NOTE — Telephone Encounter (Signed)
Pt left v/m; pt wants to know if pt should restart Vit B 12 and Calcium 1200. Pt had been on Xarelto for a blood clot and pt wants cb to see if can restart vitamin and calcium. Pt last seen 01/14/17 and was noted at that visit that pt had completed 3 months anticoagulation with Xarelto.Please advise.

## 2017-02-20 MED ORDER — VITAMIN D 50 MCG (2000 UT) PO CAPS: 1.0000 | ORAL_CAPSULE | Freq: Every day | ORAL | Status: DC

## 2017-02-20 MED ORDER — CYANOCOBALAMIN 500 MCG PO TABS: 500.0000 ug | ORAL_TABLET | Freq: Every day | ORAL | Status: DC

## 2017-02-20 MED ORDER — CALCIUM CARBONATE-VITAMIN D 600-400 MG-UNIT PO CHEW: 1.0000 | CHEWABLE_TABLET | Freq: Every day | ORAL | Status: DC

## 2017-02-20 NOTE — Addendum Note (Signed)
Addended by: Ria Bush on: 02/20/2017 12:28 PM   Modules accepted: Orders

## 2017-02-20 NOTE — Telephone Encounter (Signed)
Agree. May restart calcium, vit D 2000 IU daily, vit B12 573mcg daily.   Try to get most or all of your calcium from your food--aim for 1200 mg/day for women over 54 and men over 70. To figure out dietary calcium: 300 mg/day from all non dairy foods plus 300 mg per cup of milk, other dairy, or fortified juice. Non dairy foods that contain calcium: Kale, oranges, sardines, oatmeal, soy milk/soybeans, salmon, white beans, dried figs, turnip greens, almonds, broccoli, tofu.  If not receiving 1200mg  calcium daily in diet, recommend add calcium/vit D tab 600/400 daily.   Completed 3 mo xarelto - this should suffice .

## 2017-02-23 NOTE — Telephone Encounter (Signed)
Patient notified

## 2017-04-13 ENCOUNTER — Ambulatory Visit (INDEPENDENT_AMBULATORY_CARE_PROVIDER_SITE_OTHER): Payer: Medicare Other

## 2017-04-13 VITALS — BP 108/72 | HR 82 | Temp 98.0°F | Ht 62.0 in | Wt 107.5 lb

## 2017-04-13 DIAGNOSIS — R946 Abnormal results of thyroid function studies: Secondary | ICD-10-CM | POA: Diagnosis not present

## 2017-04-13 DIAGNOSIS — E784 Other hyperlipidemia: Secondary | ICD-10-CM | POA: Diagnosis not present

## 2017-04-13 DIAGNOSIS — E538 Deficiency of other specified B group vitamins: Secondary | ICD-10-CM

## 2017-04-13 DIAGNOSIS — Z0000 Encounter for general adult medical examination without abnormal findings: Secondary | ICD-10-CM | POA: Diagnosis not present

## 2017-04-13 DIAGNOSIS — E559 Vitamin D deficiency, unspecified: Secondary | ICD-10-CM

## 2017-04-13 DIAGNOSIS — E7849 Other hyperlipidemia: Secondary | ICD-10-CM

## 2017-04-13 DIAGNOSIS — R7989 Other specified abnormal findings of blood chemistry: Secondary | ICD-10-CM

## 2017-04-13 LAB — LIPID PANEL
CHOLESTEROL: 224 mg/dL — AB (ref 0–200)
HDL: 83.6 mg/dL (ref >39.00)
LDL Cholesterol: 127 mg/dL — ABNORMAL HIGH (ref 0–99)
NonHDL: 140.82
TRIGLYCERIDES: 69 mg/dL (ref 0.0–149.0)
Total CHOL/HDL Ratio: 3
VLDL: 13.8 mg/dL (ref 0.0–40.0)

## 2017-04-13 LAB — BASIC METABOLIC PANEL
BUN: 16 mg/dL (ref 6–23)
CO2: 33 mEq/L — ABNORMAL HIGH (ref 19–32)
Calcium: 10.4 mg/dL (ref 8.4–10.5)
Chloride: 104 mEq/L (ref 96–112)
Creatinine, Ser: 0.77 mg/dL (ref 0.40–1.20)
GFR: 78.02 mL/min (ref >60.00)
Glucose, Bld: 96 mg/dL (ref 70–99)
POTASSIUM: 5.4 meq/L — AB (ref 3.5–5.1)
SODIUM: 142 meq/L (ref 135–145)

## 2017-04-13 LAB — TSH: TSH: 2.41 u[IU]/mL (ref 0.35–4.50)

## 2017-04-13 LAB — VITAMIN D 25 HYDROXY (VIT D DEFICIENCY, FRACTURES): VITD: 29.68 ng/mL — AB (ref 30.00–100.00)

## 2017-04-13 LAB — VITAMIN B12: VITAMIN B 12: 540 pg/mL (ref 211–911)

## 2017-04-13 NOTE — Progress Notes (Signed)
I reviewed health advisor's note, was available for consultation, and agree with documentation and plan.  

## 2017-04-13 NOTE — Patient Instructions (Signed)
Ms. Sigel , Thank you for taking time to come for your Medicare Wellness Visit. I appreciate your ongoing commitment to your health goals. Please review the following plan we discussed and let me know if I can assist you in the future.   These are the goals we discussed: Goals    . Increase physical activity          Starting 04/23/2017, I will continue walking 30 min daily.        This is a list of the screening recommended for you and due dates:  Health Maintenance  Topic Date Due  . DTaP/Tdap/Td vaccine (1 - Tdap) 05/13/2018*  . Flu Shot  07/06/2017  . Mammogram  11/17/2017  . Tetanus Vaccine  05/13/2018  . Colon Cancer Screening  08/08/2023  . DEXA scan (bone density measurement)  Completed  . Pneumonia vaccines  Completed  *Topic was postponed. The date shown is not the original due date.   Preventive Care for Adults  A healthy lifestyle and preventive care can promote health and wellness. Preventive health guidelines for adults include the following key practices.  . A routine yearly physical is a good way to check with your health care provider about your health and preventive screening. It is a chance to share any concerns and updates on your health and to receive a thorough exam.  . Visit your dentist for a routine exam and preventive care every 6 months. Brush your teeth twice a day and floss once a day. Good oral hygiene prevents tooth decay and gum disease.  . The frequency of eye exams is based on your age, health, family medical history, use  of contact lenses, and other factors. Follow your health care provider's ecommendations for frequency of eye exams.  . Eat a healthy diet. Foods like vegetables, fruits, whole grains, low-fat dairy products, and lean protein foods contain the nutrients you need without too many calories. Decrease your intake of foods high in solid fats, added sugars, and salt. Eat the right amount of calories for you. Get information about a  proper diet from your health care provider, if necessary.  . Regular physical exercise is one of the most important things you can do for your health. Most adults should get at least 150 minutes of moderate-intensity exercise (any activity that increases your heart rate and causes you to sweat) each week. In addition, most adults need muscle-strengthening exercises on 2 or more days a week.  Silver Sneakers may be a benefit available to you. To determine eligibility, you may visit the website: www.silversneakers.com or contact program at (727)409-2898 Mon-Fri between 8AM-8PM.   . Maintain a healthy weight. The body mass index (BMI) is a screening tool to identify possible weight problems. It provides an estimate of body fat based on height and weight. Your health care provider can find your BMI and can help you achieve or maintain a healthy weight.   For adults 20 years and older: ? A BMI below 18.5 is considered underweight. ? A BMI of 18.5 to 24.9 is normal. ? A BMI of 25 to 29.9 is considered overweight. ? A BMI of 30 and above is considered obese.   . Maintain normal blood lipids and cholesterol levels by exercising and minimizing your intake of saturated fat. Eat a balanced diet with plenty of fruit and vegetables. Blood tests for lipids and cholesterol should begin at age 74 and be repeated every 5 years. If your lipid or cholesterol levels are  high, you are over 50, or you are at high risk for heart disease, you may need your cholesterol levels checked more frequently. Ongoing high lipid and cholesterol levels should be treated with medicines if diet and exercise are not working.  . If you smoke, find out from your health care provider how to quit. If you do not use tobacco, please do not start.  . If you choose to drink alcohol, please do not consume more than 2 drinks per day. One drink is considered to be 12 ounces (355 mL) of beer, 5 ounces (148 mL) of wine, or 1.5 ounces (44 mL) of  liquor.  . If you are 70-72 years old, ask your health care provider if you should take aspirin to prevent strokes.  . Use sunscreen. Apply sunscreen liberally and repeatedly throughout the day. You should seek shade when your shadow is shorter than you. Protect yourself by wearing long sleeves, pants, a wide-brimmed hat, and sunglasses year round, whenever you are outdoors.  . Once a month, do a whole body skin exam, using a mirror to look at the skin on your back. Tell your health care provider of new moles, moles that have irregular borders, moles that are larger than a pencil eraser, or moles that have changed in shape or color.
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
## 2017-04-13 NOTE — Progress Notes (Signed)
PCP notes:   Health maintenance:  No gaps identified.   Abnormal screenings:   Hearing - failed Mini-Cog score: 18/20  Patient concerns:   None  Nurse concerns:  None  Next PCP appt:   04/20/17 @ 0830

## 2017-04-13 NOTE — Progress Notes (Signed)
Pre visit review using our clinic review tool, if applicable. No additional management support is needed unless otherwise documented below in the visit note. 

## 2017-04-20 ENCOUNTER — Encounter: Payer: Self-pay | Admitting: Family Medicine

## 2017-04-20 ENCOUNTER — Ambulatory Visit (INDEPENDENT_AMBULATORY_CARE_PROVIDER_SITE_OTHER): Payer: Medicare Other | Admitting: Family Medicine

## 2017-04-20 VITALS — BP 102/60 | HR 77 | Temp 98.3°F | Ht 62.0 in | Wt 108.0 lb

## 2017-04-20 DIAGNOSIS — E875 Hyperkalemia: Secondary | ICD-10-CM | POA: Diagnosis not present

## 2017-04-20 DIAGNOSIS — K625 Hemorrhage of anus and rectum: Secondary | ICD-10-CM

## 2017-04-20 DIAGNOSIS — R0989 Other specified symptoms and signs involving the circulatory and respiratory systems: Secondary | ICD-10-CM

## 2017-04-20 DIAGNOSIS — Z7189 Other specified counseling: Secondary | ICD-10-CM | POA: Diagnosis not present

## 2017-04-20 DIAGNOSIS — E784 Other hyperlipidemia: Secondary | ICD-10-CM

## 2017-04-20 DIAGNOSIS — E559 Vitamin D deficiency, unspecified: Secondary | ICD-10-CM | POA: Diagnosis not present

## 2017-04-20 DIAGNOSIS — E538 Deficiency of other specified B group vitamins: Secondary | ICD-10-CM | POA: Diagnosis not present

## 2017-04-20 DIAGNOSIS — M810 Age-related osteoporosis without current pathological fracture: Secondary | ICD-10-CM | POA: Diagnosis not present

## 2017-04-20 DIAGNOSIS — E7849 Other hyperlipidemia: Secondary | ICD-10-CM

## 2017-04-20 LAB — POTASSIUM: Potassium: 4.1 mEq/L (ref 3.5–5.1)

## 2017-04-20 NOTE — Assessment & Plan Note (Signed)
Improving with replacement - continue 2000 IU daily

## 2017-04-20 NOTE — Assessment & Plan Note (Signed)
None appreciated today.

## 2017-04-20 NOTE — Assessment & Plan Note (Signed)
Stable on oral replacement. Continue.

## 2017-04-20 NOTE — Patient Instructions (Addendum)
Recheck potassium today.  Consider switch from fosamax to prolia - see below.  Work on Scientist, physiological.  Return as needed or in 6 months for follow up visit.  Denosumab injection What is this medicine? DENOSUMAB (den oh sue mab) slows bone breakdown. Prolia is used to treat osteoporosis in women after menopause and in men. Delton See is used to treat a high calcium level due to cancer and to prevent bone fractures and other bone problems caused by multiple myeloma or cancer bone metastases. Delton See is also used to treat giant cell tumor of the bone. This medicine may be used for other purposes; ask your health care provider or pharmacist if you have questions. COMMON BRAND NAME(S): Prolia, XGEVA What should I tell my health care provider before I take this medicine? They need to know if you have any of these conditions: -dental disease -having surgery or tooth extraction -infection -kidney disease -low levels of calcium or Vitamin D in the blood -malnutrition -on hemodialysis -skin conditions or sensitivity -thyroid or parathyroid disease -an unusual reaction to denosumab, other medicines, foods, dyes, or preservatives -pregnant or trying to get pregnant -breast-feeding How should I use this medicine? This medicine is for injection under the skin. It is given by a health care professional in a hospital or clinic setting. If you are getting Prolia, a special MedGuide will be given to you by the pharmacist with each prescription and refill. Be sure to read this information carefully each time. For Prolia, talk to your pediatrician regarding the use of this medicine in children. Special care may be needed. For Delton See, talk to your pediatrician regarding the use of this medicine in children. While this drug may be prescribed for children as young as 13 years for selected conditions, precautions do apply. Overdosage: If you think you have taken too much of this medicine contact a poison control  center or emergency room at once. NOTE: This medicine is only for you. Do not share this medicine with others. What if I miss a dose? It is important not to miss your dose. Call your doctor or health care professional if you are unable to keep an appointment. What may interact with this medicine? Do not take this medicine with any of the following medications: -other medicines containing denosumab This medicine may also interact with the following medications: -medicines that lower your chance of fighting infection -steroid medicines like prednisone or cortisone This list may not describe all possible interactions. Give your health care provider a list of all the medicines, herbs, non-prescription drugs, or dietary supplements you use. Also tell them if you smoke, drink alcohol, or use illegal drugs. Some items may interact with your medicine. What should I watch for while using this medicine? Visit your doctor or health care professional for regular checks on your progress. Your doctor or health care professional may order blood tests and other tests to see how you are doing. Call your doctor or health care professional for advice if you get a fever, chills or sore throat, or other symptoms of a cold or flu. Do not treat yourself. This drug may decrease your body's ability to fight infection. Try to avoid being around people who are sick. You should make sure you get enough calcium and vitamin D while you are taking this medicine, unless your doctor tells you not to. Discuss the foods you eat and the vitamins you take with your health care professional. See your dentist regularly. Brush and floss your teeth  as directed. Before you have any dental work done, tell your dentist you are receiving this medicine. Do not become pregnant while taking this medicine or for 5 months after stopping it. Talk with your doctor or health care professional about your birth control options while taking this medicine.  Women should inform their doctor if they wish to become pregnant or think they might be pregnant. There is a potential for serious side effects to an unborn child. Talk to your health care professional or pharmacist for more information. What side effects may I notice from receiving this medicine? Side effects that you should report to your doctor or health care professional as soon as possible: -allergic reactions like skin rash, itching or hives, swelling of the face, lips, or tongue -bone pain -breathing problems -dizziness -jaw pain, especially after dental work -redness, blistering, peeling of the skin -signs and symptoms of infection like fever or chills; cough; sore throat; pain or trouble passing urine -signs of low calcium like fast heartbeat, muscle cramps or muscle pain; pain, tingling, numbness in the hands or feet; seizures -unusual bleeding or bruising -unusually weak or tired Side effects that usually do not require medical attention (report to your doctor or health care professional if they continue or are bothersome): -constipation -diarrhea -headache -joint pain -loss of appetite -muscle pain -runny nose -tiredness -upset stomach This list may not describe all possible side effects. Call your doctor for medical advice about side effects. You may report side effects to FDA at 1-800-FDA-1088. Where should I keep my medicine? This medicine is only given in a clinic, doctor's office, or other health care setting and will not be stored at home. NOTE: This sheet is a summary. It may not cover all possible information. If you have questions about this medicine, talk to your doctor, pharmacist, or health care provider.  2018 Elsevier/Gold Standard (2016-12-14 19:17:21)

## 2017-04-20 NOTE — Assessment & Plan Note (Signed)
Stable on fosamax, has been on medication for last 8-9 yrs. Discussed change in therapy - prolia handout provided.

## 2017-04-20 NOTE — Progress Notes (Signed)
BP 102/60   Pulse 77   Temp 98.3 F (36.8 C) (Oral)   Ht 5\' 2"  (1.575 m)   Wt 108 lb (49 kg)   SpO2 99%   BMI 19.75 kg/m    CC: AMW f/u visit Subjective:    Patient ID: Kimberly Davies, female    DOB: 03/22/1941, 76 y.o.   MRN: [id number]  HPI: Kimberly Davies is a 76 y.o. female presenting on 04/20/2017 for Annual Exam   Saw Kimberly Davies last week for medicare wellness visit. Note reviewed.    Recent acute LLE DVT completed 3 mo xarelto. Hypercoagulability panel was normal Osteoporosis - latest DEXA 11/2016 with mild improvement. Has taken bisphosphonate for ~9 yrs now.  Preventative: COLONOSCOPY Date: 08/2013 small diverticula o/w normal, rpt 10 yrs Kimberly Davies)  Well woman exam - no fmhx cervical cancer.  Mammo - 11/2016 birads 1 Dexa Date: 02/2014 T score -3.1 at spine and -2.8 at hip Flu shot yearly  Pneumovax 2011, prevnar 2015 Tetanus 2009. Zostavax 2012 Shingrix discussed Advanced directives: Has info at home. Has not filled out. Would want sons to be HCPOA. Seat belt use discussed Sunscreen use discussed. No changing moles on skin. Non smoker Alcohol - none  Caffeine: none Lives with husband, 2 grown children. 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 Interim medical history since our last visit reviewed. Allergies and medications reviewed and updated. Outpatient Medications Prior to Visit  Medication Sig Dispense Refill  . alendronate (FOSAMAX) 70 MG tablet TAKE 1 TABLET BY MOUTH EVERY 7 DAYS W/ FULL GLASS OF WATER ON AN EMPTY STOMACH 4 tablet 11  . Cholecalciferol (VITAMIN D) 2000 units CAPS Take 1 capsule (2,000 Units total) by mouth daily. 30 capsule   . cyanocobalamin (V-R VITAMIN B-12) 500 MCG tablet Take 1 tablet (500 mcg total) by mouth daily.     No facility-administered  medications prior to visit.      Per HPI unless specifically indicated in ROS section below Review of Systems     Objective:    BP 102/60   Pulse 77   Temp 98.3 F (36.8 C) (Oral)   Ht 5\' 2"  (1.575 m)   Wt 108 lb (49 kg)   SpO2 99%   BMI 19.75 kg/m   Wt Readings from Last 3 Encounters:  04/20/17 108 lb (49 kg)  04/13/17 107 lb 8 oz (48.8 kg)  01/14/17 104 lb 12 oz (47.5 kg)    Physical Exam  Constitutional: She is oriented to person, place, and time. She appears well-developed and well-nourished. No distress.  HENT:  Head: Normocephalic and atraumatic.  Right Ear: Hearing, tympanic membrane and ear canal normal.  Left Ear: Hearing, tympanic membrane and ear canal normal.  Mouth/Throat: Uvula is midline, oropharynx is clear and moist and mucous membranes are normal. No oropharyngeal exudate, posterior oropharyngeal edema or posterior oropharyngeal erythema.  Eyes: Conjunctivae and EOM are normal. Pupils are equal, round, and reactive to light. No scleral icterus.  Neck: Normal range of motion. Neck supple.  Cardiovascular: Normal rate, regular rhythm, normal heart sounds and intact distal pulses.   No murmur heard. Pulses:      Radial pulses are 2+ on the right side, and 2+ on the left side.  Pulmonary/Chest: Effort normal and breath sounds normal. No respiratory distress. She has no wheezes.  She has no rales.  Abdominal: Soft. Bowel sounds are normal. She exhibits no distension and no mass. There is no tenderness. There is no rebound and no guarding.  Musculoskeletal: Normal range of motion. She exhibits no edema.  Lymphadenopathy:    She has no cervical adenopathy.  Neurological: She is alert and oriented to person, place, and time.  CN grossly intact, station and gait intact  Skin: Skin is warm and dry. No rash noted.  Psychiatric: She has a normal mood and affect. Her behavior is normal. Judgment and thought content normal.  Nursing note and vitals reviewed.  Results  for orders placed or performed in visit on 04/13/17  Lipid Panel  Result Value Ref Range   Cholesterol 224 (H) 0 - 200 mg/dL   Triglycerides 69.0 0.0 - 149.0 mg/dL   HDL 83.60 >39.00 mg/dL   VLDL 13.8 0.0 - 40.0 mg/dL   LDL Cholesterol 127 (H) 0 - 99 mg/dL   Total CHOL/HDL Ratio 3    NonHDL 140.82   Vitamin D, 25-hydroxy  Result Value Ref Range   VITD 29.68 (L) 30.00 - 100.00 ng/mL  Vitamin B12  Result Value Ref Range   Vitamin B-12 540 211 - 911 pg/mL  TSH  Result Value Ref Range   TSH 2.41 0.35 - 4.50 uIU/mL  Basic metabolic panel  Result Value Ref Range   Sodium 142 135 - 145 mEq/L   Potassium 5.4 (H) 3.5 - 5.1 mEq/L   Chloride 104 96 - 112 mEq/L   CO2 33 (H) 19 - 32 mEq/L   Glucose, Bld 96 70 - 99 mg/dL   BUN 16 6 - 23 mg/dL   Creatinine, Ser 0.77 0.40 - 1.20 mg/dL   Calcium 10.4 8.4 - 10.5 mg/dL   GFR 78.02 >60.00 mL/min      Assessment & Plan:   Problem List Items Addressed This Visit    Advanced care planning/counseling discussion    Advanced directives: Has info at home. Has not filled out. Would want sons to be HCPOA.      RESOLVED: Bilateral carotid bruits    None appreciated today.       HLD (hyperlipidemia)    Driven by HDL. No meds indicated at this time.       Osteoporosis - Primary    Stable on fosamax, has been on medication for last 8-9 yrs. Discussed change in therapy - prolia handout provided.       RESOLVED: Rectal bleeding   Vitamin B12 deficiency    Stable on oral replacement. Continue.      Vitamin D deficiency    Improving with replacement - continue 2000 IU daily       Other Visit Diagnoses    Hyperkalemia       Relevant Orders   Potassium       Follow up plan: Return in about 6 months (around 10/21/2017) for follow up visit.  Kimberly Bush, MD

## 2017-04-20 NOTE — Assessment & Plan Note (Signed)
Advanced directives: Has info at home. Has not filled out. Would want sons to be HCPOA.  

## 2017-04-20 NOTE — Assessment & Plan Note (Signed)
Driven by HDL. No meds indicated at this time.

## 2017-04-21 ENCOUNTER — Encounter: Payer: Self-pay | Admitting: *Deleted

## 2017-08-04 ENCOUNTER — Telehealth: Payer: Self-pay | Admitting: Family Medicine

## 2017-08-04 NOTE — Telephone Encounter (Signed)
Patient Name: Kimberly Davies  DOB: 1941-02-12    Initial Comment Caller states her jaw will open so far, and she went on line and thinks she may have lock jaw. She has been under some stress.   Nurse Assessment  Nurse: Raphael Gibney, RN, Vanita Ingles Date/Time (Eastern Time): 08/04/2017 9:40:21 AM  Confirm and document reason for call. If symptomatic, describe symptoms. ---Caller states that her jaw will only open so far. She has been chewing on the other side. Has pain when she opens her mouth wide. No fever. No swelling. has had pain about 2 weeks. Has recently moved to a new house.  Does the patient have any new or worsening symptoms? ---Yes  Will a triage be completed? ---Yes  Related visit to physician within the last 2 weeks? ---No  Does the PT have any chronic conditions? (i.e. diabetes, asthma, etc.) ---No  Is this a behavioral health or substance abuse call? ---No     Guidelines    Guideline Title Affirmed Question Affirmed Notes  Face Pain Face pain present > 24 hours    Final Disposition User   See Physician within 24 Hours [place], RN, Vera    Comments  appt scheduled for 8: 15 am 08/05/2017 with Dr. Ria Bush   Referrals  REFERRED TO PCP OFFICE   Disagree/Comply: Comply

## 2017-08-04 NOTE — Telephone Encounter (Signed)
Pt has appt with Dr Darnell Level on 08/05/17 at 8:15.

## 2017-08-05 ENCOUNTER — Ambulatory Visit (INDEPENDENT_AMBULATORY_CARE_PROVIDER_SITE_OTHER): Payer: Medicare Other | Admitting: Family Medicine

## 2017-08-05 ENCOUNTER — Encounter: Payer: Self-pay | Admitting: Family Medicine

## 2017-08-05 VITALS — BP 104/62 | HR 79 | Temp 97.6°F | Wt 105.2 lb

## 2017-08-05 DIAGNOSIS — M810 Age-related osteoporosis without current pathological fracture: Secondary | ICD-10-CM

## 2017-08-05 DIAGNOSIS — R6884 Jaw pain: Secondary | ICD-10-CM | POA: Insufficient documentation

## 2017-08-05 LAB — CBC WITH DIFFERENTIAL/PLATELET
BASOS PCT: 0.8 % (ref 0.0–3.0)
Basophils Absolute: 0 10*3/uL (ref 0.0–0.1)
EOS ABS: 0 10*3/uL (ref 0.0–0.7)
EOS PCT: 0.4 % (ref 0.0–5.0)
HEMATOCRIT: 38.7 % (ref 36.0–46.0)
Hemoglobin: 12.5 g/dL (ref 12.0–15.0)
LYMPHS PCT: 28.2 % (ref 12.0–46.0)
Lymphs Abs: 1.7 10*3/uL (ref 0.7–4.0)
MCHC: 32.3 g/dL (ref 30.0–36.0)
MCV: 89.5 fl (ref 78.0–100.0)
Monocytes Absolute: 0.6 10*3/uL (ref 0.1–1.0)
Monocytes Relative: 9.7 % (ref 3.0–12.0)
NEUTROS ABS: 3.7 10*3/uL (ref 1.4–7.7)
Neutrophils Relative %: 60.9 % (ref 43.0–77.0)
PLATELETS: 310 10*3/uL (ref 150.0–400.0)
RBC: 4.33 Mil/uL (ref 3.87–5.11)
RDW: 13.9 % (ref 11.5–15.5)
WBC: 6 10*3/uL (ref 4.0–10.5)

## 2017-08-05 LAB — COMPREHENSIVE METABOLIC PANEL
ALT: 10 U/L (ref 0–35)
AST: 19 U/L (ref 0–37)
Albumin: 4.3 g/dL (ref 3.5–5.2)
Alkaline Phosphatase: 51 U/L (ref 39–117)
BUN: 12 mg/dL (ref 6–23)
CALCIUM: 10.3 mg/dL (ref 8.4–10.5)
CHLORIDE: 102 meq/L (ref 96–112)
CO2: 31 meq/L (ref 19–32)
CREATININE: 0.76 mg/dL (ref 0.40–1.20)
GFR: 79.14 mL/min (ref >60.00)
Glucose, Bld: 94 mg/dL (ref 70–99)
POTASSIUM: 3.8 meq/L (ref 3.5–5.1)
SODIUM: 140 meq/L (ref 135–145)
Total Bilirubin: 1 mg/dL (ref 0.2–1.2)
Total Protein: 7.3 g/dL (ref 6.0–8.3)

## 2017-08-05 LAB — SEDIMENTATION RATE: Sed Rate: 12 mm/hr (ref 0–30)

## 2017-08-05 NOTE — Assessment & Plan Note (Signed)
Nonspecific R lower jaw pain that is not reproducible to palpation. She does have significant dental disease around 1st lower premolar on right - possible culprit. She has f/u with dentist next week.  Check labs today (CBC, ESR, CMP).  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

## 2017-08-05 NOTE — Patient Instructions (Addendum)
You do have some dental disease of right lower teeth (1st premolar) which could be contributing. Less likely TMJ dysfunction.  Let's stop fosamax for now, but I don't think this is bone breakdown from that medicine. Regardless will see what dentist thinks. Let me know if he wants CT scan to check.  Labs today.  May use tylenol for discomfort as needed.  Continue calcium and vitamin D.

## 2017-08-05 NOTE — Progress Notes (Signed)
BP 104/62   Pulse 79   Temp 97.6 F (36.4 C) (Oral)   Wt 105 lb 4 oz (47.7 kg)   SpO2 98%   BMI 19.25 kg/m    CC: jaw pain Subjective:    Patient ID: Kimberly Davies, female    DOB: 1941/06/17, 76 y.o.   MRN: [id number]  HPI: Kimberly Davies is a 76 y.o. female presenting on 08/05/2017 for Jaw Pain (x2wks)   Several wk h/o R lower jaw pain mainly noticed when opening the mouth. No swelling, no fevers/chills, ear or tooth pain. No pain at left side. No pain at R TMJ. She has been using left side to chew.   Upcoming appt with dentist next week (6 mo checkup).   Long history of osteoporosis - took actonel for 5 yrs starting 2008, then restarted fosamax 2016.   Relevant past medical, surgical, family and social history reviewed and updated as indicated. Interim medical history since our last visit reviewed. Allergies and medications reviewed and updated. Outpatient Medications Prior to Visit  Medication Sig Dispense Refill  . Cholecalciferol (VITAMIN D) 2000 units CAPS Take 1 capsule (2,000 Units total) by mouth daily. 30 capsule   . cyanocobalamin (V-R VITAMIN B-12) 500 MCG tablet Take 1 tablet (500 mcg total) by mouth daily.    Marland Kitchen alendronate (FOSAMAX) 70 MG tablet TAKE 1 TABLET BY MOUTH EVERY 7 DAYS W/ FULL GLASS OF WATER ON AN EMPTY STOMACH 4 tablet 11   No facility-administered medications prior to visit.      Per HPI unless specifically indicated in ROS section below Review of Systems     Objective:    BP 104/62   Pulse 79   Temp 97.6 F (36.4 C) (Oral)   Wt 105 lb 4 oz (47.7 kg)   SpO2 98%   BMI 19.25 kg/m   Wt Readings from Last 3 Encounters:  08/05/17 105 lb 4 oz (47.7 kg)  04/20/17 108 lb (49 kg)  04/13/17 107 lb 8 oz (48.8 kg)    Physical Exam  Constitutional: She appears well-developed and well-nourished. No distress.  HENT:  Head: Normocephalic and atraumatic.  Right Ear: Hearing, tympanic membrane, external ear and ear canal normal.  Mouth/Throat:  Uvula is midline, oropharynx is clear and moist and mucous membranes are normal. Abnormal dentition. No oropharyngeal exudate, posterior oropharyngeal edema, posterior oropharyngeal erythema or tonsillar abscesses.  Poor dentition especially around R lower 1st premolar No significant asymmetry in TMJ movement  No significant trismus No significant reproducible pain to palpation at jaw.   Neck: Normal range of motion. Neck supple.  Lymphadenopathy:    She has no cervical adenopathy.  Nursing note and vitals reviewed.  Results for orders placed or performed in visit on 04/20/17  Potassium  Result Value Ref Range   Potassium 4.1 3.5 - 5.1 mEq/L      Assessment & Plan:   Problem List Items Addressed This Visit    Jaw pain - Primary    Nonspecific R lower jaw pain that is not reproducible to palpation. She does have significant dental disease around 1st lower premolar on right - possible culprit. She has f/u with dentist next week.  Check labs today (CBC, ESR, CMP).  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       Relevant Orders   Comprehensive metabolic panel   CBC with Differential/Platelet   Sedimentation rate   Osteoporosis    Long history of osteoporosis -  took actonel for 5 yrs starting 2008, then restarted fosamax 2016.  Given possible concern for osteonecrosis, will stop fosamax.  Continue vitamin D.           Follow up plan: Return if symptoms worsen or fail to improve.  Ria Bush, MD

## 2017-08-05 NOTE — Assessment & Plan Note (Signed)
Long history of osteoporosis - took actonel for 5 yrs starting 2008, then restarted fosamax 2016.  Given possible concern for osteonecrosis, will stop fosamax.  Continue vitamin D.

## 2017-08-09 ENCOUNTER — Encounter: Payer: Self-pay | Admitting: *Deleted

## 2017-08-25 ENCOUNTER — Other Ambulatory Visit: Payer: Self-pay | Admitting: Dentistry

## 2017-08-25 DIAGNOSIS — S0300XA Dislocation of jaw, unspecified side, initial encounter: Secondary | ICD-10-CM

## 2017-08-29 ENCOUNTER — Ambulatory Visit
Admission: RE | Admit: 2017-08-29 | Discharge: 2017-08-29 | Disposition: A | Discharge status: Home / Self Care | Payer: Medicare Other | Source: Ambulatory Visit | Attending: Dentistry | Admitting: Dentistry

## 2017-08-29 DIAGNOSIS — S0300XA Dislocation of jaw, unspecified side, initial encounter: Secondary | ICD-10-CM

## 2017-09-01 DIAGNOSIS — M26601 Right temporomandibular joint disorder, unspecified: Secondary | ICD-10-CM | POA: Diagnosis not present

## 2017-11-23 ENCOUNTER — Other Ambulatory Visit: Payer: Self-pay | Admitting: Family Medicine

## 2017-11-23 DIAGNOSIS — Z1231 Encounter for screening mammogram for malignant neoplasm of breast: Secondary | ICD-10-CM

## 2017-12-22 ENCOUNTER — Ambulatory Visit
Admission: RE | Admit: 2017-12-22 | Discharge: 2017-12-22 | Disposition: A | Discharge status: Home / Self Care | Payer: Medicare Other | Source: Ambulatory Visit | Attending: Family Medicine | Admitting: Family Medicine

## 2017-12-22 DIAGNOSIS — Z1231 Encounter for screening mammogram for malignant neoplasm of breast: Secondary | ICD-10-CM | POA: Diagnosis not present

## 2017-12-22 LAB — HM MAMMOGRAPHY

## 2017-12-23 ENCOUNTER — Encounter: Payer: Self-pay | Admitting: Family Medicine

## 2018-04-13 ENCOUNTER — Ambulatory Visit: Payer: Medicare Other | Admitting: Family Medicine

## 2018-04-13 ENCOUNTER — Other Ambulatory Visit: Payer: Self-pay | Admitting: Family Medicine

## 2018-04-13 DIAGNOSIS — D539 Nutritional anemia, unspecified: Secondary | ICD-10-CM

## 2018-04-13 DIAGNOSIS — E538 Deficiency of other specified B group vitamins: Secondary | ICD-10-CM

## 2018-04-13 DIAGNOSIS — E559 Vitamin D deficiency, unspecified: Secondary | ICD-10-CM

## 2018-04-13 DIAGNOSIS — R7989 Other specified abnormal findings of blood chemistry: Secondary | ICD-10-CM

## 2018-04-13 DIAGNOSIS — E7849 Other hyperlipidemia: Secondary | ICD-10-CM

## 2018-04-17 ENCOUNTER — Ambulatory Visit (INDEPENDENT_AMBULATORY_CARE_PROVIDER_SITE_OTHER): Payer: Medicare Other

## 2018-04-17 VITALS — BP 110/70 | HR 89 | Temp 97.6°F | Ht 62.0 in | Wt 106.5 lb

## 2018-04-17 DIAGNOSIS — Z0000 Encounter for general adult medical examination without abnormal findings: Secondary | ICD-10-CM | POA: Diagnosis not present

## 2018-04-17 DIAGNOSIS — D539 Nutritional anemia, unspecified: Secondary | ICD-10-CM | POA: Diagnosis not present

## 2018-04-17 DIAGNOSIS — E538 Deficiency of other specified B group vitamins: Secondary | ICD-10-CM | POA: Diagnosis not present

## 2018-04-17 DIAGNOSIS — E7849 Other hyperlipidemia: Secondary | ICD-10-CM

## 2018-04-17 DIAGNOSIS — E559 Vitamin D deficiency, unspecified: Secondary | ICD-10-CM | POA: Diagnosis not present

## 2018-04-17 LAB — BASIC METABOLIC PANEL
BUN: 11 mg/dL (ref 6–23)
CHLORIDE: 103 meq/L (ref 96–112)
CO2: 32 mEq/L (ref 19–32)
CREATININE: 0.71 mg/dL (ref 0.40–1.20)
Calcium: 10.1 mg/dL (ref 8.4–10.5)
GFR: 85.44 mL/min (ref >60.00)
Glucose, Bld: 95 mg/dL (ref 70–99)
POTASSIUM: 4.1 meq/L (ref 3.5–5.1)
SODIUM: 142 meq/L (ref 135–145)

## 2018-04-17 LAB — CBC WITH DIFFERENTIAL/PLATELET
BASOS ABS: 0 10*3/uL (ref 0.0–0.1)
Basophils Relative: 0.9 % (ref 0.0–3.0)
EOS ABS: 0 10*3/uL (ref 0.0–0.7)
Eosinophils Relative: 0.3 % (ref 0.0–5.0)
HCT: 39.1 % (ref 36.0–46.0)
Hemoglobin: 13 g/dL (ref 12.0–15.0)
Lymphocytes Relative: 28.8 % (ref 12.0–46.0)
Lymphs Abs: 1.6 10*3/uL (ref 0.7–4.0)
MCHC: 33.3 g/dL (ref 30.0–36.0)
MCV: 87.6 fl (ref 78.0–100.0)
MONOS PCT: 9.3 % (ref 3.0–12.0)
Monocytes Absolute: 0.5 10*3/uL (ref 0.1–1.0)
NEUTROS ABS: 3.4 10*3/uL (ref 1.4–7.7)
Neutrophils Relative %: 60.7 % (ref 43.0–77.0)
PLATELETS: 331 10*3/uL (ref 150.0–400.0)
RBC: 4.47 Mil/uL (ref 3.87–5.11)
RDW: 13.9 % (ref 11.5–15.5)
WBC: 5.6 10*3/uL (ref 4.0–10.5)

## 2018-04-17 LAB — VITAMIN B12: VITAMIN B 12: 325 pg/mL (ref 211–911)

## 2018-04-17 LAB — LIPID PANEL
CHOLESTEROL: 217 mg/dL — AB (ref 0–200)
HDL: 78.6 mg/dL (ref >39.00)
LDL CALC: 128 mg/dL — AB (ref 0–99)
NonHDL: 138.77
TRIGLYCERIDES: 52 mg/dL (ref 0.0–149.0)
Total CHOL/HDL Ratio: 3
VLDL: 10.4 mg/dL (ref 0.0–40.0)

## 2018-04-17 LAB — VITAMIN D 25 HYDROXY (VIT D DEFICIENCY, FRACTURES): VITD: 16.28 ng/mL — AB (ref 30.00–100.00)

## 2018-04-17 NOTE — Patient Instructions (Signed)
Kimberly Davies , Thank you for taking time to come for your Medicare Wellness Visit. I appreciate your ongoing commitment to your health goals. Please review the following plan we discussed and let me know if I can assist you in the future.   These are the goals we discussed: Goals    . Increase physical activity     Starting 04/17/2018, I will continue walking 30 min daily.        This is a list of the screening recommended for you and due dates:  Health Maintenance  Topic Date Due  . DTaP/Tdap/Td vaccine (1 - Tdap) 05/13/2018*  . Tetanus Vaccine  05/13/2018  . Flu Shot  07/06/2018  . Mammogram  12/22/2018  . Colon Cancer Screening  08/08/2023  . DEXA scan (bone density measurement)  Completed  . Pneumonia vaccines  Completed  *Topic was postponed. The date shown is not the original due date.   Preventive Care for Adults  A healthy lifestyle and preventive care can promote health and wellness. Preventive health guidelines for adults include the following key practices.  . A routine yearly physical is a good way to check with your health care provider about your health and preventive screening. It is a chance to share any concerns and updates on your health and to receive a thorough exam.  . Visit your dentist for a routine exam and preventive care every 6 months. Brush your teeth twice a day and floss once a day. Good oral hygiene prevents tooth decay and gum disease.  . The frequency of eye exams is based on your age, health, family medical history, use  of contact lenses, and other factors. Follow your health care provider's recommendations for frequency of eye exams.  . Eat a healthy diet. Foods like vegetables, fruits, whole grains, low-fat dairy products, and lean protein foods contain the nutrients you need without too many calories. Decrease your intake of foods high in solid fats, added sugars, and salt. Eat the right amount of calories for you. Get information about a proper  diet from your health care provider, if necessary.  . Regular physical exercise is one of the most important things you can do for your health. Most adults should get at least 150 minutes of moderate-intensity exercise (any activity that increases your heart rate and causes you to sweat) each week. In addition, most adults need muscle-strengthening exercises on 2 or more days a week.  Silver Sneakers may be a benefit available to you. To determine eligibility, you may visit the website: www.silversneakers.com or contact program at 7623233843 Mon-Fri between 8AM-8PM.   . Maintain a healthy weight. The body mass index (BMI) is a screening tool to identify possible weight problems. It provides an estimate of body fat based on height and weight. Your health care provider can find your BMI and can help you achieve or maintain a healthy weight.   For adults 20 years and older: ? A BMI below 18.5 is considered underweight. ? A BMI of 18.5 to 24.9 is normal. ? A BMI of 25 to 29.9 is considered overweight. ? A BMI of 30 and above is considered obese.   . Maintain normal blood lipids and cholesterol levels by exercising and minimizing your intake of saturated fat. Eat a balanced diet with plenty of fruit and vegetables. Blood tests for lipids and cholesterol should begin at age 73 and be repeated every 5 years. If your lipid or cholesterol levels are high, you are over 50,  or you are at high risk for heart disease, you may need your cholesterol levels checked more frequently. Ongoing high lipid and cholesterol levels should be treated with medicines if diet and exercise are not working.  . If you smoke, find out from your health care provider how to quit. If you do not use tobacco, please do not start.  . If you choose to drink alcohol, please do not consume more than 2 drinks per day. One drink is considered to be 12 ounces (355 mL) of beer, 5 ounces (148 mL) of wine, or 1.5 ounces (44 mL) of  liquor.  . If you are 5-61 years old, ask your health care provider if you should take aspirin to prevent strokes.  . Use sunscreen. Apply sunscreen liberally and repeatedly throughout the day. You should seek shade when your shadow is shorter than you. Protect yourself by wearing long sleeves, pants, a wide-brimmed hat, and sunglasses year round, whenever you are outdoors.  . Once a month, do a whole body skin exam, using a mirror to look at the skin on your back. Tell your health care provider of new moles, moles that have irregular borders, moles that are larger than a pencil eraser, or moles that have changed in shape or color.
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
## 2018-04-17 NOTE — Progress Notes (Signed)
PCP notes:   Health maintenance:  No gaps identified.   Abnormal screenings:   Mini-Cog score: 18/20 MMSE - Mini Mental State Exam 04/17/2018 04/13/2017 03/22/2016  Orientation to time 5 5 5   Orientation to Place 5 5 5   Registration 3 3 3   Attention/ Calculation 0 0 0  Recall 1 1 3   Recall-comments unable to recall 2 of 3 words pt was unable to recall 2 of 3 words -  Language- name 2 objects 0 0 0  Language- repeat 1 1 1   Language- follow 3 step command 3 3 3   Language- read & follow direction 0 0 0  Write a sentence 0 0 0  Copy design 0 0 0  Total score 18 18 20     Patient concerns:   None  Nurse concerns:  None  Next PCP appt:   04/18/18 @ 0900  I reviewed health advisor's note, was available for consultation on the day of service listed in this note, and agree with documentation and plan. Elsie Stain, MD.

## 2018-04-18 ENCOUNTER — Encounter: Payer: Self-pay | Admitting: Family Medicine

## 2018-04-18 ENCOUNTER — Ambulatory Visit (INDEPENDENT_AMBULATORY_CARE_PROVIDER_SITE_OTHER): Payer: Medicare Other | Admitting: Family Medicine

## 2018-04-18 ENCOUNTER — Telehealth: Payer: Self-pay | Admitting: Family Medicine

## 2018-04-18 VITALS — BP 120/76 | HR 85 | Temp 97.9°F | Ht 62.0 in | Wt 106.5 lb

## 2018-04-18 DIAGNOSIS — E538 Deficiency of other specified B group vitamins: Secondary | ICD-10-CM | POA: Diagnosis not present

## 2018-04-18 DIAGNOSIS — M810 Age-related osteoporosis without current pathological fracture: Secondary | ICD-10-CM | POA: Diagnosis not present

## 2018-04-18 DIAGNOSIS — G3184 Mild cognitive impairment, so stated: Secondary | ICD-10-CM | POA: Insufficient documentation

## 2018-04-18 DIAGNOSIS — F0280 Dementia in other diseases classified elsewhere without behavioral disturbance: Secondary | ICD-10-CM | POA: Insufficient documentation

## 2018-04-18 DIAGNOSIS — Z7189 Other specified counseling: Secondary | ICD-10-CM | POA: Diagnosis not present

## 2018-04-18 DIAGNOSIS — E7849 Other hyperlipidemia: Secondary | ICD-10-CM | POA: Diagnosis not present

## 2018-04-18 DIAGNOSIS — R413 Other amnesia: Secondary | ICD-10-CM | POA: Diagnosis not present

## 2018-04-18 DIAGNOSIS — E559 Vitamin D deficiency, unspecified: Secondary | ICD-10-CM

## 2018-04-18 MED ORDER — VITAMIN D 50 MCG (2000 UT) PO CAPS: 1.0000 | ORAL_CAPSULE | Freq: Every day | ORAL | Status: DC

## 2018-04-18 MED ORDER — CALCIUM-VITAMIN D 600-400 MG-UNIT PO TABS: 1.0000 | ORAL_TABLET | Freq: Every day | ORAL | Status: DC

## 2018-04-18 NOTE — Telephone Encounter (Signed)
Information has been submitted to pts insurance for verification of benefits. Awaiting response for coverage  

## 2018-04-18 NOTE — Telephone Encounter (Signed)
Can you check into prolia cost for patient?  Thank you

## 2018-04-18 NOTE — Patient Instructions (Addendum)
Restart calcium and vitamin D 1 supplement daily + vitamin D 2000 units daily. Consider multivitamin daily We will look into prolia cost and be in touch.  If interested, check with pharmacy about new 2 shot shingles series (shingrix).  Work on setting up advanced directive.  Continue reading, social engagement, physical exercise to keep memory sharp.  Schedule eye exam at your convenience.   Health Maintenance, Female Adopting a healthy lifestyle and getting preventive care can go a long way to promote health and wellness. Talk with your health care provider about what schedule of regular examinations is right for you. This is a good chance for you to check in with your provider about disease prevention and staying healthy. In between checkups, there are plenty of things you can do on your own. Experts have done a lot of research about which lifestyle changes and preventive measures are most likely to keep you healthy. Ask your health care provider for more information. Weight and diet Eat a healthy diet  Be sure to include plenty of vegetables, fruits, low-fat dairy products, and lean protein.  Do not eat a lot of foods high in solid fats, added sugars, or salt.  Get regular exercise. This is one of the most important things you can do for your health. ? Most adults should exercise for at least 150 minutes each week. The exercise should increase your heart rate and make you sweat (moderate-intensity exercise). ? Most adults should also do strengthening exercises at least twice a week. This is in addition to the moderate-intensity exercise.  Maintain a healthy weight  Body mass index (BMI) is a measurement that can be used to identify possible weight problems. It estimates body fat based on height and weight. Your health care provider can help determine your BMI and help you achieve or maintain a healthy weight.  For females 75 years of age and older: ? A BMI below 18.5 is considered  underweight. ? A BMI of 18.5 to 24.9 is normal. ? A BMI of 25 to 29.9 is considered overweight. ? A BMI of 30 and above is considered obese.  Watch levels of cholesterol and blood lipids  You should start having your blood tested for lipids and cholesterol at 75 years of age, then have this test every 5 years.  You may need to have your cholesterol levels checked more often if: ? Your lipid or cholesterol levels are high. ? You are older than 75 years of age. ? You are at high risk for heart disease.  Cancer screening Lung Cancer  Lung cancer screening is recommended for adults 75-75 years old who are at high risk for lung cancer because of a history of smoking.  A yearly low-dose CT scan of the lungs is recommended for people who: ? Currently smoke. ? Have quit within the past 15 years. ? Have at least a 30-pack-year history of smoking. A pack year is smoking an average of one pack of cigarettes a day for 1 year.  Yearly screening should continue until it has been 15 years since you quit.  Yearly screening should stop if you develop a health problem that would prevent you from having lung cancer treatment.  Breast Cancer  Practice breast self-awareness. This means understanding how your breasts normally appear and feel.  It also means doing regular breast self-exams. Let your health care provider know about any changes, no matter how small.  If you are in your 75s or 75s, you should have  a clinical breast exam (CBE) by a health care provider every 1-3 years as part of a regular health exam.  If you are 75 or older, have a CBE every year. Also consider having a breast X-ray (mammogram) every year.  If you have a family history of breast cancer, talk to your health care provider about genetic screening.  If you are at high risk for breast cancer, talk to your health care provider about having an MRI and a mammogram every year.  Breast cancer gene (BRCA) assessment is  recommended for women who have family members with BRCA-related cancers. BRCA-related cancers include: ? Breast. ? Ovarian. ? Tubal. ? Peritoneal cancers.  Results of the assessment will determine the need for genetic counseling and BRCA1 and BRCA2 testing.  Cervical Cancer Your health care provider may recommend that you be screened regularly for cancer of the pelvic organs (ovaries, uterus, and vagina). This screening involves a pelvic examination, including checking for microscopic changes to the surface of your cervix (Pap test). You may be encouraged to have this screening done every 3 years, beginning at age 75.  For women ages 75-75, health care providers may recommend pelvic exams and Pap testing every 3 years, or they may recommend the Pap and pelvic exam, combined with testing for human papilloma virus (HPV), every 5 years. Some types of HPV increase your risk of cervical cancer. Testing for HPV may also be done on women of any age with unclear Pap test results.  Other health care providers may not recommend any screening for nonpregnant women who are considered low risk for pelvic cancer and who do not have symptoms. Ask your health care provider if a screening pelvic exam is right for you.  If you have had past treatment for cervical cancer or a condition that could lead to cancer, you need Pap tests and screening for cancer for at least 20 years after your treatment. If Pap tests have been discontinued, your risk factors (such as having a new sexual partner) need to be reassessed to determine if screening should resume. Some women have medical problems that increase the chance of getting cervical cancer. In these cases, your health care provider may recommend more frequent screening and Pap tests.  Colorectal Cancer  This type of cancer can be detected and often prevented.  Routine colorectal cancer screening usually begins at 75 years of age and continues through 75 years of  age.  Your health care provider may recommend screening at an earlier age if you have risk factors for colon cancer.  Your health care provider may also recommend using home test kits to check for hidden blood in the stool.  A small camera at the end of a tube can be used to examine your colon directly (sigmoidoscopy or colonoscopy). This is done to check for the earliest forms of colorectal cancer.  Routine screening usually begins at age 55.  Direct examination of the colon should be repeated every 5-10 years through 75 years of age. However, you may need to be screened more often if early forms of precancerous polyps or small growths are found.  Skin Cancer  Check your skin from head to toe regularly.  Tell your health care provider about any new moles or changes in moles, especially if there is a change in a mole's shape or color.  Also tell your health care provider if you have a mole that is larger than the size of a pencil eraser.  Always use  sunscreen. Apply sunscreen liberally and repeatedly throughout the day.  Protect yourself by wearing long sleeves, pants, a wide-brimmed hat, and sunglasses whenever you are outside.  Heart disease, diabetes, and high blood pressure  High blood pressure causes heart disease and increases the risk of stroke. High blood pressure is more likely to develop in: ? People who have blood pressure in the high end of the normal range (130-139/85-89 mm Hg). ? People who are overweight or obese. ? People who are African American.  If you are 53-71 years of age, have your blood pressure checked every 3-5 years. If you are 72 years of age or older, have your blood pressure checked every year. You should have your blood pressure measured twice-once when you are at a hospital or clinic, and once when you are not at a hospital or clinic. Record the average of the two measurements. To check your blood pressure when you are not at a hospital or clinic, you  can use: ? An automated blood pressure machine at a pharmacy. ? A home blood pressure monitor.  If you are between 35 years and 78 years old, ask your health care provider if you should take aspirin to prevent strokes.  Have regular diabetes screenings. This involves taking a blood sample to check your fasting blood sugar level. ? If you are at a normal weight and have a low risk for diabetes, have this test once every three years after 75 years of age. ? If you are overweight and have a high risk for diabetes, consider being tested at a younger age or more often. Preventing infection Hepatitis B  If you have a higher risk for hepatitis B, you should be screened for this virus. You are considered at high risk for hepatitis B if: ? You were born in a country where hepatitis B is common. Ask your health care provider which countries are considered high risk. ? Your parents were born in a high-risk country, and you have not been immunized against hepatitis B (hepatitis B vaccine). ? You have HIV or AIDS. ? You use needles to inject street drugs. ? You live with someone who has hepatitis B. ? You have had sex with someone who has hepatitis B. ? You get hemodialysis treatment. ? You take certain medicines for conditions, including cancer, organ transplantation, and autoimmune conditions.  Hepatitis C  Blood testing is recommended for: ? Everyone born from [birth year] through 1963. ? Anyone with known risk factors for hepatitis C.  Sexually transmitted infections (STIs)  You should be screened for sexually transmitted infections (STIs) including gonorrhea and chlamydia if: ? You are sexually active and are younger than 75 years of age. ? You are older than 75 years of age and your health care provider tells you that you are at risk for this type of infection. ? Your sexual activity has changed since you were last screened and you are at an increased risk for chlamydia or gonorrhea. Ask your  health care provider if you are at risk.  If you do not have HIV, but are at risk, it may be recommended that you take a prescription medicine daily to prevent HIV infection. This is called pre-exposure prophylaxis (PrEP). You are considered at risk if: ? You are sexually active and do not regularly use condoms or know the HIV status of your partner(s). ? You take drugs by injection. ? You are sexually active with a partner who has HIV.  Talk with your health  care provider about whether you are at high risk of being infected with HIV. If you choose to begin PrEP, you should first be tested for HIV. You should then be tested every 3 months for as long as you are taking PrEP. Pregnancy  If you are premenopausal and you may become pregnant, ask your health care provider about preconception counseling.  If you may become pregnant, take 400 to 800 micrograms (mcg) of folic acid every day.  If you want to prevent pregnancy, talk to your health care provider about birth control (contraception). Osteoporosis and menopause  Osteoporosis is a disease in which the bones lose minerals and strength with aging. This can result in serious bone fractures. Your risk for osteoporosis can be identified using a bone density scan.  If you are 81 years of age or older, or if you are at risk for osteoporosis and fractures, ask your health care provider if you should be screened.  Ask your health care provider whether you should take a calcium or vitamin D supplement to lower your risk for osteoporosis.  Menopause may have certain physical symptoms and risks.  Hormone replacement therapy may reduce some of these symptoms and risks. Talk to your health care provider about whether hormone replacement therapy is right for you. Follow these instructions at home:  Schedule regular health, dental, and eye exams.  Stay current with your immunizations.  Do not use any tobacco products including cigarettes, chewing  tobacco, or electronic cigarettes.  If you are pregnant, do not drink alcohol.  If you are breastfeeding, limit how much and how often you drink alcohol.  Limit alcohol intake to no more than 1 drink per day for nonpregnant women. One drink equals 12 ounces of beer, 5 ounces of wine, or 1 ounces of hard liquor.  Do not use street drugs.  Do not share needles.  Ask your health care provider for help if you need support or information about quitting drugs.  Tell your health care provider if you often feel depressed.  Tell your health care provider if you have ever been abused or do not feel safe at home. This information is not intended to replace advice given to you by your health care provider. Make sure you discuss any questions you have with your health care provider. Document Released: 06/07/2011 Document Revised: 04/29/2016 Document Reviewed: 08/26/2015 Elsevier Interactive Patient Education  [employer].

## 2018-04-18 NOTE — Assessment & Plan Note (Addendum)
Completed ~7-8 yrs bisphosphonate. Recent dental work. We will check on prolia cost for patient.  She was not taking vitamin D or calcium - rec restart both.  Continue regular weight bearing exercise.

## 2018-04-18 NOTE — Assessment & Plan Note (Signed)
Levels dropping off replacement - advised start MVI with B12 or B complex vitamin.

## 2018-04-18 NOTE — Progress Notes (Signed)
BP 120/76 (BP Location: Left Arm, Patient Position: Sitting, Cuff Size: Normal)   Pulse 85   Temp 97.9 F (36.6 C) (Oral)   Ht 5\' 2"  (1.575 m)   Wt 106 lb 8 oz (48.3 kg)   SpO2 98%   BMI 19.48 kg/m    CC: AMW f/u Subjective:    Patient ID: [name], female    DOB: May 25, 1941, 77 y.o.   MRN: [id number]  HPI: JOHNDA BILLIOT is a 77 y.o. female presenting on 04/18/2018 for Annual Exam (Pt 2.)   Saw Katha Cabal yesterday for medicare wellness visit. Note reviewed.  Missed some memory questions yesterday (1/3 recall). Notes some short term memory troubles. Husband has noticed this. Mother with alzheimer's dementia at later age.   Seen here 07/2017 with R jaw pain - fosamax stopped until eval with dentist. Has been on fosamax total ~8 yrs. Had dental work, jaw pain resolved.  -DEXA 02/2014 T score spine -3.1 and -2.8 at hip.  -DEXA 11/2016 T score spine -2.7 and hip -2.9.   Preventative: COLONOSCOPY Date: 08/2013 small diverticula o/w normal, rpt 10 yrs Ardis Hughs)  Well woman exam - no fmhx cervical cancer.  Mammo - 12/2017 birads 1 Dexa Date: 11/2016 T score spine -2.7 and hip -2.9. Flu shot yearly  Pneumovax 2011, prevnar 2015 Tetanus 2009. Zostavax 2012 Shingrix discussed Advanced directives: Has info at home. Has not filled out. Would want sons to be HCPOA.  Seat belt use discussed Sunscreen use discussed. No changing moles on skin.  Non smoker  Alcohol - none  Voiding well - no urinary incontinence. Wears pads.  Normal bowel movements daily.  Due for eye exam Dentist - yearly  Caffeine: none Lives with husband, 2 grown children. 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 Interim medical history since our last visit reviewed. Allergies and medications reviewed and updated. No outpatient  medications prior to visit.   No facility-administered medications prior to visit.      Per HPI unless specifically indicated in ROS section below Review of Systems     Objective:    BP 120/76 (BP Location: Left Arm, Patient Position: Sitting, Cuff Size: Normal)   Pulse 85   Temp 97.9 F (36.6 C) (Oral)   Ht 5\' 2"  (1.575 m)   Wt 106 lb 8 oz (48.3 kg)   SpO2 98%   BMI 19.48 kg/m   Wt Readings from Last 3 Encounters:  04/18/18 106 lb 8 oz (48.3 kg)  04/17/18 106 lb 8 oz (48.3 kg)  08/05/17 105 lb 4 oz (47.7 kg)    Physical Exam Results for orders placed or performed in visit on 04/17/18  CBC with Differential/Platelet  Result Value Ref Range   WBC 5.6 4.0 - 10.5 K/uL   RBC 4.47 3.87 - 5.11 Mil/uL   Hemoglobin 13.0 12.0 - 15.0 g/dL   HCT 39.1 36.0 - 46.0 %   MCV 87.6 78.0 - 100.0 fl   MCHC 33.3 30.0 - 36.0 g/dL   RDW 13.9 11.5 - 15.5 %   Platelets 331.0 150.0 - 400.0 K/uL   Neutrophils Relative % 60.7 43.0 - 77.0 %   Lymphocytes Relative 28.8 12.0 - 46.0 %   Monocytes Relative 9.3 3.0 - 12.0 %   Eosinophils Relative 0.3 0.0 - 5.0 %  Basophils Relative 0.9 0.0 - 3.0 %   Neutro Abs 3.4 1.4 - 7.7 K/uL   Lymphs Abs 1.6 0.7 - 4.0 K/uL   Monocytes Absolute 0.5 0.1 - 1.0 K/uL   Eosinophils Absolute 0.0 0.0 - 0.7 K/uL   Basophils Absolute 0.0 0.0 - 0.1 K/uL  VITAMIN D 25 Hydroxy (Vit-D Deficiency, Fractures)  Result Value Ref Range   VITD 16.28 (L) 30.00 - 100.00 ng/mL  Vitamin B12  Result Value Ref Range   Vitamin B-12 325 211 - 911 pg/mL  Basic metabolic panel  Result Value Ref Range   Sodium 142 135 - 145 mEq/L   Potassium 4.1 3.5 - 5.1 mEq/L   Chloride 103 96 - 112 mEq/L   CO2 32 19 - 32 mEq/L   Glucose, Bld 95 70 - 99 mg/dL   BUN 11 6 - 23 mg/dL   Creatinine, Ser 0.71 0.40 - 1.20 mg/dL   Calcium 10.1 8.4 - 10.5 mg/dL   GFR 85.44 >60.00 mL/min  Lipid panel  Result Value Ref Range   Cholesterol 217 (H) 0 - 200 mg/dL   Triglycerides 52.0 0.0 - 149.0 mg/dL    HDL 78.60 >39.00 mg/dL   VLDL 10.4 0.0 - 40.0 mg/dL   LDL Cholesterol 128 (H) 0 - 99 mg/dL   Total CHOL/HDL Ratio 3    NonHDL 138.77       Assessment & Plan:   Problem List Items Addressed This Visit    Advanced care planning/counseling discussion - Primary    Advanced directives: Has info at home. Has not filled out. Would want sons to be HCPOA.       HLD (hyperlipidemia)    Chronic, stable off meds. Encouraged healthy diet changes to improve LDL.  The 10-year ASCVD risk score [name], et al., 2013) is: 12.9%   Values used to calculate the score:     Age: 88 years     Sex: Female     Is Non-Hispanic African American: No     Diabetic: No     Tobacco smoker: No     Systolic Blood Pressure: 161 mmHg     Is BP treated: No     HDL Cholesterol: 78.6 mg/dL     Total Cholesterol: 217 mg/dL       Memory deficit    Mild short term deficits endorsed by pt and husband. fmhx dementia. Encouraged regular reading, social engagement, physical activity. Offered 6 mo f/u to check memory, pt declines. Briefly discussed possible medication      Osteoporosis    Completed ~7-8 yrs bisphosphonate. Recent dental work. We will check on prolia cost for patient.  She was not taking vitamin D or calcium - rec restart both.  Continue regular weight bearing exercise.       Relevant Medications   Calcium Carb-Cholecalciferol (CALCIUM-VITAMIN D) 600-400 MG-UNIT TABS   Cholecalciferol (VITAMIN D) 2000 units CAPS   Vitamin B12 deficiency    Levels dropping off replacement - advised start MVI with B12 or B complex vitamin.      Vitamin D deficiency    Chronic, deteriorated off replacement. Advised restart this.           Meds ordered this encounter  Medications  . Calcium Carb-Cholecalciferol (CALCIUM-VITAMIN D) 600-400 MG-UNIT TABS    Sig: Take 1 tablet by mouth daily.    Dispense:  60 tablet  . Cholecalciferol (VITAMIN D) 2000 units CAPS    Sig: Take 1 capsule (2,000 Units total)  by  mouth daily.    Dispense:  30 capsule   No orders of the defined types were placed in this encounter.   Follow up plan: Return in about 1 year (around 04/19/2019) for medicare wellness visit.  Ria Bush, MD

## 2018-04-18 NOTE — Assessment & Plan Note (Addendum)
Chronic, stable off meds. Encouraged healthy diet changes to improve LDL.  The 10-year ASCVD risk score [name], et al., 2013) is: 12.9%   Values used to calculate the score:     Age: 77 years     Sex: Female     Is Non-Hispanic African American: No     Diabetic: No     Tobacco smoker: No     Systolic Blood Pressure: 027 mmHg     Is BP treated: No     HDL Cholesterol: 78.6 mg/dL     Total Cholesterol: 217 mg/dL

## 2018-04-18 NOTE — Assessment & Plan Note (Signed)
Chronic, deteriorated off replacement. Advised restart this.

## 2018-04-18 NOTE — Assessment & Plan Note (Signed)
Mild short term deficits endorsed by pt and husband. fmhx dementia. Encouraged regular reading, social engagement, physical activity. Offered 6 mo f/u to check memory, pt declines. Briefly discussed possible medication

## 2018-04-18 NOTE — Assessment & Plan Note (Signed)
Advanced directives: Has info at home. Has not filled out. Would want sons to be HCPOA.

## 2018-06-01 NOTE — Telephone Encounter (Signed)
Spoke to pt and advised of out of pocket expense. Pt states that she does not recall discussing any osteoporosis Tx and is wanting to speak with Dr Danise Mina. She states that she "talked about so much" at her appointment that she "must have missed something." pls advise

## 2018-06-01 NOTE — Telephone Encounter (Signed)
Verification of benefits have been processed and an approval has been received for pts prolia injection. Pts estimated cost are appx $250. This is only an estimate and cannot be confirmed until benefits are paid. Please advise pt and schedule if needed. If scheduled, once the injection is received, pls contact me back with the date it was received so that I am able to update prolia folder. Thanks  Pt is also responsible for a $185 deducible that she has not yet met

## 2018-06-13 NOTE — Telephone Encounter (Addendum)
plz call and remind - we did discuss at last visit - she had completed 7-8 years of bisphosphonate (fosamax I believe) treatment, and we recommended restarting calcium and vitamin D and weight bearing exercises, and had decided to check on cost for prolia injectable osteoporosis medication Q6 mo to see if she would be interested in this. If she prefers, we can review at next visit in detail.

## 2018-06-13 NOTE — Telephone Encounter (Signed)
Spoke with pt relaying Dr. Synthia Innocent message.  Pt states she does not think the osteoporosis is any worse than previously. Says she walks daily and does not think she needs further tx.  However, pt scheduled an acute OV for 06/19/18 for another issue.

## 2018-06-19 ENCOUNTER — Ambulatory Visit: Payer: Medicare Other | Admitting: Family Medicine

## 2018-07-13 ENCOUNTER — Ambulatory Visit: Payer: Medicare Other | Admitting: Family Medicine

## 2018-07-18 ENCOUNTER — Encounter: Payer: Self-pay | Admitting: Internal Medicine

## 2018-07-18 ENCOUNTER — Ambulatory Visit (INDEPENDENT_AMBULATORY_CARE_PROVIDER_SITE_OTHER): Payer: Medicare Other | Admitting: Internal Medicine

## 2018-07-18 VITALS — BP 118/74 | HR 108 | Temp 97.6°F | Wt 100.0 lb

## 2018-07-18 DIAGNOSIS — R7301 Impaired fasting glucose: Secondary | ICD-10-CM | POA: Diagnosis not present

## 2018-07-18 DIAGNOSIS — R300 Dysuria: Secondary | ICD-10-CM | POA: Diagnosis not present

## 2018-07-18 DIAGNOSIS — E8889 Other specified metabolic disorders: Secondary | ICD-10-CM | POA: Diagnosis not present

## 2018-07-18 DIAGNOSIS — R350 Frequency of micturition: Secondary | ICD-10-CM | POA: Diagnosis not present

## 2018-07-18 DIAGNOSIS — K644 Residual hemorrhoidal skin tags: Secondary | ICD-10-CM

## 2018-07-18 LAB — POC URINALSYSI DIPSTICK (AUTOMATED)
Bilirubin, UA: NEGATIVE
GLUCOSE UA: NEGATIVE
Nitrite, UA: NEGATIVE
PH UA: 6 (ref 5.0–8.0)
Protein, UA: POSITIVE — AB
Spec Grav, UA: 1.025 (ref 1.010–1.025)
Urobilinogen, UA: 0.2 E.U./dL

## 2018-07-18 MED ORDER — NITROFURANTOIN MONOHYD MACRO 100 MG PO CAPS: 100.0000 mg | ORAL_CAPSULE | Freq: Two times a day (BID) | ORAL | 0 refills | Status: DC

## 2018-07-18 MED ORDER — HYDROCORTISONE 2.5 % RE CREA: 1.0000 "application " | TOPICAL_CREAM | Freq: Two times a day (BID) | RECTAL | 0 refills | Status: DC

## 2018-07-18 NOTE — Progress Notes (Signed)
HPI  Pt presents to the clinic today with c/o urinary frequency and dysuria. She reports this started 2 days ago. She denies fever, chills, nausea or low back pain. She has tried increasing her water intake.   She also reports persistent external hemorrhoids. She reports this started 3 weeks ago. She has not noticed any bleeding or pain but does have some itching. She is not constipated. She is drinking lots of water. She has been trying Tucks Pad with minimal relief.  Review of Systems  Past Medical History:  Diagnosis Date  . Acute deep vein thrombosis (DVT) of distal end of left lower extremity ([place]) 10/04/2016   Acute DVT of the left gastrocnemius veins. Acute SVT of the left lesser saphenous vein, beginning approximately 0.4- 0.5cm from its take-off from the popliteal vein. (09/2016)  . DVT (deep venous thrombosis) ([place]) 09/2016   s/p 3 mo xarelto  . Hyperlipidemia 05/2003  . Osteoporosis 2008   took actonel for 5 yrs  . Rectal bleeding 10/22/2016   xarelto related.     Family History  Problem Relation Age of Onset  . Stroke Mother   . Coronary artery disease Mother   . Cancer Father 84       Lung, smoker  . Diabetes Father   . Cancer Brother 24       AML Leukemia with mets to brain radiation  . Alcohol abuse Brother 60       alcohol abuse  . Cancer Sister        bladder, nonsmoker  . Colon cancer Neg Hx   . Breast cancer Neg Hx     Social History   Socioeconomic History  . Marital status: Married    Spouse name: Not on file  . Number of children: Not on file  . Years of education: Not on file  . Highest education level: Not on file  Occupational History  . Not on file  Social Needs  . Financial resource strain: Not on file  . Food insecurity:    Worry: Not on file    Inability: Not on file  . Transportation needs:    Medical: Not on file    Non-medical: Not on file  Tobacco Use  . Smoking status: Never Smoker  . Smokeless tobacco: Never Used  Substance  and Sexual Activity  . Alcohol use: No  . Drug use: No  . Sexual activity: Never  Lifestyle  . Physical activity:    Days per week: Not on file    Minutes per session: Not on file  . Stress: Not on file  Relationships  . Social connections:    Talks on phone: Not on file    Gets together: Not on file    Attends religious service: Not on file    Active member of club or organization: Not on file    Attends meetings of clubs or organizations: Not on file    Relationship status: Not on file  . Intimate partner violence:    Fear of current or ex partner: Not on file    Emotionally abused: Not on file    Physically abused: Not on file    Forced sexual activity: Not on file  Other Topics Concern  . Not on file  Social History Narrative   Caffeine: none   Lives with husband, 2 grown children. Husband just retired.    Occupation: retired, prior worked for [employer] and [employer]   Activity: tries to walk daily  Diet: good water, fruits/vegetables daily    Allergies  Allergen Reactions  . Codeine     REACTION: DIZZY/RAPID HEARTBEAT  . Penicillins     REACTION: UNSPECIFIED     Constitutional: Denies fever, malaise, fatigue, headache or abrupt weight changes.   GU: Pt reports frequency and pain with urination. Denies urgency, burning sensation, blood in urine, odor or discharge. GI: Pt reports external hemorrhoids. Denies abdominal pain, nausea, vomiting, diarrhea, constipation or blood in her stool.   No other specific complaints in a complete review of systems (except as listed in HPI above).    Objective:   Physical Exam  BP 118/74   Pulse (!) 108   Temp 97.6 F (36.4 C) (Oral)   Wt 100 lb (45.4 kg)   SpO2 96%   BMI 18.29 kg/m   Wt Readings from Last 3 Encounters:  07/18/18 100 lb (45.4 kg)  04/18/18 106 lb 8 oz (48.3 kg)  04/17/18 106 lb 8 oz (48.3 kg)    General: Appears her stated age, well developed, well nourished in NAD. Abdomen: Soft. Normal bowel  sounds. No distention or masses noted.  Tender to palpation over the bladder area. No CVA tenderness.        Assessment & Plan:   Frequency, Dysuria secondary to UTI:  Urinalysis: 2+ leuks, 2+ blood, 2+ ketones Will send urine culture eRx sent if for Macrobid 100 mg BID x 5 days OK to take AZO OTC Drink plenty of fluids  External Hemorrhoids:  Encouraged fluid intake eRx for Proctosol BID until resolved  Ketosis:  CMET and A1C today  RTC as needed or if symptoms persist. Webb Silversmith, NP

## 2018-07-18 NOTE — Patient Instructions (Signed)
Hemorrhoids    Hemorrhoids are swollen veins in and around the rectum or anus. Hemorrhoids can cause pain, itching, or bleeding. Most of the time, they do not cause serious problems. They usually get better with diet changes, lifestyle changes, and other home treatments.  Follow these instructions at home:  Eating and drinking  · Eat foods that have fiber, such as whole grains, beans, nuts, fruits, and vegetables. Ask your doctor about taking products that have added fiber (fiber supplements).  · Drink enough fluid to keep your pee (urine) clear or pale yellow.  For Pain and Swelling  · Take a warm-water bath (sitz bath) for 20 minutes to ease pain. Do this 3-4 times a day.  · If directed, put ice on the painful area. It may be helpful to use ice between your warm baths.  ¨ Put ice in a plastic bag.  ¨ Place a towel between your skin and the bag.  ¨ Leave the ice on for 20 minutes, 2-3 times a day.  General instructions  · Take over-the-counter and prescription medicines only as told by your doctor.  ¨ Medicated creams and medicines that are inserted into the anus (suppositories) may be used or applied as told.  · Exercise often.  · Go to the bathroom when you have the urge to poop (to have a bowel movement). Do not wait.  · Avoid pushing too hard (straining) when you poop.  · Keep the butt area dry and clean. Use wet toilet paper or moist paper towels.  · Do not sit on the toilet for a long time.  Contact a doctor if:  · You have any of these:  ¨ Pain and swelling that do not get better with treatment or medicine.  ¨ Bleeding that will not stop.  ¨ Trouble pooping or you cannot poop.  ¨ Pain or swelling outside the area of the hemorrhoids.  This information is not intended to replace advice given to you by your health care provider. Make sure you discuss any questions you have with your health care provider.  Document Released: 08/31/2008 Document Revised: 04/29/2016 Document Reviewed: 08/06/2015  Elsevier  Interactive Patient Education © 2018 Elsevier Inc.   

## 2018-07-18 NOTE — Addendum Note (Signed)
Addended by: Ellamae Sia on: 07/18/2018 01:06 PM   Modules accepted: Orders

## 2018-07-19 LAB — URINE CULTURE
MICRO NUMBER:: 90959310
SPECIMEN QUALITY:: ADEQUATE

## 2018-10-04 DIAGNOSIS — Z23 Encounter for immunization: Secondary | ICD-10-CM | POA: Diagnosis not present

## 2018-11-04 ENCOUNTER — Ambulatory Visit
Admission: EM | Admit: 2018-11-04 | Discharge: 2018-11-04 | Disposition: A | Discharge status: Home / Self Care | Payer: Medicare Other | Attending: Family Medicine | Admitting: Family Medicine

## 2018-11-04 ENCOUNTER — Other Ambulatory Visit: Payer: Self-pay

## 2018-11-04 ENCOUNTER — Ambulatory Visit
Admission: RE | Admit: 2018-11-04 | Discharge: 2018-11-04 | Disposition: A | Discharge status: Home / Self Care | Payer: Medicare Other | Source: Ambulatory Visit | Attending: Family Medicine | Admitting: Family Medicine

## 2018-11-04 ENCOUNTER — Encounter: Payer: Self-pay | Admitting: Emergency Medicine

## 2018-11-04 ENCOUNTER — Telehealth: Payer: Self-pay | Admitting: Family Medicine

## 2018-11-04 DIAGNOSIS — M79662 Pain in left lower leg: Secondary | ICD-10-CM | POA: Diagnosis present

## 2018-11-04 DIAGNOSIS — Z86718 Personal history of other venous thrombosis and embolism: Secondary | ICD-10-CM | POA: Diagnosis not present

## 2018-11-04 DIAGNOSIS — I8392 Asymptomatic varicose veins of left lower extremity: Secondary | ICD-10-CM | POA: Insufficient documentation

## 2018-11-04 DIAGNOSIS — I82812 Embolism and thrombosis of superficial veins of left lower extremities: Secondary | ICD-10-CM | POA: Diagnosis not present

## 2018-11-04 DIAGNOSIS — I82462 Acute embolism and thrombosis of left calf muscular vein: Secondary | ICD-10-CM | POA: Insufficient documentation

## 2018-11-04 MED ORDER — RIVAROXABAN (XARELTO) VTE STARTER PACK (15 & 20 MG): ORAL_TABLET | ORAL | 0 refills | Status: DC

## 2018-11-04 NOTE — Discharge Instructions (Signed)
I will call with the results.  Take care  Dr. Korrin Waterfield  

## 2018-11-04 NOTE — Telephone Encounter (Signed)
Patient notified of positive venous ultrasound.  Placing on Xarelto.  Advised to follow-up with PCP this week.

## 2018-11-04 NOTE — ED Triage Notes (Signed)
Pt c/o leg pain on her left leg, calf area. She also has a lump in that area. Pain is localized to the area where the lump is and only when touching the area. No known injury. No bruising or swelling. No recent long travel
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
## 2018-11-06 ENCOUNTER — Telehealth: Payer: Self-pay

## 2018-11-06 ENCOUNTER — Ambulatory Visit: Payer: Medicare Other | Admitting: Family Medicine

## 2018-11-06 NOTE — Telephone Encounter (Signed)
Patient calls in to report a knot in her left calf that is tender and red.  She denies any warmth but does note slight swelling.  Triage with [facility] instructed patient to be seen within 4 hours for evaluation.  Patient did go to ER and was diagnosed with a DVT, she is to follow up with provider in 2 days.  Patient has appointment tomorrow 12/3 with PCP Dr. Danise Mina for follow up.

## 2018-11-07 ENCOUNTER — Encounter: Payer: Self-pay | Admitting: Family Medicine

## 2018-11-07 ENCOUNTER — Ambulatory Visit (INDEPENDENT_AMBULATORY_CARE_PROVIDER_SITE_OTHER): Payer: Medicare Other | Admitting: Family Medicine

## 2018-11-07 VITALS — BP 126/78 | HR 97 | Temp 97.8°F | Ht 62.0 in | Wt 97.5 lb

## 2018-11-07 DIAGNOSIS — I82402 Acute embolism and thrombosis of unspecified deep veins of left lower extremity: Secondary | ICD-10-CM | POA: Insufficient documentation

## 2018-11-07 DIAGNOSIS — R413 Other amnesia: Secondary | ICD-10-CM | POA: Diagnosis not present

## 2018-11-07 LAB — POC URINALSYSI DIPSTICK (AUTOMATED)
BILIRUBIN UA: NEGATIVE
Glucose, UA: NEGATIVE
Leukocytes, UA: NEGATIVE
Nitrite, UA: NEGATIVE
Protein, UA: NEGATIVE
SPEC GRAV UA: 1.025 (ref 1.010–1.025)
Urobilinogen, UA: 0.2 E.U./dL
pH, UA: 6 (ref 5.0–8.0)

## 2018-11-07 MED ORDER — APIXABAN 5 MG PO TABS: 5.0000 mg | ORAL_TABLET | Freq: Two times a day (BID) | ORAL | 3 refills | Status: DC

## 2018-11-07 NOTE — Progress Notes (Addendum)
BP 126/78 (BP Location: Left Arm, Patient Position: Sitting, Cuff Size: Normal)   Pulse 97   Temp 97.8 F (36.6 C) (Oral)   Ht 5\' 2"  (1.575 m)   Wt 97 lb 8 oz (44.2 kg)   SpO2 93%   BMI 17.83 kg/m    CC: recurrent DVT Subjective:    Patient ID: Kimberly Davies, female    DOB: 02-18-1941, 77 y.o.   MRN: [id number]  HPI: Kimberly Davies is a 77 y.o. female presenting on 11/07/2018 for Hospitalization Follow-up (Seen at [facility] on 11/04/18. Pt accompanied by her husband, Kimberly Davies. States they did not know the doctor from [facility] was sending rx for blood thinner.  So they have not picked it up yet. )   Recent [place] [facility] visit for L calf pain and knot noted - venous US returned showing superficial lesser saphenous thrombosis as well as DVT of L gastrocnemius vein - started on xarelto - has not started yet, was unaware it was sent in. This year denies prolonged travel recent surgery or other inciting events. She walks regularly. Knot left calf was newly noted this week. She has not been using compression stockings (thought today's regular leg hose was compression stocking).   H/o DVT in same L gastroc vein and SVT in superficial saphenous on left (09/2016) completed 3 months of xarelto. She did suffer complication of rectal bleeding while on xarelto (10/2016). She did have rpt [place] 10/2016 - which showed DVT had cleared.  Hypercoagulability panel returned normal off xarelto (01/2017).   US Venous Img Lower Unilateral Left CLINICAL DATA:  Left calf pain  EXAM: LEFT LOWER EXTREMITY VENOUS DUPLEX ULTRASOUND  TECHNIQUE: Doppler venous assessment of the left lower extremity deep venous system was performed, including characterization of spectral flow, compressibility, and phasicity.  COMPARISON:  None.  FINDINGS: There is complete compressibility of the left common femoral, femoral, and popliteal veins. Doppler analysis demonstrates respiratory phasicity and augmentation of flow with  calf compression.  There is venous thrombosis within the calf. There is nonocclusive thrombus in the lesser saphenous vein which is compatible with superficial vein thrombosis. There is also thrombosis within associated superficial varicosities. There are small segment of dual gastrocnemius veins with occlusive thrombus. This is compatible with calf vein DVT.  IMPRESSION: There is no evidence of DVT in the left common femoral, femoral, or popliteal veins.  The study is positive for DVT within the gastrocnemius veins in the calf. There is also associated superficial vein thrombosis in the lesser saphenous vein and associated superficial varicosities.  Electronically Signed   By: Marybelle Killings M.D.   On: 11/04/2018 12:58   Denies unexpected weight loss, fevers/chills, night sweats, coughing, early satiety, dysphagia, blood in stool or urine, gassiness or bloating or indigestion. Some incomplete bowel emptying. Denies chest pain or dyspnea.  Stays without appetite.  Non smoker.  No printing fume exposure.   Relevant past medical, surgical, family and social history reviewed and updated as indicated. Interim medical history since our last visit reviewed. Allergies and medications reviewed and updated. Outpatient Medications Prior to Visit  Medication Sig Dispense Refill  . Calcium Carb-Cholecalciferol (CALCIUM-VITAMIN D) 600-400 MG-UNIT TABS Take 1 tablet by mouth daily. (Patient not taking: Reported on 11/07/2018) 60 tablet   . Cholecalciferol (VITAMIN D) 2000 units CAPS Take 1 capsule (2,000 Units total) by mouth daily. (Patient not taking: Reported on 11/07/2018) 30 capsule   . Rivaroxaban 15 & 20 MG TBPK 15mg  twice a day with food  for 21 days. Then 20 mg daily. (Patient not taking: Reported on 11/07/2018) 51 each 0   No facility-administered medications prior to visit.      Per HPI unless specifically indicated in ROS section below Review of Systems     Objective:    BP 126/78  (BP Location: Left Arm, Patient Position: Sitting, Cuff Size: Normal)   Pulse 97   Temp 97.8 F (36.6 C) (Oral)   Ht 5\' 2"  (1.575 m)   Wt 97 lb 8 oz (44.2 kg)   SpO2 93%   BMI 17.83 kg/m   Wt Readings from Last 3 Encounters:  11/07/18 97 lb 8 oz (44.2 kg)  11/04/18 112 lb (50.8 kg)  07/18/18 100 lb (45.4 kg)   11/30 weight likely was a mistake (at [facility])  Physical Exam  Constitutional: She appears well-developed and well-nourished. No distress.  HENT:  Mouth/Throat: Oropharynx is clear and moist. No oropharyngeal exudate.  Eyes: Pupils are equal, round, and reactive to light. EOM are normal.  Neck: No thyromegaly present.  Cardiovascular: Normal rate, regular rhythm and normal heart sounds.  No murmur heard. Pulmonary/Chest: Effort normal and breath sounds normal. No respiratory distress. She has no wheezes. She has no rales.  Abdominal: Soft. Bowel sounds are normal. She exhibits no distension and no mass. There is no tenderness. There is no rebound and no guarding. No hernia.  Musculoskeletal: She exhibits no edema.  Tender palpable cord left calf without significant erythema or edema or warmth of leg 2+ DP bilaterally  Skin: Skin is warm. No rash noted.  Psychiatric: She has a normal mood and affect.  Nursing note and vitals reviewed.  Results for orders placed or performed in visit on 11/07/18  POCT Urinalysis Dipstick (Automated)  Result Value Ref Range   Color, UA yellow    Clarity, UA clear    Glucose, UA Negative Negative   Bilirubin, UA negative    Ketones, UA 1+    Spec Grav, UA 1.025 1.010 - 1.025   Blood, UA +/-    pH, UA 6.0 5.0 - 8.0   Protein, UA Negative Negative   Urobilinogen, UA 0.2 0.2 or 1.0 E.U./dL   Nitrite, UA negative    Leukocytes, UA Negative Negative   Micro: WBC rare RBC 0-3 Epi few Bact tr Casts none Mucous present UCx not sent  Lab Results  Component Value Date   CREATININE 0.71 04/17/2018   BUN 11 04/17/2018   NA 142 04/17/2018    K 4.1 04/17/2018   CL 103 04/17/2018   CO2 32 04/17/2018    Lab Results  Component Value Date   WBC 5.6 04/17/2018   HGB 13.0 04/17/2018   HCT 39.1 04/17/2018   MCV 87.6 04/17/2018   PLT 331.0 04/17/2018       Assessment & Plan:   Problem List Items Addressed This Visit    Recurrent acute deep vein thrombosis (DVT) of left lower extremity (HCC) - Primary    Initial gastroc DVT (+ SVT) 10/2016 s/p 3 months xarelto that subsequently fully cleared. Anticoagulation panel was negative 01/2017. Now with new gastroc DVT (+ SVT), unprovoked. I quoted 10-15% recurrence chance in the first year and 5-7% yearly recurrence rate afterwards. We need to be cautious in h/o rectal bleeding while on xarelto. Will start eliquis 5mg  bid hopeful for safer GI profile. Will refer to heme for recommendations on duration of therapy.  Largely UTD on cancer screenings, asxs pelvic. Check UA today.  Relevant Medications   apixaban (ELIQUIS) 5 MG TABS tablet   Other Relevant Orders   Ambulatory referral to Hematology   POCT Urinalysis Dipstick (Automated) (Completed)   Memory deficit    Oh by the way - husband endorses increased memory trouble noted recently. Will return to review this with formal geriatric assessment.          Meds ordered this encounter  Medications  . apixaban (ELIQUIS) 5 MG TABS tablet    Sig: Take 1 tablet (5 mg total) by mouth 2 (two) times daily.    Dispense:  60 tablet    Refill:  3   Orders Placed This Encounter  Procedures  . Ambulatory referral to Hematology    Referral Priority:   Routine    Referral Type:   Consultation    Referral Reason:   Specialty Services Required    Requested Specialty:   Oncology    Number of Visits Requested:   1  . POCT Urinalysis Dipstick (Automated)    Follow up plan: Return in about 3 months (around 02/06/2019) for follow up visit.  Ria Bush, MD

## 2018-11-07 NOTE — Assessment & Plan Note (Addendum)
Initial gastroc DVT (+ SVT) 10/2016 s/p 3 months xarelto that subsequently fully cleared. Anticoagulation panel was negative 01/2017. Now with new gastroc DVT (+ SVT), unprovoked. I quoted 10-15% recurrence chance in the first year and 5-7% yearly recurrence rate afterwards. We need to be cautious in h/o rectal bleeding while on xarelto. Will start eliquis 5mg  bid hopeful for safer GI profile. Will refer to heme for recommendations on duration of therapy.  Largely UTD on cancer screenings, asxs pelvic. Check UA today.

## 2018-11-07 NOTE — Assessment & Plan Note (Addendum)
Oh by the way - husband endorses increased memory trouble noted recently. Will return to review this with formal geriatric assessment.

## 2018-11-07 NOTE — Patient Instructions (Addendum)
Urinalysis today.  I recommend you start eliquis 5mg  twice daily. This is second blood clot, first unprovoked one. We may need longer course of therapy.  We will refer you to hematology for recommendations on duration of treatment.  For appetite - try boost or ensure supplement 1/2-1 can before dinner to boost appetite.

## 2018-11-08 ENCOUNTER — Telehealth: Payer: Self-pay

## 2018-11-08 NOTE — Telephone Encounter (Addendum)
Attempted to contact pt. No answer. No vm.  Need to relay results and instructions below, per Dr. Darnell Level.   Results Urine returned normal, slightly concentrated - increase water intake.

## 2018-11-09 ENCOUNTER — Telehealth: Payer: Self-pay

## 2018-11-09 NOTE — Telephone Encounter (Signed)
Spoke with pt's husband, [name] (on), relaying lab results and message per Dr. Henriette Combs understanding. [See Result Note in Lab, 11/07/18.]

## 2018-11-09 NOTE — Telephone Encounter (Signed)
[facility] faxed note pt needs to know when appt at [facility] is. I spoke with pt and she had made a mistake and pt does not have appt at [facility]. Pt is aware has appt 11/13/18 @ 9:30 at [facility] center. Nothing further needed.

## 2018-11-09 NOTE — Telephone Encounter (Signed)
Mr Kozakiewicz calling back to see who has been calling pt; Lattie Haw CMA will speak with Mr Gwilliam.

## 2018-11-13 ENCOUNTER — Encounter: Payer: Self-pay | Admitting: Oncology

## 2018-11-13 ENCOUNTER — Other Ambulatory Visit: Payer: Self-pay

## 2018-11-13 ENCOUNTER — Inpatient Hospital Stay: Payer: Medicare Other | Attending: Oncology | Admitting: Oncology

## 2018-11-13 VITALS — BP 117/79 | HR 85 | Temp 97.1°F | Resp 18 | Ht 63.0 in | Wt 96.4 lb

## 2018-11-13 DIAGNOSIS — I82402 Acute embolism and thrombosis of unspecified deep veins of left lower extremity: Secondary | ICD-10-CM

## 2018-11-13 DIAGNOSIS — I8390 Asymptomatic varicose veins of unspecified lower extremity: Secondary | ICD-10-CM

## 2018-11-13 DIAGNOSIS — Z86718 Personal history of other venous thrombosis and embolism: Secondary | ICD-10-CM | POA: Diagnosis not present

## 2018-11-13 DIAGNOSIS — Z7901 Long term (current) use of anticoagulants: Secondary | ICD-10-CM | POA: Insufficient documentation
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
## 2018-11-13 NOTE — Progress Notes (Signed)
Patient here for initial visit. °

## 2018-11-20 ENCOUNTER — Encounter: Payer: Self-pay | Admitting: Family Medicine

## 2018-11-20 ENCOUNTER — Ambulatory Visit (INDEPENDENT_AMBULATORY_CARE_PROVIDER_SITE_OTHER): Payer: Medicare Other | Admitting: Family Medicine

## 2018-11-20 VITALS — BP 114/76 | HR 84 | Temp 97.8°F | Ht 62.0 in | Wt 95.8 lb

## 2018-11-20 DIAGNOSIS — R413 Other amnesia: Secondary | ICD-10-CM

## 2018-11-20 DIAGNOSIS — I82402 Acute embolism and thrombosis of unspecified deep veins of left lower extremity: Secondary | ICD-10-CM

## 2018-11-20 NOTE — Progress Notes (Signed)
BP 114/76 (BP Location: Left Arm, Patient Position: Sitting, Cuff Size: Normal)   Pulse 84   Temp 97.8 F (36.6 C) (Oral)   Ht 5\' 2"  (1.575 m)   Wt 95 lb 12 oz (43.4 kg)   SpO2 97%   BMI 17.51 kg/m    CC: f/u L leg pain Subjective:    Patient ID: Kimberly Davies, female    DOB: Sep 11, 1941, 77 y.o.   MRN: [id number]  HPI: Kimberly Davies is a 77 y.o. female presenting on 11/20/2018 for Leg Pain (Here for f/u of left leg pain. Pt is accompanied by her husband, [name].)   See prior note for details. Seen late last month with dx L gastroc DVT started on eliquis 5mg  bid. Saw heme - note reviewed - rec 3 months of eliquis 5mg  bid then switch to 2.5mg  bid maintenance.   Hasn't tried compression stockings yet - they were out of her size.   No fevers/chills, redness, swelling or warmth of legs, leg cramping.   Relevant past medical, surgical, family and social history reviewed and updated as indicated. Interim medical history since our last visit reviewed. Allergies and medications reviewed and updated. Outpatient Medications Prior to Visit  Medication Sig Dispense Refill  . apixaban (ELIQUIS) 5 MG TABS tablet Take 1 tablet (5 mg total) by mouth 2 (two) times daily. 60 tablet 3   No facility-administered medications prior to visit.      Per HPI unless specifically indicated in ROS section below Review of Systems     Objective:    BP 114/76 (BP Location: Left Arm, Patient Position: Sitting, Cuff Size: Normal)   Pulse 84   Temp 97.8 F (36.6 C) (Oral)   Ht 5\' 2"  (1.575 m)   Wt 95 lb 12 oz (43.4 kg)   SpO2 97%   BMI 17.51 kg/m   Wt Readings from Last 3 Encounters:  11/20/18 95 lb 12 oz (43.4 kg)  11/13/18 96 lb 6.4 oz (43.7 kg)  11/07/18 97 lb 8 oz (44.2 kg)    Physical Exam Vitals signs and nursing note reviewed.  Constitutional:      General: She is not in acute distress.    Appearance: Normal appearance.  Musculoskeletal: Normal range of motion.        General:  Tenderness present. No swelling.     Right lower leg: No edema.     Left lower leg: No edema.     Comments: 2+ DP on right, 1+ DP on left No pedal edema or erythema Tender cord L calf  Skin:    General: Skin is warm and dry.     Findings: No erythema or rash.  Neurological:     Mental Status: She is alert.  Psychiatric:        Mood and Affect: Mood normal.     Comments: Seems less sure of herself with questions today    Lab Results  Component Value Date   CREATININE 0.71 04/17/2018   BUN 11 04/17/2018   NA 142 04/17/2018   K 4.1 04/17/2018   CL 103 04/17/2018   CO2 32 04/17/2018       Assessment & Plan:   Problem List Items Addressed This Visit    Recurrent acute deep vein thrombosis (DVT) of left lower extremity ([place]) - Primary    Main concern with persistent leg ache. Overall reassuring exam. Discussed with patient - anticipate irritation of SVT/DVT. rec tylenol. Could consider voltaren gel if tylenol not  helping.      Memory deficit    Today seems less sure of herself. I asked her to return in the next month for formal geriatric assessment with MMSE.          No orders of the defined types were placed in this encounter.  No orders of the defined types were placed in this encounter.   Follow up plan: Return in about 4 weeks (around 12/18/2018) for follow up visit.  Ria Bush, MD

## 2018-11-20 NOTE — Patient Instructions (Signed)
Try compression stockings.  Continue eliquis 5mg  twice daily for 3 months.  Elevate legs May take tylenol 500mg  2-3 times daily as needed for leg pain.  Return in 4-6 weeks for follow up visit.

## 2018-11-20 NOTE — Assessment & Plan Note (Signed)
Main concern with persistent leg ache. Overall reassuring exam. Discussed with patient - anticipate irritation of SVT/DVT. rec tylenol. Could consider voltaren gel if tylenol not helping.

## 2018-11-20 NOTE — Assessment & Plan Note (Addendum)
Today seems less sure of herself. I asked her to return in the next month for formal geriatric assessment with MMSE.

## 2018-12-17 ENCOUNTER — Encounter: Payer: Self-pay | Admitting: Family Medicine

## 2018-12-17 NOTE — Progress Notes (Signed)
BP 120/68 (BP Location: Left Arm, Patient Position: Sitting, Cuff Size: Normal)   Pulse 83   Temp 97.9 F (36.6 C) (Oral)   Ht 5\' 2"  (1.575 m)   Wt 97 lb 4 oz (44.1 kg)   SpO2 96%   BMI 17.79 kg/m    CC: geriatric assessment Subjective:    Patient ID: Kimberly Davies, female    DOB: 1941/10/21, 78 y.o.   MRN: [id number]  HPI: Kimberly Davies is a 78 y.o. female presenting on 12/18/2018 for Memory Loss (Here for 4-6 wk memory f/u. Pt accompanied by her husband, [name].)   Known recurrent LLE DVT on lifelong eliquis, saw heme rec 3 months of 5mg  bid then may decrease to 2.5mg  bid for maintenance.   Tends to misplace things more recently.  Forgets days of the week.   Completed 1.5 yrs of college Mother with memory trouble at a later age - attributed to mini strokes. Maternal side with alzheimer's disease.  No personal hx strokes. + fmhx strokes.  Significant claustrophobia.   Geriatric Assessment: Activities of Daily Living:     Bathing- independent     Dressing- independent     Eating- independent     Toileting- independent     Transferring- independent     Continence- independent  Overall Assessment: independent   Instrumental Activities of Daily Living:     Transportation- largely dependent     Meal/Food Preparation- independent     Shopping Errands- largely dependent     Housekeeping/Chores- independent     Money Management/Finances- largely dependent     Medication Management- independent     Ability to Use Telephone- independent     Laundry- independent  Overall Assessment:  Partially dependent   Mental Status Exam: (value/max value): 24/30     Clock Drawing Score: /4     Relevant past medical, surgical, family and social history reviewed and updated as indicated. Interim medical history since our last visit reviewed. Allergies and medications reviewed and updated. Outpatient Medications Prior to Visit  Medication Sig Dispense Refill  . apixaban (ELIQUIS) 5  MG TABS tablet Take 1 tablet (5 mg total) by mouth 2 (two) times daily. 60 tablet 3   No facility-administered medications prior to visit.      Per HPI unless specifically indicated in ROS section below Review of Systems Objective:    BP 120/68 (BP Location: Left Arm, Patient Position: Sitting, Cuff Size: Normal)   Pulse 83   Temp 97.9 F (36.6 C) (Oral)   Ht 5\' 2"  (1.575 m)   Wt 97 lb 4 oz (44.1 kg)   SpO2 96%   BMI 17.79 kg/m   Wt Readings from Last 3 Encounters:  12/18/18 97 lb 4 oz (44.1 kg)  11/20/18 95 lb 12 oz (43.4 kg)  11/13/18 96 lb 6.4 oz (43.7 kg)    Physical Exam Vitals signs and nursing note reviewed.  Constitutional:      Appearance: Normal appearance.  Neurological:     Mental Status: She is alert.  Psychiatric:        Mood and Affect: Mood normal.        Behavior: Behavior normal.       Assessment & Plan:   Problem List Items Addressed This Visit    Vitamin D deficiency   Relevant Orders   VITAMIN D 25 Hydroxy (Vit-D Deficiency, Fractures)   Vitamin B12 deficiency   Relevant Orders   Vitamin B12   Recurrent acute deep  vein thrombosis (DVT) of left lower extremity (HCC)    Tolerating eliquis 5mg  bid - continue. Has f/u with heme next month.       Relevant Orders   CBC with Differential/Platelet   MCI (mild cognitive impairment) with memory loss - Primary    MMSE 23/30 consistent with at least MCI r/o dementia although currently able to function relatively well. fmhx alzheimer's and vascular dementia. Will order labs and MRI, and pending results consider aricept. rec start B12 1059mcg daily for h/o borderline levels.       Relevant Orders   Vitamin B12   Comprehensive metabolic panel   TSH   MR Brain Wo Contrast   Anxiety state    Pt requests valium PRN for claustrophobia and upcoming MRI - we have found open MRI for patient to undergo brain MR.       Relevant Medications   diazepam (VALIUM) 2 MG tablet   Abnormal TSH    Update TSH       Relevant Orders   TSH    Other Visit Diagnoses    Alzheimer's disease, unspecified (CODE) ([place])       Relevant Medications   diazepam (VALIUM) 2 MG tablet   Other Relevant Orders   MR Brain Wo Contrast       Meds ordered this encounter  Medications  . diazepam (VALIUM) 2 MG tablet    Sig: Take 0.5-1 tablets (1-2 mg total) by mouth 2 (two) times daily as needed for anxiety (claustrophobia for upcoming MRI).    Dispense:  2 tablet    Refill:  0   Orders Placed This Encounter  Procedures  . MR Brain Wo Contrast    Epic order Wt  97/ ht 5'2/ claus- meds and driver/no metal in eyes/no brain sx/ no eyes,ears,heart sx/ dental implants / no other implants / no needs Ins- mcr/ cigna supp  Pda/pt     Standing Status:   Future    Standing Expiration Date:   02/16/2020    Order Specific Question:   ** REASON FOR EXAM (FREE TEXT)    Answer:   new dementia    Order Specific Question:   What is the patient's sedation requirement?    Answer:   No Sedation    Order Specific Question:   Does the patient have a pacemaker or implanted devices?    Answer:   No    Order Specific Question:   Preferred imaging location?    Answer:   [facility] (table limit-550lbs)    Order Specific Question:   Call Results- Best Contact Number?    Answer:   open MRI requested    Order Specific Question:   Radiology Contrast Protocol - do NOT remove file path    Answer:   [email or web address]  . Vitamin B12  . Comprehensive metabolic panel  . TSH  . CBC with Differential/Platelet  . VITAMIN D 25 Hydroxy (Vit-D Deficiency, Fractures)    Follow up plan: No follow-ups on file.  Ria Bush, MD

## 2018-12-18 ENCOUNTER — Telehealth: Payer: Self-pay | Admitting: Family Medicine

## 2018-12-18 ENCOUNTER — Ambulatory Visit (INDEPENDENT_AMBULATORY_CARE_PROVIDER_SITE_OTHER): Payer: Medicare Other | Admitting: Family Medicine

## 2018-12-18 ENCOUNTER — Encounter: Payer: Self-pay | Admitting: Family Medicine

## 2018-12-18 VITALS — BP 120/68 | HR 83 | Temp 97.9°F | Ht 62.0 in | Wt 97.2 lb

## 2018-12-18 DIAGNOSIS — G3184 Mild cognitive impairment, so stated: Secondary | ICD-10-CM | POA: Diagnosis not present

## 2018-12-18 DIAGNOSIS — G309 Alzheimer's disease, unspecified: Secondary | ICD-10-CM

## 2018-12-18 DIAGNOSIS — E538 Deficiency of other specified B group vitamins: Secondary | ICD-10-CM | POA: Diagnosis not present

## 2018-12-18 DIAGNOSIS — F411 Generalized anxiety disorder: Secondary | ICD-10-CM | POA: Diagnosis not present

## 2018-12-18 DIAGNOSIS — R7989 Other specified abnormal findings of blood chemistry: Secondary | ICD-10-CM

## 2018-12-18 DIAGNOSIS — E559 Vitamin D deficiency, unspecified: Secondary | ICD-10-CM | POA: Diagnosis not present

## 2018-12-18 DIAGNOSIS — I82402 Acute embolism and thrombosis of unspecified deep veins of left lower extremity: Secondary | ICD-10-CM

## 2018-12-18 LAB — COMPREHENSIVE METABOLIC PANEL
ALT: 10 U/L (ref 0–35)
AST: 19 U/L (ref 0–37)
Albumin: 4.2 g/dL (ref 3.5–5.2)
Alkaline Phosphatase: 65 U/L (ref 39–117)
BUN: 9 mg/dL (ref 6–23)
CO2: 33 mEq/L — ABNORMAL HIGH (ref 19–32)
Calcium: 10 mg/dL (ref 8.4–10.5)
Chloride: 103 mEq/L (ref 96–112)
Creatinine, Ser: 0.73 mg/dL (ref 0.40–1.20)
GFR: 82.59 mL/min (ref >60.00)
Glucose, Bld: 94 mg/dL (ref 70–99)
Potassium: 3.7 mEq/L (ref 3.5–5.1)
Sodium: 142 mEq/L (ref 135–145)
Total Bilirubin: 0.8 mg/dL (ref 0.2–1.2)
Total Protein: 7.6 g/dL (ref 6.0–8.3)

## 2018-12-18 LAB — CBC WITH DIFFERENTIAL/PLATELET
BASOS ABS: 0.1 10*3/uL (ref 0.0–0.1)
Basophils Relative: 1 % (ref 0.0–3.0)
Eosinophils Absolute: 0 10*3/uL (ref 0.0–0.7)
Eosinophils Relative: 0.5 % (ref 0.0–5.0)
HCT: 38.7 % (ref 36.0–46.0)
Hemoglobin: 12.9 g/dL (ref 12.0–15.0)
LYMPHS PCT: 31.7 % (ref 12.0–46.0)
Lymphs Abs: 1.7 10*3/uL (ref 0.7–4.0)
MCHC: 33.4 g/dL (ref 30.0–36.0)
MCV: 85.8 fl (ref 78.0–100.0)
Monocytes Absolute: 0.7 10*3/uL (ref 0.1–1.0)
Monocytes Relative: 12.3 % — ABNORMAL HIGH (ref 3.0–12.0)
NEUTROS ABS: 2.9 10*3/uL (ref 1.4–7.7)
Neutrophils Relative %: 54.5 % (ref 43.0–77.0)
Platelets: 368 10*3/uL (ref 150.0–400.0)
RBC: 4.51 Mil/uL (ref 3.87–5.11)
RDW: 14 % (ref 11.5–15.5)
WBC: 5.3 10*3/uL (ref 4.0–10.5)

## 2018-12-18 LAB — VITAMIN B12: Vitamin B-12: 354 pg/mL (ref 211–911)

## 2018-12-18 LAB — VITAMIN D 25 HYDROXY (VIT D DEFICIENCY, FRACTURES): VITD: 15.87 ng/mL — ABNORMAL LOW (ref 30.00–100.00)

## 2018-12-18 LAB — TSH: TSH: 2.58 u[IU]/mL (ref 0.35–4.50)

## 2018-12-18 MED ORDER — DIAZEPAM 2 MG PO TABS: 1.0000 mg | ORAL_TABLET | Freq: Two times a day (BID) | ORAL | 0 refills | Status: DC | PRN

## 2018-12-18 NOTE — Patient Instructions (Addendum)
Memory testing showed some concerns for memory trouble - mainly in short term recall.  I encourage staying physically active, socially engaged, continue reading or doing word puzzles, consider medication like aricept to help slow progression. Labs and MRI ordered today. We will be in touch with results.  Start vitamin B12 1066mcg sublingual if you can find. b

## 2018-12-18 NOTE — Assessment & Plan Note (Signed)
Update TSH

## 2018-12-18 NOTE — Assessment & Plan Note (Signed)
Tolerating eliquis 5mg  bid - continue. Has f/u with heme next month.

## 2018-12-18 NOTE — Assessment & Plan Note (Signed)
Pt requests valium PRN for claustrophobia and upcoming MRI - we have found open MRI for patient to undergo brain MR.

## 2018-12-18 NOTE — Assessment & Plan Note (Signed)
MMSE 23/30 consistent with at least MCI r/o dementia although currently able to function relatively well. fmhx alzheimer's and vascular dementia. Will order labs and MRI, and pending results consider aricept. rec start B12 1065mcg daily for h/o borderline levels.

## 2018-12-18 NOTE — Telephone Encounter (Signed)
-----   Message from Ellamae Sia sent at 12/18/2018  9:43 AM EST ----- Regarding: lab orders for Tuesday, 1.14.20 Thyroid orders??

## 2018-12-21 ENCOUNTER — Other Ambulatory Visit: Payer: Self-pay | Admitting: Family Medicine

## 2018-12-21 ENCOUNTER — Telehealth: Payer: Self-pay | Admitting: *Deleted

## 2018-12-21 ENCOUNTER — Telehealth: Payer: Self-pay

## 2018-12-21 ENCOUNTER — Telehealth: Payer: Self-pay | Admitting: Family Medicine

## 2018-12-21 MED ORDER — VITAMIN D3 1.25 MG (50000 UT) PO TABS: 1.0000 | ORAL_TABLET | ORAL | 1 refills | Status: DC

## 2018-12-21 MED ORDER — VITAMIN B-12 1000 MCG PO TABS: 1000.0000 ug | ORAL_TABLET | Freq: Every day | ORAL | Status: DC

## 2018-12-21 MED ORDER — DONEPEZIL HCL 5 MG PO TABS: 5.0000 mg | ORAL_TABLET | Freq: Every day | ORAL | 6 refills | Status: DC

## 2018-12-21 NOTE — Telephone Encounter (Signed)
Spoke to pts husband who states she is scheduled for an MRI 1/19 @ 0745. He states they have picked up the valium Rx but it did not provide any direction of when it should be taken. HE is inquiring if pt should take a dose the night before and day of, since the procedure is so early in the am, or if she is needing to take a complete dose the morning of. pls advise

## 2018-12-21 NOTE — Telephone Encounter (Addendum)
Attempted to contact pt. No answer. No vm.  Need to relay results, instructions and message per Dr. Darnell Level.  Results Your kidneys, liver, sugar, thyroid, blood counts returned normal.  Your vitamin B12 were in normal range but at low end - start vitamin B12 1000 mcg daily. Your vitamin D remains very low - start vitamin D 50,000 units weekly, which was sent to pharmacy.  We will to a trial of Aricept 5mg  daily (for memory loss), which was sent to pharmacy as well. Watch for nausea and GI upset on this medicine.

## 2018-12-22 NOTE — Telephone Encounter (Signed)
Spoke with pt relaying Dr. G's message. Pt verbalizes understanding.  

## 2018-12-22 NOTE — Telephone Encounter (Signed)
plz phone in vitamin D due to E prescribing error.

## 2018-12-22 NOTE — Telephone Encounter (Signed)
She can take 1/2- 1 tablet the morning of the exam. If desired, should be ok with a full dose (2mg  tablet)

## 2018-12-22 NOTE — Telephone Encounter (Signed)
Pt husband contacted office back; advised per Dr Darnell Level regarding valium

## 2018-12-22 NOTE — Telephone Encounter (Signed)
Spoke with pt relaying results and instructions per Dr. Darnell Level.  Requests results be mailed.  Results mailed to patient.

## 2018-12-24 ENCOUNTER — Ambulatory Visit
Admission: RE | Admit: 2018-12-24 | Discharge: 2018-12-24 | Disposition: A | Discharge status: Home / Self Care | Payer: Medicare Other | Source: Ambulatory Visit | Attending: Family Medicine | Admitting: Family Medicine

## 2018-12-24 DIAGNOSIS — G309 Alzheimer's disease, unspecified: Secondary | ICD-10-CM | POA: Diagnosis not present

## 2018-12-24 DIAGNOSIS — G3184 Mild cognitive impairment, so stated: Secondary | ICD-10-CM

## 2018-12-25 NOTE — Telephone Encounter (Signed)
Refill left on vm at pharmacy.  

## 2018-12-26 ENCOUNTER — Telehealth: Payer: Self-pay

## 2018-12-26 MED ORDER — VITAMIN D3 1.25 MG (50000 UT) PO TABS: 1.0000 | ORAL_TABLET | ORAL | 1 refills | Status: DC

## 2018-12-26 NOTE — Telephone Encounter (Signed)
Sent in new Rx.

## 2018-12-26 NOTE — Telephone Encounter (Addendum)
Spoke with [place] at [facility] informing her Dr. Darnell Level sent in new rx.  Confirms it was received and expresses thanks for calling back.

## 2018-12-26 NOTE — Telephone Encounter (Signed)
[place] left vm at triage indicating they were needing to clarify Vit D Rx. A message was left on the pharmacy vm but did include the quantity, frequency or dose; simply stated Vit D. When speaking with Dollie this am, she is also wanting to confirm that it was written correctly, and that pt is to have VitD 2, not VitD3. pls advise

## 2018-12-26 NOTE — Telephone Encounter (Addendum)
Attempted to contact pt.  No answer.  No vm.  Need to relay results per Dr. Darnell Level.   Results Your MRI returned reassuringly ok.

## 2018-12-26 NOTE — Addendum Note (Signed)
Addended by: Ria Bush on: 12/26/2018 09:03 AM   Modules accepted: Orders

## 2018-12-27 NOTE — Telephone Encounter (Signed)
Results given to patient by telephone and verbalized understanding.

## 2018-12-29 ENCOUNTER — Telehealth: Payer: Self-pay | Admitting: Family Medicine

## 2018-12-29 NOTE — Telephone Encounter (Signed)
Returned call, no answer. Will try later.

## 2018-12-29 NOTE — Telephone Encounter (Signed)
Pt's husband called office to see if he needs to schedule and ov to speak with Dr.G directly about his wife having trouble with her memory. He does not prefer to do it in front of his wife. Best cb # [phone]

## 2019-01-03 NOTE — Telephone Encounter (Signed)
Called, currently not a good time - will try again later.

## 2019-01-04 NOTE — Telephone Encounter (Signed)
Called, no answer.

## 2019-01-05 MED ORDER — DONEPEZIL HCL 10 MG PO TABS: 10.0000 mg | ORAL_TABLET | Freq: Every day | ORAL | 6 refills | Status: DC

## 2019-01-05 NOTE — Addendum Note (Signed)
Addended by: Ria Bush on: 01/05/2019 05:43 PM   Modules accepted: Orders

## 2019-01-05 NOTE — Telephone Encounter (Signed)
Called, spoke with husband who endorses ongoing trouble with memory as well as increasing anxiety due to memory trouble. Will increase aricept to 10mg  daily, if no improvement over next few weeks, return for OV for re evaluation.

## 2019-01-14 ENCOUNTER — Emergency Department (HOSPITAL_COMMUNITY)
Admission: EM | Admit: 2019-01-14 | Discharge: 2019-01-14 | Disposition: A | Discharge status: Home / Self Care | Payer: Medicare Other | Attending: Emergency Medicine | Admitting: Emergency Medicine

## 2019-01-14 ENCOUNTER — Emergency Department (HOSPITAL_COMMUNITY): Payer: Medicare Other

## 2019-01-14 ENCOUNTER — Encounter (HOSPITAL_COMMUNITY): Payer: Self-pay | Admitting: Emergency Medicine

## 2019-01-14 ENCOUNTER — Telehealth: Payer: Self-pay | Admitting: Family Medicine

## 2019-01-14 DIAGNOSIS — K2900 Acute gastritis without bleeding: Secondary | ICD-10-CM | POA: Diagnosis not present

## 2019-01-14 DIAGNOSIS — Z7901 Long term (current) use of anticoagulants: Secondary | ICD-10-CM | POA: Diagnosis not present

## 2019-01-14 DIAGNOSIS — Z79899 Other long term (current) drug therapy: Secondary | ICD-10-CM | POA: Insufficient documentation

## 2019-01-14 DIAGNOSIS — R1011 Right upper quadrant pain: Secondary | ICD-10-CM | POA: Diagnosis not present

## 2019-01-14 DIAGNOSIS — E785 Hyperlipidemia, unspecified: Secondary | ICD-10-CM | POA: Insufficient documentation

## 2019-01-14 DIAGNOSIS — Z86718 Personal history of other venous thrombosis and embolism: Secondary | ICD-10-CM | POA: Insufficient documentation

## 2019-01-14 DIAGNOSIS — R1013 Epigastric pain: Secondary | ICD-10-CM | POA: Diagnosis not present

## 2019-01-14 DIAGNOSIS — K5730 Diverticulosis of large intestine without perforation or abscess without bleeding: Secondary | ICD-10-CM | POA: Diagnosis not present

## 2019-01-14 DIAGNOSIS — R1010 Upper abdominal pain, unspecified: Secondary | ICD-10-CM

## 2019-01-14 LAB — CBC WITH DIFFERENTIAL/PLATELET
Abs Immature Granulocytes: 0.03 10*3/uL (ref 0.00–0.07)
Basophils Absolute: 0.1 10*3/uL (ref 0.0–0.1)
Basophils Relative: 1 %
Eosinophils Absolute: 0 10*3/uL (ref 0.0–0.5)
Eosinophils Relative: 0 %
HCT: 42 % (ref 36.0–46.0)
Hemoglobin: 12.9 g/dL (ref 12.0–15.0)
Immature Granulocytes: 0 %
Lymphocytes Relative: 14 %
Lymphs Abs: 1.2 10*3/uL (ref 0.7–4.0)
MCH: 27.4 pg (ref 26.0–34.0)
MCHC: 30.7 g/dL (ref 30.0–36.0)
MCV: 89.2 fL (ref 80.0–100.0)
Monocytes Absolute: 1 10*3/uL (ref 0.1–1.0)
Monocytes Relative: 11 %
NRBC: 0 % (ref 0.0–0.2)
Neutro Abs: 6.6 10*3/uL (ref 1.7–7.7)
Neutrophils Relative %: 74 %
Platelets: 326 10*3/uL (ref 150–400)
RBC: 4.71 MIL/uL (ref 3.87–5.11)
RDW: 13.4 % (ref 11.5–15.5)
WBC: 8.8 10*3/uL (ref 4.0–10.5)

## 2019-01-14 LAB — LIPASE, BLOOD: Lipase: 82 U/L — ABNORMAL HIGH (ref 11–51)

## 2019-01-14 LAB — COMPREHENSIVE METABOLIC PANEL
ALT: 12 U/L (ref 0–44)
ANION GAP: 11 (ref 5–15)
AST: 21 U/L (ref 15–41)
Albumin: 3.9 g/dL (ref 3.5–5.0)
Alkaline Phosphatase: 58 U/L (ref 38–126)
BUN: 6 mg/dL — ABNORMAL LOW (ref 8–23)
CO2: 28 mmol/L (ref 22–32)
Calcium: 9.8 mg/dL (ref 8.9–10.3)
Chloride: 101 mmol/L (ref 98–111)
Creatinine, Ser: 0.73 mg/dL (ref 0.44–1.00)
GFR calc non Af Amer: >60 mL/min (ref >60)
Glucose, Bld: 101 mg/dL — ABNORMAL HIGH (ref 70–99)
Potassium: 3.8 mmol/L (ref 3.5–5.1)
Sodium: 140 mmol/L (ref 135–145)
TOTAL PROTEIN: 7.1 g/dL (ref 6.5–8.1)
Total Bilirubin: 1.2 mg/dL (ref 0.3–1.2)

## 2019-01-14 MED ORDER — SODIUM CHLORIDE 0.9 % IV BOLUS
500.0000 mL | Freq: Once | INTRAVENOUS | Status: AC
  Administered 2019-01-14: 500 mL via INTRAVENOUS

## 2019-01-14 MED ORDER — OMEPRAZOLE 20 MG PO CPDR: 20.0000 mg | DELAYED_RELEASE_CAPSULE | Freq: Every day | ORAL | 0 refills | Status: DC

## 2019-01-14 MED ORDER — ACETAMINOPHEN 325 MG PO TABS
650.0000 mg | ORAL_TABLET | Freq: Once | ORAL | Status: DC
  Filled 2019-01-14: qty 2

## 2019-01-14 MED ORDER — SODIUM CHLORIDE 0.9 % IV SOLN
INTRAVENOUS | Status: DC
  Administered 2019-01-14: 09:00:00 via INTRAVENOUS

## 2019-01-14 MED ORDER — IOHEXOL 300 MG/ML  SOLN
100.0000 mL | Freq: Once | INTRAMUSCULAR | Status: AC | PRN
  Administered 2019-01-14: 100 mL via INTRAVENOUS

## 2019-01-14 NOTE — ED Notes (Signed)
ED Provider at bedside. 
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
## 2019-01-14 NOTE — Telephone Encounter (Signed)
Reviewed ER records for patient - seen for epigastric abd pain with reassuring evaluation. This could be the higher aricept dose we recently started - if ongoing pain despite omeprazole given in ER, would suggest returning to 5mg  dose if they have lower dose at home.  Has the higher aricept dose help mood/anxiety at all?

## 2019-01-14 NOTE — ED Notes (Signed)
Patient transported to Ultrasound 

## 2019-01-14 NOTE — ED Notes (Signed)
Pt back from scan 

## 2019-01-14 NOTE — ED Notes (Signed)
Patient verbalized understanding of dc instructions, vss, ambulatory with nad.   

## 2019-01-14 NOTE — ED Triage Notes (Addendum)
Pt reports mid abdominal pain that woke her from sleep this am. Pt denies nausea, vomiting or diarrhea but endorses some dizziness. Pt a/ox4, resp e/u, nad.

## 2019-01-14 NOTE — Discharge Instructions (Signed)
Try to eat a bland diet but also you can use Tums or Maalox to coat your stomach before eating while you are waiting for the medicine to really kick in.  If you develop fever, severe pain, vomiting and unable to hold anything down please return

## 2019-01-14 NOTE — ED Notes (Signed)
Patient transported to CT 

## 2019-01-14 NOTE — ED Notes (Signed)
Pt returns from US.

## 2019-01-15 ENCOUNTER — Encounter (HOSPITAL_COMMUNITY): Payer: Self-pay | Admitting: Emergency Medicine

## 2019-01-15 ENCOUNTER — Emergency Department (HOSPITAL_COMMUNITY)
Admission: EM | Admit: 2019-01-15 | Discharge: 2019-01-15 | Disposition: A | Discharge status: Home / Self Care | Payer: Medicare Other | Attending: Emergency Medicine | Admitting: Emergency Medicine

## 2019-01-15 ENCOUNTER — Telehealth: Payer: Self-pay | Admitting: *Deleted

## 2019-01-15 DIAGNOSIS — Z7901 Long term (current) use of anticoagulants: Secondary | ICD-10-CM | POA: Insufficient documentation

## 2019-01-15 DIAGNOSIS — R1013 Epigastric pain: Secondary | ICD-10-CM | POA: Diagnosis not present

## 2019-01-15 DIAGNOSIS — R251 Tremor, unspecified: Secondary | ICD-10-CM | POA: Diagnosis not present

## 2019-01-15 DIAGNOSIS — Z79899 Other long term (current) drug therapy: Secondary | ICD-10-CM | POA: Insufficient documentation

## 2019-01-15 DIAGNOSIS — R42 Dizziness and giddiness: Secondary | ICD-10-CM | POA: Diagnosis not present

## 2019-01-15 LAB — CBC WITH DIFFERENTIAL/PLATELET
ABS IMMATURE GRANULOCYTES: 0.03 10*3/uL (ref 0.00–0.07)
BASOS ABS: 0.1 10*3/uL (ref 0.0–0.1)
Basophils Relative: 1 %
Eosinophils Absolute: 0 10*3/uL (ref 0.0–0.5)
Eosinophils Relative: 0 %
HCT: 45.7 % (ref 36.0–46.0)
Hemoglobin: 14 g/dL (ref 12.0–15.0)
IMMATURE GRANULOCYTES: 0 %
Lymphocytes Relative: 20 %
Lymphs Abs: 1.7 10*3/uL (ref 0.7–4.0)
MCH: 27.6 pg (ref 26.0–34.0)
MCHC: 30.6 g/dL (ref 30.0–36.0)
MCV: 90.1 fL (ref 80.0–100.0)
Monocytes Absolute: 0.9 10*3/uL (ref 0.1–1.0)
Monocytes Relative: 10 %
NRBC: 0 % (ref 0.0–0.2)
Neutro Abs: 5.9 10*3/uL (ref 1.7–7.7)
Neutrophils Relative %: 69 %
Platelets: 357 10*3/uL (ref 150–400)
RBC: 5.07 MIL/uL (ref 3.87–5.11)
RDW: 13.7 % (ref 11.5–15.5)
WBC: 8.6 10*3/uL (ref 4.0–10.5)

## 2019-01-15 LAB — URINALYSIS, ROUTINE W REFLEX MICROSCOPIC
BILIRUBIN URINE: NEGATIVE
Glucose, UA: NEGATIVE mg/dL
Ketones, ur: NEGATIVE mg/dL
Leukocytes, UA: NEGATIVE
NITRITE: NEGATIVE
Protein, ur: NEGATIVE mg/dL
Specific Gravity, Urine: 1.018 (ref 1.005–1.030)
pH: 5 (ref 5.0–8.0)

## 2019-01-15 LAB — COMPREHENSIVE METABOLIC PANEL
ALT: 12 U/L (ref 0–44)
AST: 24 U/L (ref 15–41)
Albumin: 4.3 g/dL (ref 3.5–5.0)
Alkaline Phosphatase: 62 U/L (ref 38–126)
Anion gap: 14 (ref 5–15)
BUN: 5 mg/dL — ABNORMAL LOW (ref 8–23)
CO2: 27 mmol/L (ref 22–32)
Calcium: 9.8 mg/dL (ref 8.9–10.3)
Chloride: 102 mmol/L (ref 98–111)
Creatinine, Ser: 0.73 mg/dL (ref 0.44–1.00)
GFR calc Af Amer: >60 mL/min (ref >60)
GFR calc non Af Amer: >60 mL/min (ref >60)
GLUCOSE: 87 mg/dL (ref 70–99)
Potassium: 3.7 mmol/L (ref 3.5–5.1)
Sodium: 143 mmol/L (ref 135–145)
Total Bilirubin: 1.1 mg/dL (ref 0.3–1.2)
Total Protein: 7.8 g/dL (ref 6.5–8.1)

## 2019-01-15 LAB — LIPASE, BLOOD: Lipase: 37 U/L (ref 11–51)

## 2019-01-15 LAB — TROPONIN I: Troponin I: <0.03 ng/mL (ref <0.03)

## 2019-01-15 MED ORDER — SODIUM CHLORIDE 0.9 % IV BOLUS
1000.0000 mL | Freq: Once | INTRAVENOUS | Status: AC
  Administered 2019-01-15: 1000 mL via INTRAVENOUS

## 2019-01-15 MED ORDER — ONDANSETRON HCL 4 MG/2ML IJ SOLN
4.0000 mg | Freq: Once | INTRAMUSCULAR | Status: AC
  Administered 2019-01-15: 4 mg via INTRAVENOUS
  Filled 2019-01-15: qty 2

## 2019-01-15 NOTE — Telephone Encounter (Signed)
Thanks.  If she needs quick turn around on labs and potentially IV fluids, then ER is reasonable.

## 2019-01-15 NOTE — Discharge Instructions (Addendum)
You were seen in the emergency department for abdominal pain and decreased eating.  Your lab work was unremarkable.  Your symptoms improved after some IV fluids.  It will be important for you to stay well-hydrated and continue your acid medications.  You can also do an antacid between meals and at bedtime.  Please follow-up with your doctor and discuss with them possible referral for gastroenterology.  Return if any concerns.

## 2019-01-15 NOTE — Telephone Encounter (Signed)
Attempted to contact pt's husband, [name] (on dpr). No answer.  No vm.   Need to relay Dr. Synthia Innocent message.

## 2019-01-15 NOTE — ED Notes (Signed)
Patient verbalizes understanding of discharge instructions. Opportunity for questioning and answers were provided. Armband removed by staff, pt discharged from ED. Pt taken home by family

## 2019-01-15 NOTE — ED Notes (Signed)
Pt able to drink fluids, no complaints.

## 2019-01-15 NOTE — ED Notes (Signed)
Woke up dizzy nauseated   And trembling

## 2019-01-15 NOTE — ED Notes (Signed)
Patient provided water and crackers for fluid challenge.

## 2019-01-15 NOTE — Telephone Encounter (Signed)
Patient's husband called stating he had to take her to the ED yesterday because of dizziness and stomach pain. Mr. Boschert stated that they gave her IV fluids and thinks that she may have an ulcer. Mr. Yinger stated that she was doing better last night after the IV fluids. Mr. Lichter stated that his wife woke up this morning with dizziness, stomach pain and nauseated.   After speaking with Dr. Damita Dunnings advised Mr. Trillo that he should take her back to the ED because she may need some additional IV fluids and blood work to make sure that nothing has changed. Mr. Kasprzak verbalized understanding and stated that he will talk with his wife about this and if she agrees he will take her back to [facility] ED. Advised Mr. Capwell to call and let us know if there is anything else that we can do for her.

## 2019-01-15 NOTE — ED Triage Notes (Signed)
Pt arrives from home with c/o of lower abd pain- pt was seen here yesterday for same had blood work and [place] per pt went home feeling better but woke up in same pain.

## 2019-01-15 NOTE — ED Notes (Signed)
ED Provider at bedside. 
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
## 2019-01-16 ENCOUNTER — Telehealth: Payer: Self-pay | Admitting: *Deleted

## 2019-01-16 LAB — URINE CULTURE: Culture: <10000 — AB

## 2019-01-16 NOTE — Telephone Encounter (Signed)
Spoke with pt's husband, [name] (on dpr), relaying Dr. Synthia Innocent message.  States they have not started hight Aricept dose yet.  However, pt is feeling a little better today with the omeprazole. Says she still is not eating much (some applesauce and fruit) and says the mention of food makes her nausea.  Fyi to Dr. Darnell Level, pt was seen again at [facility] ED yesterday. [See TE, 01/15/18.]

## 2019-01-16 NOTE — Telephone Encounter (Signed)
Called patient to follow-up on her recent ER visits. Patient stated that she is feeling some better today. Appointment scheduled with Dr. Danise Mina 01/18/19/ at 10:15.

## 2019-01-18 ENCOUNTER — Ambulatory Visit: Payer: Medicare Other | Admitting: Family Medicine

## 2019-01-18 ENCOUNTER — Encounter: Payer: Self-pay | Admitting: Family Medicine

## 2019-01-18 ENCOUNTER — Ambulatory Visit (INDEPENDENT_AMBULATORY_CARE_PROVIDER_SITE_OTHER): Payer: Medicare Other | Admitting: Family Medicine

## 2019-01-18 VITALS — BP 120/82 | HR 106 | Temp 97.9°F | Ht 62.0 in | Wt 93.4 lb

## 2019-01-18 DIAGNOSIS — G3184 Mild cognitive impairment, so stated: Secondary | ICD-10-CM

## 2019-01-18 DIAGNOSIS — F411 Generalized anxiety disorder: Secondary | ICD-10-CM

## 2019-01-18 DIAGNOSIS — R1013 Epigastric pain: Secondary | ICD-10-CM | POA: Diagnosis not present

## 2019-01-18 MED ORDER — LORAZEPAM 0.5 MG PO TABS: 0.5000 mg | ORAL_TABLET | Freq: Every day | ORAL | 0 refills | Status: DC | PRN

## 2019-01-18 MED ORDER — MEMANTINE HCL 5 MG PO TABS: ORAL_TABLET | ORAL | 1 refills | Status: DC

## 2019-01-18 NOTE — Progress Notes (Signed)
BP 120/82 (BP Location: Left Arm, Patient Position: Sitting, Cuff Size: Normal)   Pulse (!) 106   Temp 97.9 F (36.6 C) (Oral)   Ht 5\' 2"  (1.575 m)   Wt 93 lb 6 oz (42.4 kg)   SpO2 95%   BMI 17.08 kg/m    CC: ER f/u visit Subjective:    Patient ID: Kimberly Davies, female    DOB: 1941/11/22, 78 y.o.   MRN: [id number]  HPI: Kimberly Davies is a 78 y.o. female presenting on 01/18/2019 for Hospitalization Follow-up (Seen at [facility] ED on 01/15/19. Pt accompanied by her husband, [name]. )   Seen at ER twice last week with epigastric and RUQ abdominal pain along with dizziness nausea and weakness. Records reviewed. Workup largely unrevealing (RUQ abd [place] and CT scan - diverticulosis without diverticulitis, benign appearing L ovarian cyst, lumbar DDD and aortic ATH). CBC, CMP, lipase stable on repeat. Started on omeprazole 20mg  daily.   UA showed moderate blood, but micro with 0-5 RBC.   Since home feels mediocre - ongoing GI upset, decreased appetite.  No blood in urine, urgency, dysuria, frequency.   Recent dx dementia - started on aricept 12/21/2018. MMSE 12/2018 23/30. They never increased aricept to 10mg  dose.   Husband worried about pt's anxiety level. Trouble with sleep due to worrying about upcoming appointments. Valium for MRI actually helped symptoms as well.   Recurrent LLE DVT 11/06/2019 on lifelong eliquis, saw heme rec 3 months of 5mg  bid then may decrease to 2.5mg  bid for maintenance.      Relevant past medical, surgical, family and social history reviewed and updated as indicated. Interim medical history since our last visit reviewed. Allergies and medications reviewed and updated. Outpatient Medications Prior to Visit  Medication Sig Dispense Refill  . apixaban (ELIQUIS) 5 MG TABS tablet Take 1 tablet (5 mg total) by mouth 2 (two) times daily. 60 tablet 3  . Cholecalciferol (VITAMIN D3) 1.25 MG (50000 UT) TABS Take 1 tablet by mouth once a week. 12 tablet 1  . diazepam (VALIUM)  2 MG tablet Take 0.5-1 tablets (1-2 mg total) by mouth 2 (two) times daily as needed for anxiety (claustrophobia for upcoming MRI). 2 tablet 0  . omeprazole (PRILOSEC) 20 MG capsule Take 1 capsule (20 mg total) by mouth daily. 14 capsule 0  . vitamin B-12 (CYANOCOBALAMIN) 1000 MCG tablet Take 1 tablet (1,000 mcg total) by mouth daily.    Marland Kitchen donepezil (ARICEPT) 10 MG tablet Take 1 tablet (10 mg total) by mouth at bedtime. 30 tablet 6   No facility-administered medications prior to visit.      Per HPI unless specifically indicated in ROS section below Review of Systems Objective:    BP 120/82 (BP Location: Left Arm, Patient Position: Sitting, Cuff Size: Normal)   Pulse (!) 106   Temp 97.9 F (36.6 C) (Oral)   Ht 5\' 2"  (1.575 m)   Wt 93 lb 6 oz (42.4 kg)   SpO2 95%   BMI 17.08 kg/m   Wt Readings from Last 3 Encounters:  01/18/19 93 lb 6 oz (42.4 kg)  12/18/18 97 lb 4 oz (44.1 kg)  11/20/18 95 lb 12 oz (43.4 kg)    Physical Exam Vitals signs and nursing note reviewed.  Constitutional:      General: She is not in acute distress.    Appearance: Normal appearance.  HENT:     Mouth/Throat:     Mouth: Mucous membranes are moist.  Pharynx: Oropharynx is clear.  Cardiovascular:     Rate and Rhythm: Normal rate and regular rhythm.     Pulses: Normal pulses.     Heart sounds: Normal heart sounds. No murmur.  Pulmonary:     Effort: Pulmonary effort is normal. No respiratory distress.     Breath sounds: Normal breath sounds. No wheezing, rhonchi or rales.  Abdominal:     General: Abdomen is flat. Bowel sounds are normal. There is no distension.     Palpations: Abdomen is soft. There is no mass.     Tenderness: There is abdominal tenderness (mild) in the right upper quadrant and epigastric area. There is no guarding or rebound.     Hernia: No hernia is present.  Musculoskeletal:     Right lower leg: No edema.     Left lower leg: No edema.     Comments: Compression stockings in  place  Skin:    Findings: No rash.  Neurological:     Mental Status: She is alert.  Psychiatric:        Mood and Affect: Mood normal.       Results for orders placed or performed during the hospital encounter of 01/15/19  Urine culture  Result Value Ref Range   Specimen Description URINE, CLEAN CATCH    Special Requests NONE    Culture (A)     <10,000 COLONIES/mL INSIGNIFICANT GROWTH Performed at [facility] [address], [place], [place] [address]    Report Status 01/16/2019 FINAL   Comprehensive metabolic panel  Result Value Ref Range   Sodium 143 135 - 145 mmol/L   Potassium 3.7 3.5 - 5.1 mmol/L   Chloride 102 98 - 111 mmol/L   CO2 27 22 - 32 mmol/L   Glucose, Bld 87 70 - 99 mg/dL   BUN 5 (L) 8 - 23 mg/dL   Creatinine, Ser 0.73 0.44 - 1.00 mg/dL   Calcium 9.8 8.9 - 10.3 mg/dL   Total Protein 7.8 6.5 - 8.1 g/dL   Albumin 4.3 3.5 - 5.0 g/dL   AST 24 15 - 41 U/L   ALT 12 0 - 44 U/L   Alkaline Phosphatase 62 38 - 126 U/L   Total Bilirubin 1.1 0.3 - 1.2 mg/dL   GFR calc non Af Amer >60 >60 mL/min   GFR calc Af Amer >60 >60 mL/min   Anion gap 14 5 - 15  Lipase, blood  Result Value Ref Range   Lipase 37 11 - 51 U/L  Troponin I - Once  Result Value Ref Range   Troponin I <0.03 <0.03 ng/mL  CBC with Differential  Result Value Ref Range   WBC 8.6 4.0 - 10.5 K/uL   RBC 5.07 3.87 - 5.11 MIL/uL   Hemoglobin 14.0 12.0 - 15.0 g/dL   HCT 45.7 36.0 - 46.0 %   MCV 90.1 80.0 - 100.0 fL   MCH 27.6 26.0 - 34.0 pg   MCHC 30.6 30.0 - 36.0 g/dL   RDW 13.7 11.5 - 15.5 %   Platelets 357 150 - 400 K/uL   nRBC 0.0 0.0 - 0.2 %   Neutrophils Relative % 69 %   Neutro Abs 5.9 1.7 - 7.7 K/uL   Lymphocytes Relative 20 %   Lymphs Abs 1.7 0.7 - 4.0 K/uL   Monocytes Relative 10 %   Monocytes Absolute 0.9 0.1 - 1.0 K/uL   Eosinophils Relative 0 %   Eosinophils Absolute 0.0 0.0 - 0.5 K/uL  Basophils Relative 1 %   Basophils Absolute 0.1 0.0 - 0.1 K/uL   Immature Granulocytes  0 %   Abs Immature Granulocytes 0.03 0.00 - 0.07 K/uL  Urinalysis, Routine w reflex microscopic  Result Value Ref Range   Color, Urine YELLOW YELLOW   APPearance CLEAR CLEAR   Specific Gravity, Urine 1.018 1.005 - 1.030   pH 5.0 5.0 - 8.0   Glucose, UA NEGATIVE NEGATIVE mg/dL   Hgb urine dipstick MODERATE (A) NEGATIVE   Bilirubin Urine NEGATIVE NEGATIVE   Ketones, ur NEGATIVE NEGATIVE mg/dL   Protein, ur NEGATIVE NEGATIVE mg/dL   Nitrite NEGATIVE NEGATIVE   Leukocytes, UA NEGATIVE NEGATIVE   RBC / HPF 0-5 0 - 5 RBC/hpf   WBC, UA 0-5 0 - 5 WBC/hpf   Bacteria, UA RARE (A) NONE SEEN   Squamous Epithelial / LPF 0-5 0 - 5   Mucus PRESENT    Assessment & Plan:   Problem List Items Addressed This Visit    MCI (mild cognitive impairment) with memory loss    Stop aricept. Start namenda 5mg  daily x 1 wk then increase to 5mg  bid. RTC 1 mo f/u visit. Pt and husband agree with plan.       Epigastric pain - Primary    Reassuring ER workup. Anticipate aricept (started last month) led to GI distress and nausea with ER presentation with case of dehydration. rec stop aricept, continue 2 full weeks of PPI. See above for further plan.       Anxiety state    Husband notices worsening anxiety related to memory impairment. Will Rx ativan sparingly PRN, discussed risks of med including habit forming nature, dependence/tolerance. If ongoing anxiety trouble next visit, will discuss starting SSRI (want to do 1 med change at a time however).       Relevant Medications   LORazepam (ATIVAN) 0.5 MG tablet       Meds ordered this encounter  Medications  . memantine (NAMENDA) 5 MG tablet    Sig: Take 1 tablet (5 mg total) by mouth daily for 7 days, THEN 1 tablet (5 mg total) 2 (two) times daily for 30 days.    Dispense:  60 tablet    Refill:  1    In place of aricept  . LORazepam (ATIVAN) 0.5 MG tablet    Sig: Take 1 tablet (0.5 mg total) by mouth daily as needed for anxiety or sleep.    Dispense:   20 tablet    Refill:  0   No orders of the defined types were placed in this encounter.   Follow up plan: Return in about 4 weeks (around 02/15/2019) for follow up visit.  Ria Bush, MD

## 2019-01-18 NOTE — Patient Instructions (Addendum)
Stop aricept.  Start namenda 5mg  daily for 1 week then increase to twice daily.  May take ativan 0.5mg  tablet once daily as needed for stress or anxiety or sleep.  Return in 1 month for follow up visit.

## 2019-01-18 NOTE — Assessment & Plan Note (Signed)
Husband notices worsening anxiety related to memory impairment. Will Rx ativan sparingly PRN, discussed risks of med including habit forming nature, dependence/tolerance. If ongoing anxiety trouble next visit, will discuss starting SSRI (want to do 1 med change at a time however).

## 2019-01-18 NOTE — Assessment & Plan Note (Signed)
Reassuring ER workup. Anticipate aricept (started last month) led to GI distress and nausea with ER presentation with case of dehydration. rec stop aricept, continue 2 full weeks of PPI. See above for further plan.

## 2019-01-18 NOTE — Assessment & Plan Note (Signed)
Stop aricept. Start namenda 5mg  daily x 1 wk then increase to 5mg  bid. RTC 1 mo f/u visit. Pt and husband agree with plan.

## 2019-01-29 ENCOUNTER — Inpatient Hospital Stay: Payer: Medicare Other | Admitting: Oncology

## 2019-01-29 ENCOUNTER — Inpatient Hospital Stay: Payer: Medicare Other

## 2019-01-31 ENCOUNTER — Other Ambulatory Visit: Payer: Self-pay

## 2019-02-01 ENCOUNTER — Inpatient Hospital Stay: Payer: Medicare Other | Attending: Oncology

## 2019-02-01 ENCOUNTER — Inpatient Hospital Stay (HOSPITAL_BASED_OUTPATIENT_CLINIC_OR_DEPARTMENT_OTHER): Payer: Medicare Other | Admitting: Oncology

## 2019-02-01 ENCOUNTER — Other Ambulatory Visit: Payer: Self-pay

## 2019-02-01 ENCOUNTER — Encounter: Payer: Self-pay | Admitting: Oncology

## 2019-02-01 VITALS — BP 126/73 | HR 76 | Temp 96.7°F | Resp 18 | Wt 95.8 lb

## 2019-02-01 DIAGNOSIS — R200 Anesthesia of skin: Secondary | ICD-10-CM | POA: Insufficient documentation

## 2019-02-01 DIAGNOSIS — Z8249 Family history of ischemic heart disease and other diseases of the circulatory system: Secondary | ICD-10-CM | POA: Diagnosis not present

## 2019-02-01 DIAGNOSIS — Z86718 Personal history of other venous thrombosis and embolism: Secondary | ICD-10-CM | POA: Insufficient documentation

## 2019-02-01 DIAGNOSIS — Z79899 Other long term (current) drug therapy: Secondary | ICD-10-CM | POA: Diagnosis not present

## 2019-02-01 DIAGNOSIS — Z7901 Long term (current) use of anticoagulants: Secondary | ICD-10-CM | POA: Diagnosis not present

## 2019-02-01 DIAGNOSIS — I8390 Asymptomatic varicose veins of unspecified lower extremity: Secondary | ICD-10-CM

## 2019-02-01 DIAGNOSIS — I82402 Acute embolism and thrombosis of unspecified deep veins of left lower extremity: Secondary | ICD-10-CM

## 2019-02-01 DIAGNOSIS — R7989 Other specified abnormal findings of blood chemistry: Secondary | ICD-10-CM

## 2019-02-01 LAB — CBC WITH DIFFERENTIAL/PLATELET
Abs Immature Granulocytes: 0.03 10*3/uL (ref 0.00–0.07)
Basophils Absolute: 0.1 10*3/uL (ref 0.0–0.1)
Basophils Relative: 1 %
Eosinophils Absolute: 0.1 10*3/uL (ref 0.0–0.5)
Eosinophils Relative: 1 %
HCT: 39.5 % (ref 36.0–46.0)
Hemoglobin: 12.4 g/dL (ref 12.0–15.0)
Immature Granulocytes: 1 %
LYMPHS ABS: 1.7 10*3/uL (ref 0.7–4.0)
Lymphocytes Relative: 26 %
MCH: 28.2 pg (ref 26.0–34.0)
MCHC: 31.4 g/dL (ref 30.0–36.0)
MCV: 89.8 fL (ref 80.0–100.0)
MONO ABS: 0.7 10*3/uL (ref 0.1–1.0)
Monocytes Relative: 10 %
Neutro Abs: 4.1 10*3/uL (ref 1.7–7.7)
Neutrophils Relative %: 61 %
Platelets: 447 10*3/uL — ABNORMAL HIGH (ref 150–400)
RBC: 4.4 MIL/uL (ref 3.87–5.11)
RDW: 13.6 % (ref 11.5–15.5)
WBC: 6.6 10*3/uL (ref 4.0–10.5)
nRBC: 0 % (ref 0.0–0.2)

## 2019-02-01 LAB — COMPREHENSIVE METABOLIC PANEL
ALT: 22 U/L (ref 0–44)
AST: 34 U/L (ref 15–41)
Albumin: 4.2 g/dL (ref 3.5–5.0)
Alkaline Phosphatase: 79 U/L (ref 38–126)
Anion gap: 7 (ref 5–15)
BUN: 13 mg/dL (ref 8–23)
CO2: 31 mmol/L (ref 22–32)
Calcium: 9.9 mg/dL (ref 8.9–10.3)
Chloride: 102 mmol/L (ref 98–111)
Creatinine, Ser: 0.73 mg/dL (ref 0.44–1.00)
GFR calc Af Amer: >60 mL/min (ref >60)
GFR calc non Af Amer: >60 mL/min (ref >60)
Glucose, Bld: 96 mg/dL (ref 70–99)
Potassium: 3.7 mmol/L (ref 3.5–5.1)
SODIUM: 140 mmol/L (ref 135–145)
Total Bilirubin: 0.6 mg/dL (ref 0.3–1.2)
Total Protein: 8.2 g/dL — ABNORMAL HIGH (ref 6.5–8.1)
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
## 2019-02-01 NOTE — Progress Notes (Signed)
Patient here for follow up. She complains of pain/burning sensation to both calves.

## 2019-02-06 LAB — PROTHROMBIN GENE MUTATION

## 2019-02-21 ENCOUNTER — Telehealth: Payer: Self-pay | Admitting: Family Medicine

## 2019-02-21 ENCOUNTER — Encounter: Payer: Self-pay | Admitting: Family Medicine

## 2019-02-21 ENCOUNTER — Other Ambulatory Visit: Payer: Self-pay

## 2019-02-21 ENCOUNTER — Ambulatory Visit (INDEPENDENT_AMBULATORY_CARE_PROVIDER_SITE_OTHER): Payer: Medicare Other | Admitting: Family Medicine

## 2019-02-21 VITALS — BP 116/80 | HR 83 | Temp 97.6°F | Ht 62.0 in | Wt 100.5 lb

## 2019-02-21 DIAGNOSIS — I82402 Acute embolism and thrombosis of unspecified deep veins of left lower extremity: Secondary | ICD-10-CM | POA: Diagnosis not present

## 2019-02-21 DIAGNOSIS — G3184 Mild cognitive impairment, so stated: Secondary | ICD-10-CM

## 2019-02-21 DIAGNOSIS — R1013 Epigastric pain: Secondary | ICD-10-CM | POA: Diagnosis not present

## 2019-02-21 DIAGNOSIS — F411 Generalized anxiety disorder: Secondary | ICD-10-CM

## 2019-02-21 MED ORDER — MEMANTINE HCL 5 MG PO TABS: ORAL_TABLET | ORAL | 1 refills | Status: DC

## 2019-02-21 MED ORDER — MEMANTINE HCL 5 MG PO TABS: 5.0000 mg | ORAL_TABLET | Freq: Two times a day (BID) | ORAL | 6 refills | Status: DC

## 2019-02-21 NOTE — Patient Instructions (Signed)
You are doing well today continue current medicines See you at your physical.

## 2019-02-21 NOTE — Telephone Encounter (Signed)
Spoke with pt's husband, [name] (on dpr), relaying Dr. Synthia Innocent instructions and message.  Verbalizes understanding.

## 2019-02-21 NOTE — Assessment & Plan Note (Signed)
Stable period, lorazepam wasn't very effective. Will watch for now.

## 2019-02-21 NOTE — Telephone Encounter (Signed)
Patient's husband,Crane,called.  Patient was seen this morning.  Patient's on a memory medication and was asked how it was doing.  Patient she said it was fine.  Patient's husband said she can't remember things within 45 minutes.  Patient called this morning to check on her appointment and forgot she had an appointment 45 minutes later and forgot she called about appointment.  Dr.G said he could increase the dosage and patient's husband said the dosage she's taking now isn't really helping. Can the dosage be increased?  Patient uses [facility] [place].

## 2019-02-21 NOTE — Assessment & Plan Note (Signed)
Completed PPI course, this has resolved off aricept.

## 2019-02-21 NOTE — Assessment & Plan Note (Signed)
Appreciate heme care. Plan to decrease to 2.5mg  maintenance dosing 04/2019

## 2019-02-21 NOTE — Assessment & Plan Note (Signed)
Doing well on current namenda dosing of 5mg  bid. Continue.

## 2019-02-21 NOTE — Telephone Encounter (Signed)
Ok to increase dose - start at 5mg  in am and 10mg  in pm for 1 week then increase to 10mg  BID. New dose sent to pharmacy.  We can do 5mg  dose for first month then may increase to 10mg  tablets

## 2019-02-21 NOTE — Progress Notes (Signed)
BP 116/80 (BP Location: Left Arm, Patient Position: Sitting, Cuff Size: Normal)   Pulse 83   Temp 97.6 F (36.4 C) (Oral)   Ht 5\' 2"  (1.575 m)   Wt 100 lb 8 oz (45.6 kg)   SpO2 99%   BMI 18.38 kg/m    CC: 1 mo f/u visit Subjective:    Patient ID: Kimberly Davies, female    DOB: 1941/04/24, 78 y.o.   MRN: [id number]  HPI: Kimberly Davies is a 78 y.o. female presenting on 02/21/2019 for Follow-up (Here for 1 mo f/u. Pt accompanied by her husband, [name].)   See prior note for details.  Recent dementia diagnosis, aricept started 12/21/2018. MMSE 23/30 (12/2018). Increased anxiety around dementia diagnosis. Aricept was stopped last month after she had ER visit for epigastric pain. Instead, we started namenda 5mg  bid. Tolerating this medication well. We also prescribed PRN ativan for sparing use around anxiety - they don't feel this has helped. She completed 2 weeks of PPI therapy for epigastric pain without recurrence. Sleeping well.   Since last seen, saw hematology Dr Tasia Catchings for recurrent LLE DVT (10/2018) on chronic eliquis 5mg  bid. Plan to finish 6 months of eliquis 5mg  bid then transition to maintenance dosing of 2.5mg  bid. Denies leg pain or swelling. She does regularly wear compression stockings and elevates legs.   Breakfast is applesauce, cinnamon toast, and boost, and cup of coffee.      Relevant past medical, surgical, family and social history reviewed and updated as indicated. Interim medical history since our last visit reviewed. Allergies and medications reviewed and updated. Outpatient Medications Prior to Visit  Medication Sig Dispense Refill  . apixaban (ELIQUIS) 5 MG TABS tablet Take 1 tablet (5 mg total) by mouth 2 (two) times daily. 60 tablet 3  . Cholecalciferol (VITAMIN D3) 1.25 MG (50000 UT) TABS Take 1 tablet by mouth once a week. 12 tablet 1  . LORazepam (ATIVAN) 0.5 MG tablet Take 1 tablet (0.5 mg total) by mouth daily as needed for anxiety or sleep. 20 tablet 0  .  vitamin B-12 (CYANOCOBALAMIN) 1000 MCG tablet Take 1 tablet (1,000 mcg total) by mouth daily.    . memantine (NAMENDA) 5 MG tablet Take 1 tablet (5 mg total) by mouth daily for 7 days, THEN 1 tablet (5 mg total) 2 (two) times daily for 30 days. 60 tablet 1  . diazepam (VALIUM) 2 MG tablet Take 0.5-1 tablets (1-2 mg total) by mouth 2 (two) times daily as needed for anxiety (claustrophobia for upcoming MRI). (Patient not taking: Reported on 02/01/2019) 2 tablet 0  . omeprazole (PRILOSEC) 20 MG capsule Take 1 capsule (20 mg total) by mouth daily. 14 capsule 0   No facility-administered medications prior to visit.      Per HPI unless specifically indicated in ROS section below Review of Systems Objective:    BP 116/80 (BP Location: Left Arm, Patient Position: Sitting, Cuff Size: Normal)   Pulse 83   Temp 97.6 F (36.4 C) (Oral)   Ht 5\' 2"  (1.575 m)   Wt 100 lb 8 oz (45.6 kg)   SpO2 99%   BMI 18.38 kg/m   Wt Readings from Last 3 Encounters:  02/21/19 100 lb 8 oz (45.6 kg)  02/01/19 95 lb 12.8 oz (43.5 kg)  01/18/19 93 lb 6 oz (42.4 kg)    Physical Exam Vitals signs and nursing note reviewed.  Constitutional:      Appearance: Normal appearance. She is not ill-appearing.  HENT:     Head: Normocephalic and atraumatic.     Nose: Nose normal. No congestion.     Mouth/Throat:     Mouth: Mucous membranes are moist.  Eyes:     Extraocular Movements: Extraocular movements intact.     Pupils: Pupils are equal, round, and reactive to light.  Cardiovascular:     Rate and Rhythm: Normal rate and regular rhythm.     Pulses: Normal pulses.     Heart sounds: Normal heart sounds. No murmur.  Pulmonary:     Effort: Pulmonary effort is normal. No respiratory distress.     Breath sounds: Normal breath sounds. No wheezing, rhonchi or rales.  Musculoskeletal:     Right lower leg: No edema.     Left lower leg: No edema.     Comments: Compression stockings in place  Skin:    General: Skin is warm  and dry.     Findings: No rash.  Neurological:     Mental Status: She is alert.  Psychiatric:        Mood and Affect: Mood normal.        Behavior: Behavior normal.       Results for orders placed or performed in visit on 02/01/19  Prothrombin gene mutation  Result Value Ref Range   Recommendations-PTGENE: Comment   Comprehensive metabolic panel  Result Value Ref Range   Sodium 140 135 - 145 mmol/L   Potassium 3.7 3.5 - 5.1 mmol/L   Chloride 102 98 - 111 mmol/L   CO2 31 22 - 32 mmol/L   Glucose, Bld 96 70 - 99 mg/dL   BUN 13 8 - 23 mg/dL   Creatinine, Ser 0.73 0.44 - 1.00 mg/dL   Calcium 9.9 8.9 - 10.3 mg/dL   Total Protein 8.2 (H) 6.5 - 8.1 g/dL   Albumin 4.2 3.5 - 5.0 g/dL   AST 34 15 - 41 U/L   ALT 22 0 - 44 U/L   Alkaline Phosphatase 79 38 - 126 U/L   Total Bilirubin 0.6 0.3 - 1.2 mg/dL   GFR calc non Af Amer >60 >60 mL/min   GFR calc Af Amer >60 >60 mL/min   Anion gap 7 5 - 15  CBC with Differential/Platelet  Result Value Ref Range   WBC 6.6 4.0 - 10.5 K/uL   RBC 4.40 3.87 - 5.11 MIL/uL   Hemoglobin 12.4 12.0 - 15.0 g/dL   HCT 39.5 36.0 - 46.0 %   MCV 89.8 80.0 - 100.0 fL   MCH 28.2 26.0 - 34.0 pg   MCHC 31.4 30.0 - 36.0 g/dL   RDW 13.6 11.5 - 15.5 %   Platelets 447 (H) 150 - 400 K/uL   nRBC 0.0 0.0 - 0.2 %   Neutrophils Relative % 61 %   Neutro Abs 4.1 1.7 - 7.7 K/uL   Lymphocytes Relative 26 %   Lymphs Abs 1.7 0.7 - 4.0 K/uL   Monocytes Relative 10 %   Monocytes Absolute 0.7 0.1 - 1.0 K/uL   Eosinophils Relative 1 %   Eosinophils Absolute 0.1 0.0 - 0.5 K/uL   Basophils Relative 1 %   Basophils Absolute 0.1 0.0 - 0.1 K/uL   Immature Granulocytes 1 %   Abs Immature Granulocytes 0.03 0.00 - 0.07 K/uL   Assessment & Plan:   Problem List Items Addressed This Visit    Recurrent acute deep vein thrombosis (DVT) of left lower extremity ([facility])    Appreciate heme care. Plan to  decrease to 2.5mg  maintenance dosing 04/2019      MCI (mild cognitive impairment)  with memory loss - Primary    Doing well on current namenda dosing of 5mg  bid. Continue.       RESOLVED: Epigastric pain    Completed PPI course, this has resolved off aricept.       Anxiety state    Stable period, lorazepam wasn't very effective. Will watch for now.           Meds ordered this encounter  Medications  . memantine (NAMENDA) 5 MG tablet    Sig: Take 1 tablet (5 mg total) by mouth 2 (two) times daily for 30 days.    Dispense:  60 tablet    Refill:  6   No orders of the defined types were placed in this encounter.   Follow up plan: No follow-ups on file.  Ria Bush, MD

## 2019-03-03 ENCOUNTER — Other Ambulatory Visit: Payer: Self-pay | Admitting: Family Medicine

## 2019-04-26 ENCOUNTER — Other Ambulatory Visit: Payer: Self-pay

## 2019-04-27 ENCOUNTER — Inpatient Hospital Stay: Payer: Medicare Other | Attending: Oncology

## 2019-04-27 ENCOUNTER — Other Ambulatory Visit: Payer: Self-pay

## 2019-04-27 DIAGNOSIS — Z7901 Long term (current) use of anticoagulants: Secondary | ICD-10-CM | POA: Diagnosis not present

## 2019-04-27 DIAGNOSIS — Z801 Family history of malignant neoplasm of trachea, bronchus and lung: Secondary | ICD-10-CM | POA: Diagnosis not present

## 2019-04-27 DIAGNOSIS — Z79899 Other long term (current) drug therapy: Secondary | ICD-10-CM | POA: Insufficient documentation

## 2019-04-27 DIAGNOSIS — Z86718 Personal history of other venous thrombosis and embolism: Secondary | ICD-10-CM | POA: Diagnosis not present

## 2019-04-27 DIAGNOSIS — I82402 Acute embolism and thrombosis of unspecified deep veins of left lower extremity: Secondary | ICD-10-CM

## 2019-04-27 DIAGNOSIS — Z8249 Family history of ischemic heart disease and other diseases of the circulatory system: Secondary | ICD-10-CM | POA: Diagnosis not present

## 2019-04-27 LAB — CBC WITH DIFFERENTIAL/PLATELET
Abs Immature Granulocytes: 0.03 10*3/uL (ref 0.00–0.07)
Basophils Absolute: 0 10*3/uL (ref 0.0–0.1)
Basophils Relative: 0 %
Eosinophils Absolute: 0.1 10*3/uL (ref 0.0–0.5)
Eosinophils Relative: 1 %
HCT: 37.8 % (ref 36.0–46.0)
Hemoglobin: 12.1 g/dL (ref 12.0–15.0)
Immature Granulocytes: 0 %
Lymphocytes Relative: 32 %
Lymphs Abs: 2.5 10*3/uL (ref 0.7–4.0)
MCH: 28.6 pg (ref 26.0–34.0)
MCHC: 32 g/dL (ref 30.0–36.0)
MCV: 89.4 fL (ref 80.0–100.0)
Monocytes Absolute: 0.8 10*3/uL (ref 0.1–1.0)
Monocytes Relative: 11 %
Neutro Abs: 4.3 10*3/uL (ref 1.7–7.7)
Neutrophils Relative %: 56 %
Platelets: 347 10*3/uL (ref 150–400)
RBC: 4.23 MIL/uL (ref 3.87–5.11)
RDW: 13.4 % (ref 11.5–15.5)
WBC: 7.8 10*3/uL (ref 4.0–10.5)
nRBC: 0 % (ref 0.0–0.2)

## 2019-04-27 LAB — COMPREHENSIVE METABOLIC PANEL
ALT: 24 U/L (ref 0–44)
AST: 30 U/L (ref 15–41)
Albumin: 4.4 g/dL (ref 3.5–5.0)
Alkaline Phosphatase: 70 U/L (ref 38–126)
Anion gap: 7 (ref 5–15)
BUN: 15 mg/dL (ref 8–23)
CO2: 29 mmol/L (ref 22–32)
Calcium: 9.7 mg/dL (ref 8.9–10.3)
Chloride: 105 mmol/L (ref 98–111)
Creatinine, Ser: 0.71 mg/dL (ref 0.44–1.00)
GFR calc Af Amer: >60 mL/min (ref >60)
GFR calc non Af Amer: >60 mL/min (ref >60)
Glucose, Bld: 93 mg/dL (ref 70–99)
Potassium: 3.8 mmol/L (ref 3.5–5.1)
Sodium: 141 mmol/L (ref 135–145)
Total Bilirubin: 0.7 mg/dL (ref 0.3–1.2)
Total Protein: 7.9 g/dL (ref 6.5–8.1)

## 2019-05-01 ENCOUNTER — Other Ambulatory Visit: Payer: Medicare Other

## 2019-05-01 ENCOUNTER — Other Ambulatory Visit: Payer: Self-pay

## 2019-05-01 ENCOUNTER — Encounter: Payer: Self-pay | Admitting: Oncology

## 2019-05-01 ENCOUNTER — Inpatient Hospital Stay (HOSPITAL_BASED_OUTPATIENT_CLINIC_OR_DEPARTMENT_OTHER): Payer: Medicare Other | Admitting: Oncology

## 2019-05-01 DIAGNOSIS — I82402 Acute embolism and thrombosis of unspecified deep veins of left lower extremity: Secondary | ICD-10-CM

## 2019-05-01 DIAGNOSIS — Z7901 Long term (current) use of anticoagulants: Secondary | ICD-10-CM

## 2019-05-01 MED ORDER — APIXABAN 2.5 MG PO TABS: 2.5000 mg | ORAL_TABLET | Freq: Two times a day (BID) | ORAL | 3 refills | Status: DC

## 2019-05-01 NOTE — Progress Notes (Signed)
Called patient for Televisit via WebEx.  Patient states no new concerns today.

## 2019-05-03 ENCOUNTER — Other Ambulatory Visit: Payer: Self-pay

## 2019-05-03 ENCOUNTER — Other Ambulatory Visit: Payer: Self-pay | Admitting: Family Medicine

## 2019-05-03 ENCOUNTER — Ambulatory Visit (INDEPENDENT_AMBULATORY_CARE_PROVIDER_SITE_OTHER): Payer: Medicare Other

## 2019-05-03 ENCOUNTER — Other Ambulatory Visit (INDEPENDENT_AMBULATORY_CARE_PROVIDER_SITE_OTHER): Payer: Medicare Other

## 2019-05-03 DIAGNOSIS — E538 Deficiency of other specified B group vitamins: Secondary | ICD-10-CM

## 2019-05-03 DIAGNOSIS — E559 Vitamin D deficiency, unspecified: Secondary | ICD-10-CM | POA: Diagnosis not present

## 2019-05-03 DIAGNOSIS — E7849 Other hyperlipidemia: Secondary | ICD-10-CM

## 2019-05-03 DIAGNOSIS — Z0000 Encounter for general adult medical examination without abnormal findings: Secondary | ICD-10-CM

## 2019-05-03 DIAGNOSIS — I82402 Acute embolism and thrombosis of unspecified deep veins of left lower extremity: Secondary | ICD-10-CM

## 2019-05-03 LAB — TSH: TSH: 3.14 u[IU]/mL (ref 0.35–4.50)

## 2019-05-03 LAB — VITAMIN D 25 HYDROXY (VIT D DEFICIENCY, FRACTURES): VITD: 82.05 ng/mL (ref 30.00–100.00)

## 2019-05-03 LAB — LIPID PANEL
Cholesterol: 217 mg/dL — ABNORMAL HIGH (ref 0–200)
HDL: 59.8 mg/dL (ref >39.00)
LDL Cholesterol: 130 mg/dL — ABNORMAL HIGH (ref 0–99)
NonHDL: 156.79
Total CHOL/HDL Ratio: 4
Triglycerides: 136 mg/dL (ref 0.0–149.0)
VLDL: 27.2 mg/dL (ref 0.0–40.0)

## 2019-05-03 LAB — VITAMIN B12: Vitamin B-12: 983 pg/mL — ABNORMAL HIGH (ref 211–911)

## 2019-05-03 NOTE — Patient Instructions (Signed)
Ms. Hearn , Thank you for taking time to come for your Medicare Wellness Visit. I appreciate your ongoing commitment to your health goals. Please review the following plan we discussed and let me know if I can assist you in the future.   These are the goals we discussed: Goals    . Increase physical activity     Starting 55/28/2020, I will continue walking 30 min daily.        This is a list of the screening recommended for you and due dates:  Health Maintenance  Topic Date Due  . DTaP/Tdap/Td vaccine (1 - Tdap) 12/05/2020*  . Tetanus Vaccine  12/05/2020*  . Flu Shot  07/07/2019  . Mammogram  12/21/2019  . Colon Cancer Screening  08/08/2023  . DEXA scan (bone density measurement)  Completed  . Pneumonia vaccines  Completed  *Topic was postponed. The date shown is not the original due date.   Preventive Care for Adults  A healthy lifestyle and preventive care can promote health and wellness. Preventive health guidelines for adults include the following key practices.  . A routine yearly physical is a good way to check with your health care provider about your health and preventive screening. It is a chance to share any concerns and updates on your health and to receive a thorough exam.  . Visit your dentist for a routine exam and preventive care every 6 months. Brush your teeth twice a day and floss once a day. Good oral hygiene prevents tooth decay and gum disease.  . The frequency of eye exams is based on your age, health, family medical history, use  of contact lenses, and other factors. Follow your health care provider's recommendations for frequency of eye exams.  . Eat a healthy diet. Foods like vegetables, fruits, whole grains, low-fat dairy products, and lean protein foods contain the nutrients you need without too many calories. Decrease your intake of foods high in solid fats, added sugars, and salt. Eat the right amount of calories for you. Get information about a proper  diet from your health care provider, if necessary.  . Regular physical exercise is one of the most important things you can do for your health. Most adults should get at least 150 minutes of moderate-intensity exercise (any activity that increases your heart rate and causes you to sweat) each week. In addition, most adults need muscle-strengthening exercises on 2 or more days a week.  Silver Sneakers may be a benefit available to you. To determine eligibility, you may visit the website: www.silversneakers.com or contact program at [phone] Mon-Fri between 8AM-8PM.   . Maintain a healthy weight. The body mass index (BMI) is a screening tool to identify possible weight problems. It provides an estimate of body fat based on height and weight. Your health care provider can find your BMI and can help you achieve or maintain a healthy weight.   For adults 20 years and older: ? A BMI below 18.5 is considered underweight. ? A BMI of 18.5 to 24.9 is normal. ? A BMI of 25 to 29.9 is considered overweight. ? A BMI of 30 and above is considered obese.   . Maintain normal blood lipids and cholesterol levels by exercising and minimizing your intake of saturated fat. Eat a balanced diet with plenty of fruit and vegetables. Blood tests for lipids and cholesterol should begin at age 7 and be repeated every 5 years. If your lipid or cholesterol levels are high, you are over 50,  or you are at high risk for heart disease, you may need your cholesterol levels checked more frequently. Ongoing high lipid and cholesterol levels should be treated with medicines if diet and exercise are not working.  . If you smoke, find out from your health care provider how to quit. If you do not use tobacco, please do not start.  . If you choose to drink alcohol, please do not consume more than 2 drinks per day. One drink is considered to be 12 ounces (355 mL) of beer, 5 ounces (148 mL) of wine, or 1.5 ounces (44 mL) of  liquor.  . If you are 5-61 years old, ask your health care provider if you should take aspirin to prevent strokes.  . Use sunscreen. Apply sunscreen liberally and repeatedly throughout the day. You should seek shade when your shadow is shorter than you. Protect yourself by wearing long sleeves, pants, a wide-brimmed hat, and sunglasses year round, whenever you are outdoors.  . Once a month, do a whole body skin exam, using a mirror to look at the skin on your back. Tell your health care provider of new moles, moles that have irregular borders, moles that are larger than a pencil eraser, or moles that have changed in shape or color.
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
## 2019-05-03 NOTE — Progress Notes (Signed)
PCP notes:   Health maintenance:  Tetanus vaccine - postponed/insurance  Abnormal screenings:   Mini-Cog score: 14/17 MMSE - Mini Mental State Exam 05/03/2019 04/17/2018 04/13/2017 03/22/2016  Orientation to time 5 5 5 5   Orientation to Place 5 5 5 5   Registration 3 3 3 3   Attention/ Calculation 0 0 0 0  Recall 0 1 1 3   Recall-comments unable to recall 3 of 3 words unable to recall 2 of 3 words pt was unable to recall 2 of 3 words -  Language- name 2 objects 0 0 0 0  Language- repeat 1 1 1 1   Language- follow 3 step command 0 3 3 3   Language- read & follow direction 0 0 0 0  Write a sentence 0 0 0 0  Copy design 0 0 0 0  Total score 14 18 18 20      Patient concerns:   None  Nurse concerns:  None  Next PCP appt:   05/04/19 @ 0830

## 2019-05-04 ENCOUNTER — Ambulatory Visit (INDEPENDENT_AMBULATORY_CARE_PROVIDER_SITE_OTHER): Payer: Medicare Other | Admitting: Family Medicine

## 2019-05-04 ENCOUNTER — Encounter: Payer: Self-pay | Admitting: Family Medicine

## 2019-05-04 ENCOUNTER — Other Ambulatory Visit: Payer: Self-pay | Admitting: Family Medicine

## 2019-05-04 VITALS — BP 117/75 | HR 91 | Temp 97.3°F | Ht 62.0 in

## 2019-05-04 DIAGNOSIS — M810 Age-related osteoporosis without current pathological fracture: Secondary | ICD-10-CM | POA: Diagnosis not present

## 2019-05-04 DIAGNOSIS — G3184 Mild cognitive impairment, so stated: Secondary | ICD-10-CM | POA: Diagnosis not present

## 2019-05-04 DIAGNOSIS — E559 Vitamin D deficiency, unspecified: Secondary | ICD-10-CM | POA: Diagnosis not present

## 2019-05-04 DIAGNOSIS — E538 Deficiency of other specified B group vitamins: Secondary | ICD-10-CM | POA: Diagnosis not present

## 2019-05-04 DIAGNOSIS — E782 Mixed hyperlipidemia: Secondary | ICD-10-CM | POA: Diagnosis not present

## 2019-05-04 DIAGNOSIS — Z7189 Other specified counseling: Secondary | ICD-10-CM | POA: Diagnosis not present

## 2019-05-04 DIAGNOSIS — I82402 Acute embolism and thrombosis of unspecified deep veins of left lower extremity: Secondary | ICD-10-CM

## 2019-05-04 DIAGNOSIS — R7989 Other specified abnormal findings of blood chemistry: Secondary | ICD-10-CM | POA: Diagnosis not present

## 2019-05-04 MED ORDER — CALCIUM 600 MG PO TABS: 600.0000 mg | ORAL_TABLET | Freq: Every day | ORAL | Status: DC

## 2019-05-04 MED ORDER — MEMANTINE HCL 10 MG PO TABS: 10.0000 mg | ORAL_TABLET | Freq: Two times a day (BID) | ORAL | 11 refills | Status: DC

## 2019-05-04 MED ORDER — VITAMIN D 50 MCG (2000 UT) PO CAPS: 1.0000 | ORAL_CAPSULE | Freq: Every day | ORAL | Status: DC

## 2019-05-04 NOTE — Assessment & Plan Note (Signed)
Stable period on daily replacement.  ?

## 2019-05-04 NOTE — Assessment & Plan Note (Signed)
Appreciate heme care. Continue 2.5mg  BID maintenance dosing.

## 2019-05-04 NOTE — Assessment & Plan Note (Signed)
Encouraged regular weight bearing exercises.  rec start calcium 600mg  daily due to poor dietary calcium intake. Will readdress prolia option.

## 2019-05-04 NOTE — Assessment & Plan Note (Addendum)
Stable period. Continue namenda 10mg  bid.  Aricept stopped due to GI distress.

## 2019-05-04 NOTE — Assessment & Plan Note (Signed)
Advanced directives: Has info at home. Has not filled out. Would want sons to be HCPOA. Planning to see attorney.

## 2019-05-04 NOTE — Assessment & Plan Note (Signed)
TSH stable.  

## 2019-05-04 NOTE — Assessment & Plan Note (Signed)
repleted on weekly supplementation. rec start 2000 IU daily after she runs out of weekly pill.

## 2019-05-04 NOTE — Assessment & Plan Note (Signed)
Chronic, stable off medication.  The 10-year ASCVD risk score [name], et al., 2013) is: 13.8%   Values used to calculate the score:     Age: 78 years     Sex: Female     Is Non-Hispanic African American: No     Diabetic: No     Tobacco smoker: No     Systolic Blood Pressure: 872 mmHg     Is BP treated: No     HDL Cholesterol: 59.8 mg/dL     Total Cholesterol: 217 mg/dL

## 2019-05-04 NOTE — Progress Notes (Signed)
Kimberly Davies - 78 y.o. female  MRN [id number]  Date of Birth: [birth year]  PCP: Ria Bush, MD  This service was provided via telemedicine. Phone Visit performed on 05/04/2019    Rationale for phone visit along with limitations reviewed. Patient consented to telephone encounter.    Location of patient: home Location of provider: in office, [place] @ [facility] Name of referring provider: N/A   Names of persons and role in encounter: Provider: Ria Bush, MD  Patient: Kimberly Davies  Other: husband also present on call   Time on call: 8:50am - 9:04am   Subjective: Chief Complaint  Patient presents with  . Annual Exam    part 2     HPI:   Known memory loss - on namenda 10mg  bid. Pt states stable period "I know who I am", husband hasn't noticed much change.  Recurrent DVT to start maintenance 2.5mg  eliquis BID. She does regularly wear compression stockings and elevates legs.   Saw Katha Cabal this week for medicare wellness visit. Note reviewed.    Preventative: COLONOSCOPY Date: 08/2013 small diverticula o/w normal, rpt 10 yrs Ardis Hughs)  Well woman exam - no fmhx cervical cancer. Mammo -1/2019birads 1 - postponed due to covid19.  Dexa Date: 11/2016 T score spine -2.7 and hip -2.9. Discussed weight bearing exercises and calcium supplementation. Consider updated DEXA.  Flu shot yearly  Pneumovax 2011, [id number] Tetanus 2009. Zostavax 2012 Shingrix discussed Advanced directives: Has info at home. Has not filled out. Would want sons to be HCPOA. Planning to see attorney.  Seat belt use discussed  Sunscreen use discussed. No changing moles on skin.  Non smoker  Alcohol - none  Due for eye exam Dentist - yearly Bladder - no urinary incontinence. Wears pads.  Bowels - movements daily, no constipation.   Caffeine: none Lives with husband, 2 grown children. Occupation: retired, prior worked for [employer] and [employer] Activity: walks 1 mi + daily   Diet: good water, fruits/vegetables daily   Objective/Observations:   No physical exam or vital signs collected unless specifically identified below.   BP 117/75 Comment: per patient  Pulse 91 Comment: per patient  Temp (!) 97.3 F (36.3 C) Comment: per patient  Ht 5\' 2"  (1.575 m)   BMI 18.38 kg/m    Respiratory status: speaks in complete sentences without evident shortness of breath.   Results for orders placed or performed in visit on 05/03/19  TSH  Result Value Ref Range   TSH 3.14 0.35 - 4.50 uIU/mL  VITAMIN D 25 Hydroxy (Vit-D Deficiency, Fractures)  Result Value Ref Range   VITD 82.05 30.00 - 100.00 ng/mL  Vitamin B12  Result Value Ref Range   Vitamin B-12 983 (H) 211 - 911 pg/mL  Lipid panel  Result Value Ref Range   Cholesterol 217 (H) 0 - 200 mg/dL   Triglycerides 136.0 0.0 - 149.0 mg/dL   HDL 59.80 >39.00 mg/dL   VLDL 27.2 0.0 - 40.0 mg/dL   LDL Cholesterol 130 (H) 0 - 99 mg/dL   Total CHOL/HDL Ratio 4    NonHDL 156.79     Assessment/Plan:  I asked her to call back to schedule 4-6 mo f/u visit.   HLD (hyperlipidemia) Chronic, stable off medication.  The 10-year ASCVD risk score Mikey Bussing DC Jr., et al., 2013) is: 13.8%   Values used to calculate the score:     Age: 51 years     Sex: Female     Is Non-Hispanic  African American: No     Diabetic: No     Tobacco smoker: No     Systolic Blood Pressure: 409 mmHg     Is BP treated: No     HDL Cholesterol: 59.8 mg/dL     Total Cholesterol: 217 mg/dL   Advanced care planning/counseling discussion Advanced directives: Has info at home. Has not filled out. Would want sons to be HCPOA. Planning to see attorney.   Osteoporosis Encouraged regular weight bearing exercises.  rec start calcium 600mg  daily due to poor dietary calcium intake. Will readdress prolia option.   Vitamin d deficiency repleted on weekly supplementation. rec start 2000 IU daily after she runs out of weekly pill.   Vitamin B12 deficiency  Stable period on daily replacement.   Abnormal TSH TSH stable.   MCI (mild cognitive impairment) with memory loss Stable period. Continue namenda 10mg  bid.  Aricept stopped due to GI distress.   Recurrent acute deep vein thrombosis (DVT) of left lower extremity ([place]) Appreciate heme care. Continue 2.5mg  BID maintenance dosing.    I discussed the assessment and treatment plan with the patient. The patient was provided an opportunity to ask questions and all were answered. The patient agreed with the plan and demonstrated an understanding of the instructions.  Lab Orders  No laboratory test(s) ordered today    Meds ordered this encounter  Medications  . Cholecalciferol (VITAMIN D) 50 MCG (2000 UT) CAPS    Sig: Take 1 capsule (2,000 Units total) by mouth daily.    Dispense:  30 capsule  . memantine (NAMENDA) 10 MG tablet    Sig: Take 1 tablet (10 mg total) by mouth 2 (two) times daily.    Dispense:  60 tablet    Refill:  11  . calcium carbonate (OS-CAL) 600 MG tablet    Sig: Take 1 tablet (600 mg total) by mouth daily.    Dispense:  30 tablet    The patient was advised to call back or seek an in-person evaluation if the symptoms worsen or if the condition fails to improve as anticipated.  Ria Bush, MD
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
## 2019-06-08 NOTE — Progress Notes (Signed)
I reviewed health advisor's note, was available for consultation, and agree with documentation and plan.  

## 2019-07-29 ENCOUNTER — Other Ambulatory Visit: Payer: Self-pay | Admitting: Family Medicine

## 2019-07-31 ENCOUNTER — Other Ambulatory Visit: Payer: Self-pay

## 2019-07-31 ENCOUNTER — Inpatient Hospital Stay: Payer: Medicare Other | Attending: Oncology

## 2019-07-31 ENCOUNTER — Inpatient Hospital Stay (HOSPITAL_BASED_OUTPATIENT_CLINIC_OR_DEPARTMENT_OTHER): Payer: Medicare Other | Admitting: Oncology

## 2019-07-31 ENCOUNTER — Encounter: Payer: Self-pay | Admitting: Oncology

## 2019-07-31 VITALS — BP 117/66 | HR 77 | Temp 98.4°F | Wt 118.0 lb

## 2019-07-31 DIAGNOSIS — Z880 Allergy status to penicillin: Secondary | ICD-10-CM | POA: Diagnosis not present

## 2019-07-31 DIAGNOSIS — I82402 Acute embolism and thrombosis of unspecified deep veins of left lower extremity: Secondary | ICD-10-CM

## 2019-07-31 DIAGNOSIS — Z79899 Other long term (current) drug therapy: Secondary | ICD-10-CM | POA: Diagnosis not present

## 2019-07-31 DIAGNOSIS — Z823 Family history of stroke: Secondary | ICD-10-CM | POA: Insufficient documentation

## 2019-07-31 DIAGNOSIS — Z801 Family history of malignant neoplasm of trachea, bronchus and lung: Secondary | ICD-10-CM | POA: Insufficient documentation

## 2019-07-31 DIAGNOSIS — Z8052 Family history of malignant neoplasm of bladder: Secondary | ICD-10-CM | POA: Diagnosis not present

## 2019-07-31 DIAGNOSIS — I82462 Acute embolism and thrombosis of left calf muscular vein: Secondary | ICD-10-CM | POA: Insufficient documentation

## 2019-07-31 DIAGNOSIS — Z833 Family history of diabetes mellitus: Secondary | ICD-10-CM | POA: Insufficient documentation

## 2019-07-31 DIAGNOSIS — Z811 Family history of alcohol abuse and dependence: Secondary | ICD-10-CM | POA: Diagnosis not present

## 2019-07-31 DIAGNOSIS — Z7901 Long term (current) use of anticoagulants: Secondary | ICD-10-CM | POA: Diagnosis not present

## 2019-07-31 DIAGNOSIS — M79662 Pain in left lower leg: Secondary | ICD-10-CM | POA: Diagnosis not present

## 2019-07-31 DIAGNOSIS — Z86718 Personal history of other venous thrombosis and embolism: Secondary | ICD-10-CM | POA: Insufficient documentation

## 2019-07-31 DIAGNOSIS — F0390 Unspecified dementia without behavioral disturbance: Secondary | ICD-10-CM | POA: Insufficient documentation

## 2019-07-31 DIAGNOSIS — Z808 Family history of malignant neoplasm of other organs or systems: Secondary | ICD-10-CM | POA: Insufficient documentation

## 2019-07-31 DIAGNOSIS — Z8249 Family history of ischemic heart disease and other diseases of the circulatory system: Secondary | ICD-10-CM | POA: Diagnosis not present

## 2019-07-31 DIAGNOSIS — Z885 Allergy status to narcotic agent status: Secondary | ICD-10-CM | POA: Diagnosis not present

## 2019-07-31 LAB — CBC WITH DIFFERENTIAL/PLATELET
Abs Immature Granulocytes: 0.02 10*3/uL (ref 0.00–0.07)
Basophils Absolute: 0 10*3/uL (ref 0.0–0.1)
Basophils Relative: 1 %
Eosinophils Absolute: 0.1 10*3/uL (ref 0.0–0.5)
Eosinophils Relative: 1 %
HCT: 41.3 % (ref 36.0–46.0)
Hemoglobin: 12.9 g/dL (ref 12.0–15.0)
Immature Granulocytes: 0 %
Lymphocytes Relative: 28 %
Lymphs Abs: 2 10*3/uL (ref 0.7–4.0)
MCH: 28.4 pg (ref 26.0–34.0)
MCHC: 31.2 g/dL (ref 30.0–36.0)
MCV: 91 fL (ref 80.0–100.0)
Monocytes Absolute: 0.9 10*3/uL (ref 0.1–1.0)
Monocytes Relative: 12 %
Neutro Abs: 4.1 10*3/uL (ref 1.7–7.7)
Neutrophils Relative %: 58 %
Platelets: 336 10*3/uL (ref 150–400)
RBC: 4.54 MIL/uL (ref 3.87–5.11)
RDW: 13.6 % (ref 11.5–15.5)
WBC: 7 10*3/uL (ref 4.0–10.5)
nRBC: 0 % (ref 0.0–0.2)

## 2019-07-31 LAB — COMPREHENSIVE METABOLIC PANEL
ALT: 19 U/L (ref 0–44)
AST: 26 U/L (ref 15–41)
Albumin: 4.3 g/dL (ref 3.5–5.0)
Alkaline Phosphatase: 68 U/L (ref 38–126)
Anion gap: 8 (ref 5–15)
BUN: 16 mg/dL (ref 8–23)
CO2: 30 mmol/L (ref 22–32)
Calcium: 9.9 mg/dL (ref 8.9–10.3)
Chloride: 103 mmol/L (ref 98–111)
Creatinine, Ser: 0.72 mg/dL (ref 0.44–1.00)
GFR calc Af Amer: >60 mL/min (ref >60)
GFR calc non Af Amer: >60 mL/min (ref >60)
Glucose, Bld: 93 mg/dL (ref 70–99)
Potassium: 4.2 mmol/L (ref 3.5–5.1)
Sodium: 141 mmol/L (ref 135–145)
Total Bilirubin: 1 mg/dL (ref 0.3–1.2)
Total Protein: 8.3 g/dL — ABNORMAL HIGH (ref 6.5–8.1)

## 2019-07-31 NOTE — Progress Notes (Signed)
Patient here today for follow up.  Called patients husband via doximity to review chart due to patients MCI (mild cognitive impairment) with memory loss.  Husband states patient does complain of leg pain occasionally, no leg swelling and no concerns for today
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
## 2019-08-26 ENCOUNTER — Other Ambulatory Visit: Payer: Self-pay | Admitting: Oncology

## 2019-08-28 DIAGNOSIS — Z23 Encounter for immunization: Secondary | ICD-10-CM | POA: Diagnosis not present

## 2019-12-31 ENCOUNTER — Ambulatory Visit (INDEPENDENT_AMBULATORY_CARE_PROVIDER_SITE_OTHER): Payer: Medicare Other | Admitting: Internal Medicine

## 2019-12-31 ENCOUNTER — Encounter: Payer: Self-pay | Admitting: Internal Medicine

## 2019-12-31 ENCOUNTER — Other Ambulatory Visit: Payer: Self-pay

## 2019-12-31 VITALS — BP 116/68 | HR 86 | Temp 98.1°F | Wt 121.0 lb

## 2019-12-31 DIAGNOSIS — R300 Dysuria: Secondary | ICD-10-CM

## 2019-12-31 DIAGNOSIS — R350 Frequency of micturition: Secondary | ICD-10-CM

## 2019-12-31 LAB — POC URINALSYSI DIPSTICK (AUTOMATED)
Bilirubin, UA: NEGATIVE
Glucose, UA: NEGATIVE
Leukocytes, UA: NEGATIVE
Nitrite, UA: NEGATIVE
Protein, UA: POSITIVE — AB
Spec Grav, UA: 1.015 (ref 1.010–1.025)
Urobilinogen, UA: 0.2 E.U./dL
pH, UA: 7.5 (ref 5.0–8.0)

## 2019-12-31 NOTE — Patient Instructions (Signed)
Urinary Frequency, Adult Urinary frequency means urinating more often than usual. You may urinate every 1-2 hours even though you drink a normal amount of fluid and do not have a bladder infection or condition. Although you urinate more often than normal, the total amount of urine produced in a day is normal. With urinary frequency, you may have an urgent need to urinate often. The stress and anxiety of needing to find a bathroom quickly can make this urge worse. This condition may go away on its own or you may need treatment at home. Home treatment may include bladder training, exercises, taking medicines, or making changes to your diet. Follow these instructions at home: Bladder health   Keep a bladder diary if told by your health care provider. Keep track of: ? What you eat and drink. ? How often you urinate. ? How much you urinate.  Follow a bladder training program if told by your health care provider. This may include: ? Learning to delay going to the bathroom. ? Double urinating (voiding). This helps if you are not completely emptying your bladder. ? Scheduled voiding.  Do Kegel exercises as told by your health care provider. Kegel exercises strengthen the muscles that help control urination, which may help the condition. Eating and drinking  If told by your health care provider, make diet changes, such as: ? Avoiding caffeine. ? Drinking fewer fluids, especially alcohol. ? Not drinking in the evening. ? Avoiding foods or drinks that may irritate the bladder. These include coffee, tea, soda, artificial sweeteners, citrus, tomato-based foods, and chocolate. ? Eating foods that help prevent or ease constipation. Constipation can make this condition worse. Your health care provider may recommend that you:  Drink enough fluid to keep your urine pale yellow.  Take over-the-counter or prescription medicines.  Eat foods that are high in fiber, such as beans, whole grains, and fresh  fruits and vegetables.  Limit foods that are high in fat and processed sugars, such as fried or sweet foods. General instructions  Take over-the-counter and prescription medicines only as told by your health care provider.  Keep all follow-up visits as told by your health care provider. This is important. Contact a health care provider if:  You start urinating more often.  You feel pain or irritation when you urinate.  You notice blood in your urine.  Your urine looks cloudy.  You develop a fever.  You begin vomiting. Get help right away if:  You are unable to urinate. Summary  Urinary frequency means urinating more often than usual. With urinary frequency, you may urinate every 1-2 hours even though you drink a normal amount of fluid and do not have a bladder infection or other bladder condition.  Your health care provider may recommend that you keep a bladder diary, follow a bladder training program, or make dietary changes.  If told by your health care provider, do Kegel exercises to strengthen the muscles that help control urination.  Take over-the-counter and prescription medicines only as told by your health care provider.  Contact a health care provider if your symptoms do not improve or get worse. This information is not intended to replace advice given to you by your health care provider. Make sure you discuss any questions you have with your health care provider. Document Revised: 06/01/2018 Document Reviewed: 06/01/2018 Elsevier Patient Education  2020 [employer]  

## 2019-12-31 NOTE — Addendum Note (Signed)
Addended by: Lurlean Nanny on: 12/31/2019 04:57 PM   Modules accepted: Orders

## 2019-12-31 NOTE — Progress Notes (Signed)
HPI  Pt presents to the clinic today with c/o urinary frequency and dysuria. This started yesterday. She denies urgency, blood in her urine, fever, chills, nausea or low back pain. She denies vaginal complaints. She has not taken anything OTC for her symptoms.   Review of Systems  Past Medical History:  Diagnosis Date  . Acute deep vein thrombosis (DVT) of distal end of left lower extremity ([place]) 10/04/2016   Acute DVT of the left gastrocnemius veins. Acute SVT of the left lesser saphenous vein, beginning approximately 0.4- 0.5cm from its take-off from the popliteal vein. (09/2016)  . DVT (deep venous thrombosis) ([place]) 09/2016   s/p 3 mo xarelto  . Hyperlipidemia 05/2003  . Osteoporosis 2008   took actonel for 5 yrs  . Rectal bleeding 10/22/2016   xarelto related.     Family History  Problem Relation Age of Onset  . Stroke Mother   . Coronary artery disease Mother   . Cancer Father 56       Lung, smoker  . Diabetes Father   . Cancer Brother 46       AML Leukemia with mets to brain radiation  . Alcohol abuse Brother 60       alcohol abuse  . Cancer Sister        bladder, nonsmoker  . Colon cancer Neg Hx   . Breast cancer Neg Hx     Social History   Socioeconomic History  . Marital status: Married    Spouse name: Not on file  . Number of children: Not on file  . Years of education: Not on file  . Highest education level: Not on file  Occupational History  . Occupation: Retired  Tobacco Use  . Smoking status: Never Smoker  . Smokeless tobacco: Never Used  Substance and Sexual Activity  . Alcohol use: No  . Drug use: No  . Sexual activity: Not Currently  Other Topics Concern  . Not on file  Social History Narrative   Caffeine: none   Lives with husband, 2 grown children. Husband just retired.    Occupation: retired, prior worked for [employer] and [employer]   Activity: tries to walk daily   Diet: good water, fruits/vegetables daily   Social Determinants of  Radio broadcast assistant Strain:   . Difficulty of Paying Living Expenses: Not on file  Food Insecurity:   . Worried About Charity fundraiser in the Last Year: Not on file  . Ran Out of Food in the Last Year: Not on file  Transportation Needs:   . Lack of Transportation (Medical): Not on file  . Lack of Transportation (Non-Medical): Not on file  Physical Activity:   . Days of Exercise per Week: Not on file  . Minutes of Exercise per Session: Not on file  Stress:   . Feeling of Stress : Not on file  Social Connections:   . Frequency of Communication with Friends and Family: Not on file  . Frequency of Social Gatherings with Friends and Family: Not on file  . Attends Religious Services: Not on file  . Active Member of Clubs or Organizations: Not on file  . Attends Archivist Meetings: Not on file  . Marital Status: Not on file  Intimate Partner Violence:   . Fear of Current or Ex-Partner: Not on file  . Emotionally Abused: Not on file  . Physically Abused: Not on file  . Sexually Abused: Not on file  Allergies  Allergen Reactions  . Aricept [Donepezil] Nausea Only and Other (See Comments)    Nausea, epigastric pain  . Codeine     REACTION: DIZZY/RAPID HEARTBEAT  . Penicillins     REACTION: UNSPECIFIED     Constitutional: Denies fever, malaise, fatigue, headache or abrupt weight changes.   GU: Pt reportsfrequency and pain with urination. Denies urgency, burning sensation, blood in urine, odor or discharge. Skin: Denies redness, rashes, lesions or ulcercations.   No other specific complaints in a complete review of systems (except as listed in HPI above).    Objective:   Physical Exam  BP 116/68   Pulse 86   Temp 98.1 F (36.7 C) (Temporal)   Wt 121 lb (54.9 kg)   SpO2 97%   BMI 22.13 kg/m    Wt Readings from Last 3 Encounters:  07/31/19 118 lb (53.5 kg)  02/21/19 100 lb 8 oz (45.6 kg)  02/01/19 95 lb 12.8 oz (43.5 kg)    General: Appears  her stated age, well developed, well nourished in NAD. Cardiovascular: Normal rate and rhythm.  Pulmonary/Chest: Normal effort and positive vesicular breath sounds. No respiratory distress. No wheezes, rales or ronchi noted.  Abdomen: Soft. Normal bowel sounds. No distention or masses noted.  Tender to palpation over the bladder area. No CVA tenderness.        Assessment & Plan:   Frequency, Dysuria:  Urinalysis: trace blood Will send urine culture No indication for abx at this time unless culture positive. Drink plenty of fluids  RTC as needed or if symptoms persist. Webb Silversmith, NP  This visit occurred during the SARS-CoV-2 public health emergency.  Safety protocols were in place, including screening questions prior to the visit, additional usage of staff PPE, and extensive cleaning of exam room while observing appropriate contact time as indicated for disinfecting solutions.

## 2020-01-01 ENCOUNTER — Other Ambulatory Visit: Payer: Self-pay | Admitting: *Deleted

## 2020-01-01 LAB — URINE CULTURE
MICRO NUMBER:: 10076779
SPECIMEN QUALITY:: ADEQUATE

## 2020-01-01 MED ORDER — APIXABAN 2.5 MG PO TABS: 2.5000 mg | ORAL_TABLET | Freq: Two times a day (BID) | ORAL | 3 refills | Status: DC

## 2020-01-26 ENCOUNTER — Ambulatory Visit: Payer: Medicare Other | Attending: Internal Medicine

## 2020-01-26 DIAGNOSIS — Z23 Encounter for immunization: Secondary | ICD-10-CM

## 2020-01-26 NOTE — Progress Notes (Signed)
   Covid-19 Vaccination Clinic  Name:  Kimberly Davies    MRN: [id number] DOB: 04-01-1941  01/26/2020  Kimberly Davies was observed post Covid-19 immunization for 15 minutes without incidence. She was provided with Vaccine Information Sheet and instruction to access the V-Safe system.   Kimberly Davies was instructed to call 911 with any severe reactions post vaccine: Marland Kitchen Difficulty breathing  . Swelling of your face and throat  . A fast heartbeat  . A bad rash all over your body  . Dizziness and weakness    Immunizations Administered    Name Date Dose VIS Date Route   Pfizer COVID-19 Vaccine 01/26/2020  3:15 PM 0.3 mL 11/16/2019 Intramuscular   Manufacturer: [place]   Lot: [id number]   [place]: [id number]

## 2020-01-31 ENCOUNTER — Inpatient Hospital Stay: Payer: Medicare Other | Attending: Oncology

## 2020-01-31 ENCOUNTER — Other Ambulatory Visit: Payer: Self-pay

## 2020-01-31 ENCOUNTER — Encounter: Payer: Self-pay | Admitting: Oncology

## 2020-01-31 ENCOUNTER — Inpatient Hospital Stay (HOSPITAL_BASED_OUTPATIENT_CLINIC_OR_DEPARTMENT_OTHER): Payer: Medicare Other | Admitting: Oncology

## 2020-01-31 VITALS — BP 117/67 | HR 78 | Temp 97.4°F | Resp 18 | Wt 118.5 lb

## 2020-01-31 DIAGNOSIS — Z880 Allergy status to penicillin: Secondary | ICD-10-CM | POA: Diagnosis not present

## 2020-01-31 DIAGNOSIS — Z86718 Personal history of other venous thrombosis and embolism: Secondary | ICD-10-CM | POA: Insufficient documentation

## 2020-01-31 DIAGNOSIS — R413 Other amnesia: Secondary | ICD-10-CM | POA: Insufficient documentation

## 2020-01-31 DIAGNOSIS — M79662 Pain in left lower leg: Secondary | ICD-10-CM | POA: Diagnosis not present

## 2020-01-31 DIAGNOSIS — Z833 Family history of diabetes mellitus: Secondary | ICD-10-CM | POA: Insufficient documentation

## 2020-01-31 DIAGNOSIS — Z885 Allergy status to narcotic agent status: Secondary | ICD-10-CM | POA: Diagnosis not present

## 2020-01-31 DIAGNOSIS — M810 Age-related osteoporosis without current pathological fracture: Secondary | ICD-10-CM | POA: Diagnosis not present

## 2020-01-31 DIAGNOSIS — Z811 Family history of alcohol abuse and dependence: Secondary | ICD-10-CM | POA: Diagnosis not present

## 2020-01-31 DIAGNOSIS — I82402 Acute embolism and thrombosis of unspecified deep veins of left lower extremity: Secondary | ICD-10-CM

## 2020-01-31 DIAGNOSIS — E785 Hyperlipidemia, unspecified: Secondary | ICD-10-CM | POA: Diagnosis not present

## 2020-01-31 DIAGNOSIS — Z79899 Other long term (current) drug therapy: Secondary | ICD-10-CM | POA: Diagnosis not present

## 2020-01-31 DIAGNOSIS — Z8052 Family history of malignant neoplasm of bladder: Secondary | ICD-10-CM | POA: Insufficient documentation

## 2020-01-31 DIAGNOSIS — Z823 Family history of stroke: Secondary | ICD-10-CM | POA: Diagnosis not present

## 2020-01-31 DIAGNOSIS — Z8249 Family history of ischemic heart disease and other diseases of the circulatory system: Secondary | ICD-10-CM | POA: Insufficient documentation

## 2020-01-31 LAB — COMPREHENSIVE METABOLIC PANEL
ALT: 13 U/L (ref 0–44)
AST: 23 U/L (ref 15–41)
Albumin: 4.4 g/dL (ref 3.5–5.0)
Alkaline Phosphatase: 75 U/L (ref 38–126)
Anion gap: 10 (ref 5–15)
BUN: 14 mg/dL (ref 8–23)
CO2: 30 mmol/L (ref 22–32)
Calcium: 9.9 mg/dL (ref 8.9–10.3)
Chloride: 103 mmol/L (ref 98–111)
Creatinine, Ser: 0.75 mg/dL (ref 0.44–1.00)
GFR calc Af Amer: >60 mL/min (ref >60)
GFR calc non Af Amer: >60 mL/min (ref >60)
Glucose, Bld: 94 mg/dL (ref 70–99)
Potassium: 4.3 mmol/L (ref 3.5–5.1)
Sodium: 143 mmol/L (ref 135–145)
Total Bilirubin: 0.9 mg/dL (ref 0.3–1.2)
Total Protein: 8.2 g/dL — ABNORMAL HIGH (ref 6.5–8.1)

## 2020-01-31 LAB — CBC WITH DIFFERENTIAL/PLATELET
Abs Immature Granulocytes: 0.01 10*3/uL (ref 0.00–0.07)
Basophils Absolute: 0.1 10*3/uL (ref 0.0–0.1)
Basophils Relative: 1 %
Eosinophils Absolute: 0 10*3/uL (ref 0.0–0.5)
Eosinophils Relative: 1 %
HCT: 41.5 % (ref 36.0–46.0)
Hemoglobin: 12.7 g/dL (ref 12.0–15.0)
Immature Granulocytes: 0 %
Lymphocytes Relative: 28 %
Lymphs Abs: 1.8 10*3/uL (ref 0.7–4.0)
MCH: 28.7 pg (ref 26.0–34.0)
MCHC: 30.6 g/dL (ref 30.0–36.0)
MCV: 93.7 fL (ref 80.0–100.0)
Monocytes Absolute: 0.6 10*3/uL (ref 0.1–1.0)
Monocytes Relative: 9 %
Neutro Abs: 3.9 10*3/uL (ref 1.7–7.7)
Neutrophils Relative %: 61 %
Platelets: 313 10*3/uL (ref 150–400)
RBC: 4.43 MIL/uL (ref 3.87–5.11)
RDW: 13.4 % (ref 11.5–15.5)
WBC: 6.3 10*3/uL (ref 4.0–10.5)
nRBC: 0 % (ref 0.0–0.2)

## 2020-01-31 MED ORDER — APIXABAN 2.5 MG PO TABS: 2.5000 mg | ORAL_TABLET | Freq: Two times a day (BID) | ORAL | 12 refills | Status: DC
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
## 2020-01-31 NOTE — Progress Notes (Signed)
Patient here for follow up. No new concerns voiced.  °

## 2020-02-20 ENCOUNTER — Ambulatory Visit: Payer: Medicare Other | Attending: Internal Medicine

## 2020-02-20 DIAGNOSIS — Z23 Encounter for immunization: Secondary | ICD-10-CM

## 2020-02-20 NOTE — Progress Notes (Signed)
   Covid-19 Vaccination Clinic  Name:  Kimberly Davies    MRN: [id number] DOB: Sep 14, 1941  02/20/2020  [name] was observed post Covid-19 immunization for 15 minutes without incident. She was provided with Vaccine Information Sheet and instruction to access the V-Safe system.   [name] was instructed to call 911 with any severe reactions post vaccine: Marland Kitchen Difficulty breathing  . Swelling of face and throat  . A fast heartbeat  . A bad rash all over body  . Dizziness and weakness   Immunizations Administered    Name Date Dose VIS Date Route   Pfizer COVID-19 Vaccine 02/20/2020  9:54 AM 0.3 mL 11/16/2019 Intramuscular   Manufacturer: [place]   Lot: [id number]   [place]: [id number]

## 2020-08-03 ENCOUNTER — Other Ambulatory Visit: Payer: Self-pay

## 2020-08-03 ENCOUNTER — Emergency Department
Admission: EM | Admit: 2020-08-03 | Discharge: 2020-08-04 | Disposition: A | Discharge status: Home / Self Care | Payer: Medicare Other | Attending: Emergency Medicine | Admitting: Emergency Medicine

## 2020-08-03 ENCOUNTER — Emergency Department: Payer: Medicare Other

## 2020-08-03 ENCOUNTER — Encounter: Payer: Self-pay | Admitting: Emergency Medicine

## 2020-08-03 DIAGNOSIS — M79605 Pain in left leg: Secondary | ICD-10-CM | POA: Diagnosis present

## 2020-08-03 DIAGNOSIS — Z5321 Procedure and treatment not carried out due to patient leaving prior to being seen by health care provider: Secondary | ICD-10-CM | POA: Insufficient documentation

## 2020-08-03 HISTORY — DX: Unspecified dementia, unspecified severity, without behavioral disturbance, psychotic disturbance, mood disturbance, and anxiety: F03.90

## 2020-08-03 NOTE — ED Triage Notes (Signed)
Husband states that the patient started complaining of left leg pain that started yesterday. Patient has a history of DVTs.

## 2020-08-03 NOTE — ED Notes (Signed)
Unable to locate

## 2020-08-05 ENCOUNTER — Telehealth: Payer: Self-pay | Admitting: *Deleted

## 2020-08-05 NOTE — Telephone Encounter (Signed)
Husband called asking if patient needs to see Dr Tasia Catchings due to still having a small knot in her calf. He reports that she went to ER and does not have a DVT, but wants to know if she needs to be seen as it has not gone away. Please advise

## 2020-08-08 NOTE — Telephone Encounter (Signed)
Kimberly Davies please schedule patient for MD only one day next week. Please notify pt of appt.

## 2020-08-08 NOTE — Telephone Encounter (Signed)
Done.. Pt has been sched for an MD only appt Called and made pts husband Kimberly Davies aware of her sched 08/15/20 appt @ 9:00

## 2020-08-08 NOTE — Telephone Encounter (Signed)
Please advise 

## 2020-08-15 ENCOUNTER — Inpatient Hospital Stay: Payer: Medicare Other | Attending: Oncology | Admitting: Oncology

## 2020-08-15 ENCOUNTER — Other Ambulatory Visit: Payer: Self-pay

## 2020-08-15 ENCOUNTER — Encounter: Payer: Self-pay | Admitting: Oncology

## 2020-08-15 VITALS — BP 118/77 | HR 86 | Temp 97.1°F | Resp 16 | Wt 102.6 lb

## 2020-08-15 DIAGNOSIS — Z806 Family history of leukemia: Secondary | ICD-10-CM | POA: Diagnosis not present

## 2020-08-15 DIAGNOSIS — Z86718 Personal history of other venous thrombosis and embolism: Secondary | ICD-10-CM | POA: Diagnosis not present

## 2020-08-15 DIAGNOSIS — Z7901 Long term (current) use of anticoagulants: Secondary | ICD-10-CM | POA: Diagnosis not present

## 2020-08-15 DIAGNOSIS — I82462 Acute embolism and thrombosis of left calf muscular vein: Secondary | ICD-10-CM | POA: Diagnosis present

## 2020-08-15 DIAGNOSIS — Z811 Family history of alcohol abuse and dependence: Secondary | ICD-10-CM | POA: Insufficient documentation

## 2020-08-15 DIAGNOSIS — F0390 Unspecified dementia without behavioral disturbance: Secondary | ICD-10-CM | POA: Diagnosis not present

## 2020-08-15 DIAGNOSIS — Z801 Family history of malignant neoplasm of trachea, bronchus and lung: Secondary | ICD-10-CM | POA: Diagnosis not present

## 2020-08-15 DIAGNOSIS — Z823 Family history of stroke: Secondary | ICD-10-CM | POA: Insufficient documentation

## 2020-08-15 DIAGNOSIS — I82402 Acute embolism and thrombosis of unspecified deep veins of left lower extremity: Secondary | ICD-10-CM | POA: Diagnosis not present

## 2020-08-15 DIAGNOSIS — Z9049 Acquired absence of other specified parts of digestive tract: Secondary | ICD-10-CM | POA: Insufficient documentation

## 2020-08-15 DIAGNOSIS — I8390 Asymptomatic varicose veins of unspecified lower extremity: Secondary | ICD-10-CM | POA: Diagnosis not present

## 2020-08-15 DIAGNOSIS — Z8249 Family history of ischemic heart disease and other diseases of the circulatory system: Secondary | ICD-10-CM | POA: Insufficient documentation

## 2020-08-15 DIAGNOSIS — Z880 Allergy status to penicillin: Secondary | ICD-10-CM | POA: Insufficient documentation

## 2020-08-15 DIAGNOSIS — Z885 Allergy status to narcotic agent status: Secondary | ICD-10-CM | POA: Insufficient documentation

## 2020-08-15 DIAGNOSIS — Z833 Family history of diabetes mellitus: Secondary | ICD-10-CM | POA: Diagnosis not present

## 2020-08-15 DIAGNOSIS — Z79899 Other long term (current) drug therapy: Secondary | ICD-10-CM | POA: Diagnosis not present

## 2020-08-15 DIAGNOSIS — I8393 Asymptomatic varicose veins of bilateral lower extremities: Secondary | ICD-10-CM | POA: Insufficient documentation

## 2020-08-15 DIAGNOSIS — Z8052 Family history of malignant neoplasm of bladder: Secondary | ICD-10-CM | POA: Diagnosis not present

## 2020-08-15 NOTE — Progress Notes (Signed)
Patient her for evaluation of knot on left calf.  The knot is slowly improving.
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
## 2020-08-28 ENCOUNTER — Other Ambulatory Visit: Payer: Self-pay | Admitting: Family Medicine

## 2020-10-27 ENCOUNTER — Other Ambulatory Visit: Payer: Self-pay | Admitting: Family Medicine

## 2020-10-27 NOTE — Telephone Encounter (Signed)
ERx 

## 2020-11-17 ENCOUNTER — Telehealth: Payer: Self-pay

## 2020-11-17 NOTE — Telephone Encounter (Signed)
Thank you. will see then.

## 2020-11-17 NOTE — Telephone Encounter (Signed)
pts husband said that gradually pts memory has been worsening but the last month memory has really gotten bad;  Now pt cannot dress herself; it is like she does not know how to put clothes on; sometimes pt will put on clothes and then put other clothes on over that that is not appropriate. Pt does know who she is but is not always aware of where she is; last 2 - 3 mornings pt is at home but tells her husband she wants to go home; he assures her she is home but he has to drive pt around for short period and then go home and pt is OK and thinks she is at home. Pt did not sleep hardly at all last night. Pt asked husband to call her sister this morning and then told pt to go ahead and go for his walk. When Mr Bortle got home from walk his sister in law was at his home and said that he should contact pts doctor that the pt told her sister that this morning Mr Eversley was shaking and screaming at pt which Mr Mandt totally denies. Pt has been taking Namenda 10 mg bid and only occasionally taking lorazepam 0.5 mg  if going somewhere and Mr Meter said the Lorazepam does not help the anxiety. Pt is not having any pain that Mr Aristizabal is aware of, no bowel or bladder changes; pt urinates frequently routinely and the frequency has not changed. Dr Darnell Level said he would see pt on 11/18/20 at 12:30. No covid symptoms. UC & ED precautions given and Mr Albarracin voiced understanding. Pt is not SI/HI. FYI to Dr Darnell Level.

## 2020-11-18 ENCOUNTER — Ambulatory Visit (INDEPENDENT_AMBULATORY_CARE_PROVIDER_SITE_OTHER): Payer: Medicare Other | Admitting: Family Medicine

## 2020-11-18 ENCOUNTER — Encounter: Payer: Self-pay | Admitting: Family Medicine

## 2020-11-18 ENCOUNTER — Other Ambulatory Visit: Payer: Self-pay

## 2020-11-18 VITALS — BP 116/80 | HR 84 | Temp 97.5°F | Ht 63.0 in | Wt 92.4 lb

## 2020-11-18 DIAGNOSIS — F411 Generalized anxiety disorder: Secondary | ICD-10-CM

## 2020-11-18 DIAGNOSIS — F0281 Dementia in other diseases classified elsewhere with behavioral disturbance: Secondary | ICD-10-CM | POA: Diagnosis not present

## 2020-11-18 DIAGNOSIS — I82402 Acute embolism and thrombosis of unspecified deep veins of left lower extremity: Secondary | ICD-10-CM

## 2020-11-18 DIAGNOSIS — M810 Age-related osteoporosis without current pathological fracture: Secondary | ICD-10-CM

## 2020-11-18 DIAGNOSIS — G309 Alzheimer's disease, unspecified: Secondary | ICD-10-CM

## 2020-11-18 DIAGNOSIS — R7989 Other specified abnormal findings of blood chemistry: Secondary | ICD-10-CM | POA: Diagnosis not present

## 2020-11-18 LAB — CBC WITH DIFFERENTIAL/PLATELET
Basophils Absolute: 0.1 10*3/uL (ref 0.0–0.1)
Basophils Relative: 0.9 % (ref 0.0–3.0)
Eosinophils Absolute: 0 10*3/uL (ref 0.0–0.7)
Eosinophils Relative: 0.2 % (ref 0.0–5.0)
HCT: 41.8 % (ref 36.0–46.0)
Hemoglobin: 13.6 g/dL (ref 12.0–15.0)
Lymphocytes Relative: 29.5 % (ref 12.0–46.0)
Lymphs Abs: 2.4 10*3/uL (ref 0.7–4.0)
MCHC: 32.6 g/dL (ref 30.0–36.0)
MCV: 88.2 fl (ref 78.0–100.0)
Monocytes Absolute: 0.8 10*3/uL (ref 0.1–1.0)
Monocytes Relative: 9.9 % (ref 3.0–12.0)
Neutro Abs: 4.9 10*3/uL (ref 1.4–7.7)
Neutrophils Relative %: 59.5 % (ref 43.0–77.0)
Platelets: 380 10*3/uL (ref 150.0–400.0)
RBC: 4.74 Mil/uL (ref 3.87–5.11)
RDW: 14.6 % (ref 11.5–15.5)
WBC: 8.2 10*3/uL (ref 4.0–10.5)

## 2020-11-18 LAB — BASIC METABOLIC PANEL
BUN: 9 mg/dL (ref 6–23)
CO2: 30 mEq/L (ref 19–32)
Calcium: 10.3 mg/dL (ref 8.4–10.5)
Chloride: 100 mEq/L (ref 96–112)
Creatinine, Ser: 0.77 mg/dL (ref 0.40–1.20)
GFR: 74.64 mL/min (ref >60.00)
Glucose, Bld: 87 mg/dL (ref 70–99)
Potassium: 3.7 mEq/L (ref 3.5–5.1)
Sodium: 141 mEq/L (ref 135–145)

## 2020-11-18 LAB — VITAMIN B12: Vitamin B-12: 877 pg/mL (ref 211–911)

## 2020-11-18 LAB — TSH: TSH: 2.31 u[IU]/mL (ref 0.35–4.50)

## 2020-11-18 MED ORDER — SERTRALINE HCL 25 MG PO TABS: 25.0000 mg | ORAL_TABLET | Freq: Every day | ORAL | 6 refills | Status: DC

## 2020-11-18 NOTE — Assessment & Plan Note (Addendum)
Marked progression of memory trouble as evidenced by worsened MMSE over the past 2 yrs (23 --> 9) and interactions between husband and son. They note increased agitation, increased anxiety, more trouble at bedtime (sundowning). Dependent in all IADLs, mostly independent in ADLs.  Not consistent with FTD, vascular dementia or LBD/PD. Anticipate alzheimer's dementia - discussed with pt and family. Start sertraline 25mg  daily for possible anxiety component, may continue sparing lorazepam use.  Check for reversible causes with UA, CBC, BMP, update TSH and B12 (patient did not leave urine sample)  Son is interested in neurology evaluation - referral placed. Will provide with information for local dementia resources. They reside in [facility]. Husband doesn't think she would do well in senior day care program.  Continue namenda for now.

## 2020-11-18 NOTE — Patient Instructions (Addendum)
Labs today.  Urine test today.  Start mood medicine sertraline 25mg  daily.  We will refer you to neurologist for evaluation.  Return in 6 weeks for follow up visit.   Dementia Caregiver Guide Dementia is a term used to describe a number of symptoms that affect memory and thinking. The most common symptoms include:  Memory loss.  Trouble with language and communication.  Trouble concentrating.  Poor judgment.  Problems with reasoning.  Child-like behavior and language.  Extreme anxiety.  Angry outbursts.  Wandering from home or public places. Dementia usually gets worse slowly over time. In the early stages, people with dementia can stay independent and safe with some help. In later stages, they need help with daily tasks such as dressing, grooming, and using the bathroom. How to help the person with dementia cope Dementia can be frightening and confusing. Here are some tips to help the person with dementia cope with changes caused by the disease. General tips  Keep the person on track with his or her routine.  Try to identify areas where the person may need help.  Be supportive, patient, calm, and encouraging.  Gently remind the person that adjusting to changes takes time.  Help with the tasks that the person has asked for help with.  Keep the person involved in daily tasks and decisions as much as possible.  Encourage conversation, but try not to get frustrated or harried if the person struggles to find words or does not seem to appreciate your help. Communication tips  When the person is talking or seems frustrated, make eye contact and hold the person's hand.  Ask specific questions that need yes or no answers.  Use simple words, short sentences, and a calm voice. Only give one direction at a time.  When offering choices, limit them to just 1 or 2.  Avoid correcting the person in a negative way.  If the person is struggling to find the right words, gently  try to help him or her. How to recognize symptoms of stress Symptoms of stress in caregivers include:  Feeling frustrated or angry with the person with dementia.  Denying that the person has dementia or that his or her symptoms will not improve.  Feeling hopeless and unappreciated.  Difficulty sleeping.  Difficulty concentrating.  Feeling anxious, irritable, or depressed.  Developing stress-related health problems.  Feeling like you have too little time for your own life. Follow these instructions at home:   Make sure that you and the person you are caring for: ? Get regular sleep. ? Exercise regularly. ? Eat regular, nutritious meals. ? Drink enough fluid to keep your urine clear or pale yellow. ? Take over-the-counter and prescription medicines only as told by your health care providers. ? Attend all scheduled health care appointments.  Join a support group with others who are caregivers.  Ask about respite care resources so that you can have a regular break from the stress of caregiving.  Look for signs of stress in yourself and in the person you are caring for. If you notice signs of stress, take steps to manage it.  Consider any safety risks and take steps to avoid them.  Organize medications in a pill box for each day of the week.  Create a plan to handle any legal or financial matters. Get legal or financial advice if needed.  Keep a calendar in a central location to remind the person of appointments or other activities. Tips for reducing the risk of  injury  Keep floors clear of clutter. Remove rugs, magazine racks, and floor lamps.  Keep hallways well lit, especially at night.  Put a handrail and nonslip mat in the bathtub or shower.  Put childproof locks on cabinets that contain dangerous items, such as medicines, alcohol, guns, toxic cleaning items, sharp tools or utensils, matches, and lighters.  Put the locks in places where the person cannot see or  reach them easily. This will help ensure that the person does not wander out of the house and get lost.  Be prepared for emergencies. Keep a list of emergency phone numbers and addresses in a convenient area.  Remove car keys and lock garage doors so that the person does not try to get in the car and drive.  Have the person wear a bracelet that tracks locations and identifies the person as having memory problems. This should be worn at all times for safety. Where to find support: Many individuals and organizations offer support. These include:  Support groups for people with dementia and for caregivers.  Counselors or therapists.  Home health care services.  Adult day care centers. Where to find more information Alzheimer's Association: [employer] Contact a health care provider if:  The person's health is rapidly getting worse.  You are no longer able to care for the person.  Caring for the person is affecting your physical and emotional health.  The person threatens himself or herself, you, or anyone else. Summary  Dementia is a term used to describe a number of symptoms that affect memory and thinking.  Dementia usually gets worse slowly over time.  Take steps to reduce the person's risk of injury, and to plan for future care.  Caregivers need support, relief from caregiving, and time for their own lives. This information is not intended to replace advice given to you by your health care provider. Make sure you discuss any questions you have with your health care provider. Document Revised: 11/04/2017 Document Reviewed: 10/26/2016 Elsevier Patient Education  2020 [employer].

## 2020-11-18 NOTE — Assessment & Plan Note (Addendum)
Contributing to agitation - start sertraline 25mg  daily. Continue lorazepam PRN.  Discussed possible seroquel for night time agitation/sundowning but would like to avoid if possible.

## 2020-11-18 NOTE — Assessment & Plan Note (Addendum)
rec restart calcium supplements due to poor dietary calcium intake. Continue vit D replacement.

## 2020-11-18 NOTE — Assessment & Plan Note (Signed)
Update TSH

## 2020-11-18 NOTE — Progress Notes (Addendum)
Patient ID: Kimberly Davies, female    DOB: 1941/02/07, 79 y.o.   MRN: [id number]  This visit was conducted in person.  BP 116/80 (BP Location: Left Arm, Patient Position: Sitting, Cuff Size: Normal)   Pulse 84   Temp (!) 97.5 F (36.4 C) (Temporal)   Ht 5\' 3"  (1.6 m)   Wt 92 lb 6 oz (41.9 kg)   SpO2 97% Comment: RA  BMI 16.36 kg/m    CC: worsening memory loss Subjective:   HPI: Kimberly Davies is a 79 y.o. female presenting on 11/18/2020 for Memory Loss (Per husband, pt's memory is worsening.  Pt accompanied by husband, [name]- temp 97.8.) Pt also accompanied by son.   I last saw patient 04/2019.  H/o mild cognitive impairment on namenda 10mg  bid. Trouble tolerating aricept due to GI upset.  Last MMSE 23/30 (12/2018).  See recent phone note. Concern for worsening memory trouble as evidenced by increasing agitation, wanting to go home even though she's at home, social withdrawal (didn't enjoy going to son's house for Thanksgiving), and possible confabulation (called sisters to say husband was yelling at her and shaking her, sister had to stay the night).   Last brain imaging - MRI brain 12/2018 - mild atrophy and mild chronic microvascular ischemic changes.  Continues b12 10100mcg daily as well as vit D daily. They stopped calcium supplements a while ago.  On maintenance dose eliquis for h/o recurrent DVT.  Husband has to help her get dressed as she forgets how to put clothes on.  No longer driving.    Geriatric Assessment: Activities of Daily Living:     Bathing- partially dependent     Dressing- dependent     Eating- independent     Toileting- independent     Transferring- independent     Continence- independent  Overall Assessment: largely independent  Instrumental Activities of Daily Living:     Transportation- dependent     Meal/Food Preparation- dependent     Shopping Errands- dependent     Housekeeping/Chores- dependent     Money Management/Finances- dependent      Medication Management- dependent     Ability to Use Telephone- dependent     Laundry- dependent  Overall Assessment: dependent   Mental Status Exam: 9/30 (value/max value)     Relevant past medical, surgical, family and social history reviewed and updated as indicated. Interim medical history since our last visit reviewed. Allergies and medications reviewed and updated. Outpatient Medications Prior to Visit  Medication Sig Dispense Refill  . apixaban (ELIQUIS) 2.5 MG TABS tablet Take 1 tablet (2.5 mg total) by mouth 2 (two) times daily. 60 tablet 12  . Cholecalciferol (VITAMIN D) 50 MCG (2000 UT) CAPS Take 1 capsule (2,000 Units total) by mouth daily. 30 capsule   . LORazepam (ATIVAN) 0.5 MG tablet TAKE 1 TABLET (0.5 MG TOTAL) BY MOUTH DAILY AS NEEDED FOR ANXIETY OR SLEEP. 20 tablet 0  . vitamin B-12 (CYANOCOBALAMIN) 1000 MCG tablet Take 1 tablet (1,000 mcg total) by mouth daily.    . memantine (NAMENDA) 10 MG tablet Take 1 tablet (10 mg total) by mouth 2 (two) times daily. Needs Office Visit 180 tablet 0  . calcium carbonate (OS-CAL) 600 MG tablet Take 1 tablet (600 mg total) by mouth daily. 30 tablet    No facility-administered medications prior to visit.     Per HPI unless specifically indicated in ROS section below Review of Systems Objective:  BP 116/80 (BP Location:  Left Arm, Patient Position: Sitting, Cuff Size: Normal)   Pulse 84   Temp (!) 97.5 F (36.4 C) (Temporal)   Ht 5\' 3"  (1.6 m)   Wt 92 lb 6 oz (41.9 kg)   SpO2 97% Comment: RA  BMI 16.36 kg/m   Wt Readings from Last 3 Encounters:  11/18/20 92 lb 6 oz (41.9 kg)  08/15/20 102 lb 9.6 oz (46.5 kg)  08/03/20 110 lb (49.9 kg)      Physical Exam Vitals and nursing note reviewed.  Constitutional:      Appearance: Normal appearance. She is not ill-appearing.  Cardiovascular:     Rate and Rhythm: Normal rate and regular rhythm.     Pulses: Normal pulses.     Heart sounds: Normal heart sounds. No murmur  heard.   Pulmonary:     Effort: Pulmonary effort is normal. No respiratory distress.     Breath sounds: Normal breath sounds. No wheezing, rhonchi or rales.  Musculoskeletal:        General: Normal range of motion.     Right lower leg: No edema.     Left lower leg: No edema.  Neurological:     General: No focal deficit present.     Mental Status: She is alert.     Cranial Nerves: Cranial nerves are intact.     Sensory: Sensation is intact.     Motor: Motor function is intact.     Coordination: Coordination is intact. Romberg sign negative. Coordination normal.     Gait: Gait is intact.     Comments:  CN 2-12 intact FTN intact EOMI No pronator drift No cogwheel rigidity or masked facies  No shuffling gait  Psychiatric:        Mood and Affect: Mood normal.        Behavior: Behavior normal.       Results for orders placed or performed in visit on 11/18/20  TSH  Result Value Ref Range   TSH 2.31 0.35 - 4.50 uIU/mL  CBC with Differential/Platelet  Result Value Ref Range   WBC 8.2 4.0 - 10.5 K/uL   RBC 4.74 3.87 - 5.11 Mil/uL   Hemoglobin 13.6 12.0 - 15.0 g/dL   HCT 41.8 36.0 - 46.0 %   MCV 88.2 78.0 - 100.0 fl   MCHC 32.6 30.0 - 36.0 g/dL   RDW 14.6 11.5 - 15.5 %   Platelets 380.0 150.0 - 400.0 K/uL   Neutrophils Relative % 59.5 43.0 - 77.0 %   Lymphocytes Relative 29.5 12.0 - 46.0 %   Monocytes Relative 9.9 3.0 - 12.0 %   Eosinophils Relative 0.2 0.0 - 5.0 %   Basophils Relative 0.9 0.0 - 3.0 %   Neutro Abs 4.9 1.4 - 7.7 K/uL   Lymphs Abs 2.4 0.7 - 4.0 K/uL   Monocytes Absolute 0.8 0.1 - 1.0 K/uL   Eosinophils Absolute 0.0 0.0 - 0.7 K/uL   Basophils Absolute 0.1 0.0 - 0.1 K/uL  Vitamin B12  Result Value Ref Range   Vitamin B-12 877 211 - 911 pg/mL  Basic metabolic panel  Result Value Ref Range   Sodium 141 135 - 145 mEq/L   Potassium 3.7 3.5 - 5.1 mEq/L   Chloride 100 96 - 112 mEq/L   CO2 30 19 - 32 mEq/L   Glucose, Bld 87 70 - 99 mg/dL   BUN 9 6 - 23  mg/dL   Creatinine, Ser 0.77 0.40 - 1.20 mg/dL   GFR 74.64 >60.00  mL/min   Calcium 10.3 8.4 - 10.5 mg/dL   Assessment & Plan:  This visit occurred during the SARS-CoV-2 public health emergency.  Safety protocols were in place, including screening questions prior to the visit, additional usage of staff PPE, and extensive cleaning of exam room while observing appropriate contact time as indicated for disinfecting solutions.   Problem List Items Addressed This Visit    Recurrent acute deep vein thrombosis (DVT) of left lower extremity (HCC)    Continues eliquis 2.5mg  BID.      Osteoporosis    rec restart calcium supplements due to poor dietary calcium intake. Continue vit D replacement.       Dementia ([place]) - Primary    Marked progression of memory trouble as evidenced by worsened MMSE over the past 2 yrs (23 --> 9) and interactions between husband and son. They note increased agitation, increased anxiety, more trouble at bedtime (sundowning). Dependent in all IADLs, mostly independent in ADLs.  Not consistent with FTD, vascular dementia or LBD/PD. Anticipate alzheimer's dementia - discussed with pt and family. Start sertraline 25mg  daily for possible anxiety component, may continue sparing lorazepam use.  Check for reversible causes with UA, CBC, BMP, update TSH and B12 (patient did not leave urine sample)  Son is interested in neurology evaluation - referral placed. Will provide with information for local dementia resources. They reside in [facility]. Husband doesn't think she would do well in senior day care program.  Continue namenda for now.       Relevant Medications   sertraline (ZOLOFT) 25 MG tablet   Other Relevant Orders   TSH (Completed)   CBC with Differential/Platelet (Completed)   Vitamin B12 (Completed)   Basic metabolic panel (Completed)   Ambulatory referral to Neurology   Anxiety state    Contributing to agitation - start sertraline 25mg  daily. Continue  lorazepam PRN.  Discussed possible seroquel for night time agitation/sundowning but would like to avoid if possible.       Relevant Medications   sertraline (ZOLOFT) 25 MG tablet   Abnormal TSH    Update TSH.           Meds ordered this encounter  Medications  . sertraline (ZOLOFT) 25 MG tablet    Sig: Take 1 tablet (25 mg total) by mouth daily.    Dispense:  30 tablet    Refill:  6   Orders Placed This Encounter  Procedures  . TSH  . CBC with Differential/Platelet  . Vitamin B12  . Basic metabolic panel  . Ambulatory referral to Neurology    Referral Priority:   Routine    Referral Type:   Consultation    Referral Reason:   Specialty Services Required    Requested Specialty:   Neurology    Number of Visits Requested:   1    Patient instructions: Labs today.  Urine test today.  Start mood medicine sertraline 25mg  daily.  We will refer you to neurologist for evaluation.  Return in 6 weeks for follow up visit.   Follow up plan: Return if symptoms worsen or fail to improve.  Ria Bush, MD

## 2020-11-20 ENCOUNTER — Other Ambulatory Visit: Payer: Self-pay | Admitting: Family Medicine

## 2020-11-20 MED ORDER — VITAMIN E 1000 UNITS PO CAPS: 1000.0000 [IU] | ORAL_CAPSULE | Freq: Two times a day (BID) | ORAL | Status: DC

## 2020-11-20 MED ORDER — MEMANTINE HCL 10 MG PO TABS
10.0000 mg | ORAL_TABLET | Freq: Two times a day (BID) | ORAL | 1 refills | Status: DC
Start: 2020-11-20 — End: 2021-04-01

## 2020-11-20 NOTE — Assessment & Plan Note (Signed)
Continues eliquis 2.5mg  BID.

## 2020-12-15 ENCOUNTER — Telehealth: Payer: Self-pay | Admitting: Family Medicine

## 2020-12-15 MED ORDER — LORAZEPAM 0.5 MG PO TABS: 0.5000 mg | ORAL_TABLET | Freq: Every day | ORAL | 0 refills | Status: DC | PRN

## 2020-12-15 MED ORDER — QUETIAPINE FUMARATE 25 MG PO TABS: 25.0000 mg | ORAL_TABLET | Freq: Every day | ORAL | 1 refills | Status: DC

## 2020-12-15 NOTE — Telephone Encounter (Signed)
Patient's husband called in stating her dementia is increasing to get worse. Over last 5 days has increased more than usual. Stated she will cry and scream. Stated sister is sometimes to redirect her and he is getting more stressed. Feels like zoloft is not assisting anymore. Requesting advice or if PCP needs to be seen sooner. Please advise.

## 2020-12-15 NOTE — Telephone Encounter (Signed)
Spoke with husband.  Acute worsening since last Thursday, onset seems to be worse at 5:30pm. She gets upset, wants to go home.  Sertraline hasn't helped. Lorazepam does seem to help.  Discussed sundowning, rec reorientation as much as able.  Ok to use lorazepam PRN agitation, may try seroquel 25mg  QHS PRN as well. Reviewed this was "last resort" med due to possible increased mortality in dementia.

## 2020-12-30 ENCOUNTER — Encounter: Payer: Self-pay | Admitting: Family Medicine

## 2020-12-30 ENCOUNTER — Other Ambulatory Visit: Payer: Self-pay

## 2020-12-30 ENCOUNTER — Ambulatory Visit (INDEPENDENT_AMBULATORY_CARE_PROVIDER_SITE_OTHER): Payer: Medicare Other | Admitting: Family Medicine

## 2020-12-30 VITALS — BP 108/78 | HR 87 | Temp 97.5°F | Ht 63.0 in | Wt 90.4 lb

## 2020-12-30 DIAGNOSIS — F0281 Dementia in other diseases classified elsewhere with behavioral disturbance: Secondary | ICD-10-CM | POA: Diagnosis not present

## 2020-12-30 DIAGNOSIS — F05 Delirium due to known physiological condition: Secondary | ICD-10-CM | POA: Diagnosis not present

## 2020-12-30 DIAGNOSIS — G309 Alzheimer's disease, unspecified: Secondary | ICD-10-CM

## 2020-12-30 DIAGNOSIS — F411 Generalized anxiety disorder: Secondary | ICD-10-CM

## 2020-12-30 MED ORDER — SERTRALINE HCL 50 MG PO TABS: 50.0000 mg | ORAL_TABLET | Freq: Every day | ORAL | 6 refills | Status: DC

## 2020-12-30 NOTE — Progress Notes (Signed)
Patient ID: Kimberly Davies, female    DOB: 02/28/1941, 80 y.o.   MRN: [id number]  This visit was conducted in person.  BP 108/78 (BP Location: Left Arm, Patient Position: Sitting, Cuff Size: Normal)   Pulse 87   Temp (!) 97.5 F (36.4 C) (Temporal)   Ht 5\' 3"  (1.6 m)   Wt 90 lb 7 oz (41 kg)   SpO2 98%   BMI 16.02 kg/m    CC: 6 wk f/u visit  Subjective:   HPI: Kimberly Davies is a 80 y.o. female presenting on 12/30/2020 for Anxiety (Here for 6 wk f/u.  Pt accompanied by husband, [name]- temp 97.6.)   See prior note for details.  Progressive presumed alzheimer dementia over the past 2 years. Acute worsening this past month with sundowning with progressive agitation. Sertraline, lorazepam not very effective. We recently started seroquel 25mg  nightly PRN as well. This has seemed to help. From last visit, MMSE dropped from 23 --> 9/30. Neurology referral planned for next month.   Long term on namenda 10mg  bid. Previously trouble tolerating acetylcholinesterase inhibitor due to GI upset.   Recurrent DVT - continues eliquis 2.5mg  bid.   They have not recently been around family as family all contracted COVID - fortunately doing better.  Husband feels patient's appetite has improved - seroquel may have helped this.  No bowel/bladder incontinence.  Discussed walking routine as a way to stay physically active.      Relevant past medical, surgical, family and social history reviewed and updated as indicated. Interim medical history since our last visit reviewed. Allergies and medications reviewed and updated. Outpatient Medications Prior to Visit  Medication Sig Dispense Refill  . apixaban (ELIQUIS) 2.5 MG TABS tablet Take 1 tablet (2.5 mg total) by mouth 2 (two) times daily. 60 tablet 12  . Cholecalciferol (VITAMIN D) 50 MCG (2000 UT) CAPS Take 1 capsule (2,000 Units total) by mouth daily. 30 capsule   . LORazepam (ATIVAN) 0.5 MG tablet Take 1 tablet (0.5 mg total) by mouth daily as  needed for anxiety or sleep. 30 tablet 0  . memantine (NAMENDA) 10 MG tablet Take 1 tablet (10 mg total) by mouth 2 (two) times daily. Needs Office Visit 180 tablet 1  . QUEtiapine (SEROQUEL) 25 MG tablet Take 1 tablet (25 mg total) by mouth at bedtime. 30 tablet 1  . vitamin B-12 (CYANOCOBALAMIN) 1000 MCG tablet Take 1 tablet (1,000 mcg total) by mouth daily.    . vitamin E 1000 UNIT capsule Take 1 capsule (1,000 Units total) by mouth in the morning and at bedtime.    . sertraline (ZOLOFT) 25 MG tablet Take 1 tablet (25 mg total) by mouth daily. 30 tablet 6   No facility-administered medications prior to visit.     Per HPI unless specifically indicated in ROS section below Review of Systems Objective:  BP 108/78 (BP Location: Left Arm, Patient Position: Sitting, Cuff Size: Normal)   Pulse 87   Temp (!) 97.5 F (36.4 C) (Temporal)   Ht 5\' 3"  (1.6 m)   Wt 90 lb 7 oz (41 kg)   SpO2 98%   BMI 16.02 kg/m   Wt Readings from Last 3 Encounters:  12/30/20 90 lb 7 oz (41 kg)  11/18/20 92 lb 6 oz (41.9 kg)  08/15/20 102 lb 9.6 oz (46.5 kg)      Physical Exam Vitals and nursing note reviewed.  Constitutional:      Appearance: Normal appearance. She is  underweight. She is not ill-appearing.  Neck:     Thyroid: No thyroid mass or thyromegaly.  Cardiovascular:     Rate and Rhythm: Normal rate and regular rhythm.     Pulses: Normal pulses.     Heart sounds: Normal heart sounds. No murmur heard.   Pulmonary:     Effort: Pulmonary effort is normal. No respiratory distress.     Breath sounds: Normal breath sounds. No wheezing, rhonchi or rales.  Musculoskeletal:     Right lower leg: No edema.     Left lower leg: No edema.  Skin:    General: Skin is warm and dry.     Findings: No rash.  Neurological:     Mental Status: She is alert.  Psychiatric:        Mood and Affect: Mood normal.        Behavior: Behavior normal.       Results for orders placed or performed in visit on  11/18/20  TSH  Result Value Ref Range   TSH 2.31 0.35 - 4.50 uIU/mL  CBC with Differential/Platelet  Result Value Ref Range   WBC 8.2 4.0 - 10.5 K/uL   RBC 4.74 3.87 - 5.11 Mil/uL   Hemoglobin 13.6 12.0 - 15.0 g/dL   HCT 41.8 36.0 - 46.0 %   MCV 88.2 78.0 - 100.0 fl   MCHC 32.6 30.0 - 36.0 g/dL   RDW 14.6 11.5 - 15.5 %   Platelets 380.0 150.0 - 400.0 K/uL   Neutrophils Relative % 59.5 43.0 - 77.0 %   Lymphocytes Relative 29.5 12.0 - 46.0 %   Monocytes Relative 9.9 3.0 - 12.0 %   Eosinophils Relative 0.2 0.0 - 5.0 %   Basophils Relative 0.9 0.0 - 3.0 %   Neutro Abs 4.9 1.4 - 7.7 K/uL   Lymphs Abs 2.4 0.7 - 4.0 K/uL   Monocytes Absolute 0.8 0.1 - 1.0 K/uL   Eosinophils Absolute 0.0 0.0 - 0.7 K/uL   Basophils Absolute 0.1 0.0 - 0.1 K/uL  Vitamin B12  Result Value Ref Range   Vitamin B-12 877 211 - 911 pg/mL  Basic metabolic panel  Result Value Ref Range   Sodium 141 135 - 145 mEq/L   Potassium 3.7 3.5 - 5.1 mEq/L   Chloride 100 96 - 112 mEq/L   CO2 30 19 - 32 mEq/L   Glucose, Bld 87 70 - 99 mg/dL   BUN 9 6 - 23 mg/dL   Creatinine, Ser 0.77 0.40 - 1.20 mg/dL   GFR 74.64 >60.00 mL/min   Calcium 10.3 8.4 - 10.5 mg/dL   Assessment & Plan:  This visit occurred during the SARS-CoV-2 public health emergency.  Safety protocols were in place, including screening questions prior to the visit, additional usage of staff PPE, and extensive cleaning of exam room while observing appropriate contact time as indicated for disinfecting solutions.   Problem List Items Addressed This Visit    Sundowning    Discussed reorientation, discussed lorazepam use, seroquel use PRN.       Dementia ([place]) - Primary    Likely alzheimer's dementia with behavioral disturbance and sundowning - stable period on current regimen. Recently started on seroquel for night time agitation. Reviewed planned tapering in the future once need has resolved.  Keep upcoming neurology appointment.       Relevant  Medications   sertraline (ZOLOFT) 50 MG tablet   Anxiety state    Increase sertraline to 50mg  daily, continue lorazepam PRN.  Relevant Medications   sertraline (ZOLOFT) 50 MG tablet       Meds ordered this encounter  Medications  . sertraline (ZOLOFT) 50 MG tablet    Sig: Take 1 tablet (50 mg total) by mouth daily.    Dispense:  30 tablet    Refill:  6    Note new dose   No orders of the defined types were placed in this encounter.   Patient Instructions  I'm glad you're doing well today.  Increase sertraline to 50mg  in the morning.  Ok to continue lorazepam 0.5mg  daily as needed.  Continue seroquel 25mg  at night time, goal will be to use as needed, hopeful to taper off if doing well.  Return in 2-3 months for wellness visit.   Follow up plan: Return in about 3 months (around 03/30/2021) for medicare wellness visit, annual exam, prior fasting for blood work.  Ria Bush, MD

## 2020-12-30 NOTE — Assessment & Plan Note (Addendum)
Increase sertraline to 50mg  daily, continue lorazepam PRN.

## 2020-12-30 NOTE — Assessment & Plan Note (Addendum)
Likely alzheimer's dementia with behavioral disturbance and sundowning - stable period on current regimen. Recently started on seroquel for night time agitation. Reviewed planned tapering in the future once need has resolved.  Keep upcoming neurology appointment.

## 2020-12-30 NOTE — Assessment & Plan Note (Signed)
Discussed reorientation, discussed lorazepam use, seroquel use PRN.

## 2020-12-30 NOTE — Patient Instructions (Addendum)
I'm glad you're doing well today.  Increase sertraline to 50mg  in the morning.  Ok to continue lorazepam 0.5mg  daily as needed.  Continue seroquel 25mg  at night time, goal will be to use as needed, hopeful to taper off if doing well.  Return in 2-3 months for wellness visit.

## 2021-01-07 ENCOUNTER — Other Ambulatory Visit: Payer: Self-pay | Admitting: Family Medicine

## 2021-01-08 NOTE — Telephone Encounter (Signed)
Ok for 90 day rx.

## 2021-01-21 ENCOUNTER — Other Ambulatory Visit: Payer: Self-pay | Admitting: Family Medicine

## 2021-01-21 NOTE — Telephone Encounter (Signed)
Pharmacy requesting 90-day rx.  

## 2021-01-22 NOTE — Telephone Encounter (Signed)
ERx 

## 2021-01-30 ENCOUNTER — Inpatient Hospital Stay: Payer: Medicare Other | Admitting: Oncology

## 2021-01-30 ENCOUNTER — Encounter: Payer: Self-pay | Admitting: Oncology

## 2021-01-30 ENCOUNTER — Inpatient Hospital Stay: Payer: Medicare Other | Attending: Oncology

## 2021-01-30 ENCOUNTER — Other Ambulatory Visit: Payer: Self-pay

## 2021-01-30 VITALS — BP 120/72 | HR 74 | Temp 96.6°F | Resp 16 | Wt 93.7 lb

## 2021-01-30 DIAGNOSIS — Z823 Family history of stroke: Secondary | ICD-10-CM | POA: Insufficient documentation

## 2021-01-30 DIAGNOSIS — M79662 Pain in left lower leg: Secondary | ICD-10-CM | POA: Insufficient documentation

## 2021-01-30 DIAGNOSIS — Z885 Allergy status to narcotic agent status: Secondary | ICD-10-CM | POA: Insufficient documentation

## 2021-01-30 DIAGNOSIS — Z801 Family history of malignant neoplasm of trachea, bronchus and lung: Secondary | ICD-10-CM | POA: Insufficient documentation

## 2021-01-30 DIAGNOSIS — Z9049 Acquired absence of other specified parts of digestive tract: Secondary | ICD-10-CM | POA: Diagnosis not present

## 2021-01-30 DIAGNOSIS — Z880 Allergy status to penicillin: Secondary | ICD-10-CM | POA: Insufficient documentation

## 2021-01-30 DIAGNOSIS — Z7901 Long term (current) use of anticoagulants: Secondary | ICD-10-CM | POA: Diagnosis not present

## 2021-01-30 DIAGNOSIS — I8390 Asymptomatic varicose veins of unspecified lower extremity: Secondary | ICD-10-CM

## 2021-01-30 DIAGNOSIS — Z8249 Family history of ischemic heart disease and other diseases of the circulatory system: Secondary | ICD-10-CM | POA: Diagnosis not present

## 2021-01-30 DIAGNOSIS — I82402 Acute embolism and thrombosis of unspecified deep veins of left lower extremity: Secondary | ICD-10-CM

## 2021-01-30 DIAGNOSIS — F0390 Unspecified dementia without behavioral disturbance: Secondary | ICD-10-CM | POA: Insufficient documentation

## 2021-01-30 DIAGNOSIS — Z86718 Personal history of other venous thrombosis and embolism: Secondary | ICD-10-CM | POA: Insufficient documentation

## 2021-01-30 DIAGNOSIS — Z811 Family history of alcohol abuse and dependence: Secondary | ICD-10-CM | POA: Insufficient documentation

## 2021-01-30 DIAGNOSIS — Z79899 Other long term (current) drug therapy: Secondary | ICD-10-CM | POA: Insufficient documentation

## 2021-01-30 DIAGNOSIS — Z8049 Family history of malignant neoplasm of other genital organs: Secondary | ICD-10-CM | POA: Diagnosis not present

## 2021-01-30 DIAGNOSIS — Z833 Family history of diabetes mellitus: Secondary | ICD-10-CM | POA: Insufficient documentation

## 2021-01-30 LAB — COMPREHENSIVE METABOLIC PANEL
ALT: 32 U/L (ref 0–44)
AST: 37 U/L (ref 15–41)
Albumin: 4.2 g/dL (ref 3.5–5.0)
Alkaline Phosphatase: 68 U/L (ref 38–126)
Anion gap: 8 (ref 5–15)
BUN: 16 mg/dL (ref 8–23)
CO2: 32 mmol/L (ref 22–32)
Calcium: 9.7 mg/dL (ref 8.9–10.3)
Chloride: 102 mmol/L (ref 98–111)
Creatinine, Ser: 0.8 mg/dL (ref 0.44–1.00)
GFR, Estimated: >60 mL/min (ref >60)
Glucose, Bld: 89 mg/dL (ref 70–99)
Potassium: 4.1 mmol/L (ref 3.5–5.1)
Sodium: 142 mmol/L (ref 135–145)
Total Bilirubin: 0.8 mg/dL (ref 0.3–1.2)
Total Protein: 7.9 g/dL (ref 6.5–8.1)

## 2021-01-30 LAB — CBC WITH DIFFERENTIAL/PLATELET
Abs Immature Granulocytes: 0.02 10*3/uL (ref 0.00–0.07)
Basophils Absolute: 0.1 10*3/uL (ref 0.0–0.1)
Basophils Relative: 1 %
Eosinophils Absolute: 0.1 10*3/uL (ref 0.0–0.5)
Eosinophils Relative: 1 %
HCT: 40.3 % (ref 36.0–46.0)
Hemoglobin: 12.6 g/dL (ref 12.0–15.0)
Immature Granulocytes: 0 %
Lymphocytes Relative: 30 %
Lymphs Abs: 2.3 10*3/uL (ref 0.7–4.0)
MCH: 28.8 pg (ref 26.0–34.0)
MCHC: 31.3 g/dL (ref 30.0–36.0)
MCV: 92.2 fL (ref 80.0–100.0)
Monocytes Absolute: 0.7 10*3/uL (ref 0.1–1.0)
Monocytes Relative: 9 %
Neutro Abs: 4.6 10*3/uL (ref 1.7–7.7)
Neutrophils Relative %: 59 %
Platelets: 370 10*3/uL (ref 150–400)
RBC: 4.37 MIL/uL (ref 3.87–5.11)
RDW: 13.4 % (ref 11.5–15.5)
WBC: 7.7 10*3/uL (ref 4.0–10.5)
nRBC: 0 % (ref 0.0–0.2)

## 2021-01-30 MED ORDER — APIXABAN 2.5 MG PO TABS
2.5000 mg | ORAL_TABLET | Freq: Two times a day (BID) | ORAL | 12 refills | Status: DC
Start: 2021-01-30 — End: 2022-02-01
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
## 2021-01-30 NOTE — Progress Notes (Signed)
Patient denies new problems/concerns today.   °

## 2021-02-11 ENCOUNTER — Telehealth: Payer: Self-pay | Admitting: *Deleted

## 2021-02-11 NOTE — Telephone Encounter (Signed)
Dentist office called requesting clearance for be faxed to them or you can call them this morning. Patient is scheduled this morning for procedure. [phone]

## 2021-02-11 NOTE — Telephone Encounter (Signed)
Anticoagulation clearance request was received yesterday afternoon has been faxed back today.

## 2021-03-10 ENCOUNTER — Other Ambulatory Visit: Payer: Self-pay | Admitting: Family Medicine

## 2021-03-10 DIAGNOSIS — E782 Mixed hyperlipidemia: Secondary | ICD-10-CM

## 2021-03-10 DIAGNOSIS — E559 Vitamin D deficiency, unspecified: Secondary | ICD-10-CM

## 2021-03-10 DIAGNOSIS — Z1159 Encounter for screening for other viral diseases: Secondary | ICD-10-CM

## 2021-03-10 DIAGNOSIS — E538 Deficiency of other specified B group vitamins: Secondary | ICD-10-CM

## 2021-03-10 DIAGNOSIS — R7989 Other specified abnormal findings of blood chemistry: Secondary | ICD-10-CM

## 2021-03-25 ENCOUNTER — Other Ambulatory Visit (INDEPENDENT_AMBULATORY_CARE_PROVIDER_SITE_OTHER): Payer: Medicare Other

## 2021-03-25 ENCOUNTER — Other Ambulatory Visit: Payer: Self-pay

## 2021-03-25 DIAGNOSIS — E538 Deficiency of other specified B group vitamins: Secondary | ICD-10-CM

## 2021-03-25 DIAGNOSIS — E559 Vitamin D deficiency, unspecified: Secondary | ICD-10-CM

## 2021-03-25 DIAGNOSIS — E782 Mixed hyperlipidemia: Secondary | ICD-10-CM

## 2021-03-25 DIAGNOSIS — R7989 Other specified abnormal findings of blood chemistry: Secondary | ICD-10-CM | POA: Diagnosis not present

## 2021-03-25 DIAGNOSIS — Z1159 Encounter for screening for other viral diseases: Secondary | ICD-10-CM

## 2021-03-25 LAB — VITAMIN B12: Vitamin B-12: 1398 pg/mL — ABNORMAL HIGH (ref 211–911)

## 2021-03-25 LAB — LIPID PANEL
Cholesterol: 186 mg/dL (ref 0–200)
HDL: 58.8 mg/dL (ref >39.00)
LDL Cholesterol: 108 mg/dL — ABNORMAL HIGH (ref 0–99)
NonHDL: 127.63
Total CHOL/HDL Ratio: 3
Triglycerides: 96 mg/dL (ref 0.0–149.0)
VLDL: 19.2 mg/dL (ref 0.0–40.0)

## 2021-03-25 LAB — VITAMIN D 25 HYDROXY (VIT D DEFICIENCY, FRACTURES): VITD: 27.48 ng/mL — ABNORMAL LOW (ref 30.00–100.00)

## 2021-03-25 LAB — T4, FREE: Free T4: 0.6 ng/dL (ref 0.60–1.60)

## 2021-03-25 LAB — TSH: TSH: 3.64 u[IU]/mL (ref 0.35–4.50)

## 2021-03-26 LAB — HEPATITIS C ANTIBODY
Hepatitis C Ab: NONREACTIVE
SIGNAL TO CUT-OFF: 0.01 (ref <1.00)

## 2021-03-27 ENCOUNTER — Ambulatory Visit (INDEPENDENT_AMBULATORY_CARE_PROVIDER_SITE_OTHER): Payer: Medicare Other

## 2021-03-27 ENCOUNTER — Other Ambulatory Visit: Payer: Self-pay

## 2021-03-27 DIAGNOSIS — Z0000 Encounter for general adult medical examination without abnormal findings: Secondary | ICD-10-CM

## 2021-03-27 NOTE — Patient Instructions (Signed)
Kimberly Davies , Thank you for taking time to come for your Medicare Wellness Visit. I appreciate your ongoing commitment to your health goals. Please review the following plan we discussed and let me know if I can assist you in the future.   Screening recommendations/referrals: Colonoscopy: Up to date, completed 08/07/2013, due 08/2023 Mammogram: declined Bone Density: declined Recommended yearly ophthalmology/optometry visit for glaucoma screening and checkup Recommended yearly dental visit for hygiene and checkup  Vaccinations: Influenza vaccine: Up to date, completed 09/01/2020, due 07/2021 Pneumococcal vaccine: Completed series Tdap vaccine: decline-insuraance Shingles vaccine: due, check with your insurance regarding coverage if interested    Covid-19:Up to date, completed 10/28/2020, due 04/27/2021  Advanced directives: Advance directive discussed with you today. Even though you declined this today please call our office should you change your mind and we can give you the proper paperwork for you to fill out.   Conditions/risks identified: hyperlipidemia   Next appointment: Follow up in one year for your annual wellness visit    Preventive Care 65 Years and Older, Female Preventive care refers to lifestyle choices and visits with your health care provider that can promote health and wellness. What does preventive care include?  A yearly physical exam. This is also called an annual well check.  Dental exams once or twice a year.  Routine eye exams. Ask your health care provider how often you should have your eyes checked.  Personal lifestyle choices, including:  Daily care of your teeth and gums.  Regular physical activity.  Eating a healthy diet.  Avoiding tobacco and drug use.  Limiting alcohol use.  Practicing safe sex.  Taking low-dose aspirin every day.  Taking vitamin and mineral supplements as recommended by your health care provider. What happens during an  annual well check? The services and screenings done by your health care provider during your annual well check will depend on your age, overall health, lifestyle risk factors, and family history of disease. Counseling  Your health care provider may ask you questions about your:  Alcohol use.  Tobacco use.  Drug use.  Emotional well-being.  Home and relationship well-being.  Sexual activity.  Eating habits.  History of falls.  Memory and ability to understand (cognition).  Work and work Statistician.  Reproductive health. Screening  You may have the following tests or measurements:  Height, weight, and BMI.  Blood pressure.  Lipid and cholesterol levels. These may be checked every 5 years, or more frequently if you are over 71 years old.  Skin check.  Lung cancer screening. You may have this screening every year starting at age 30 if you have a 30-pack-year history of smoking and currently smoke or have quit within the past 15 years.  Fecal occult blood test (FOBT) of the stool. You may have this test every year starting at age 35.  Flexible sigmoidoscopy or colonoscopy. You may have a sigmoidoscopy every 5 years or a colonoscopy every 10 years starting at age 34.  Hepatitis C blood test.  Hepatitis B blood test.  Sexually transmitted disease (STD) testing.  Diabetes screening. This is done by checking your blood sugar (glucose) after you have not eaten for a while (fasting). You may have this done every 1-3 years.  Bone density scan. This is done to screen for osteoporosis. You may have this done starting at age 58.  Mammogram. This may be done every 1-2 years. Talk to your health care provider about how often you should have regular mammograms. Talk  with your health care provider about your test results, treatment options, and if necessary, the need for more tests. Vaccines  Your health care provider may recommend certain vaccines, such as:  Influenza  vaccine. This is recommended every year.  Tetanus, diphtheria, and acellular pertussis (Tdap, Td) vaccine. You may need a Td booster every 10 years.  Zoster vaccine. You may need this after age 64.  Pneumococcal 13-valent conjugate (PCV13) vaccine. One dose is recommended after age 11.  Pneumococcal polysaccharide (PPSV23) vaccine. One dose is recommended after age 68. Talk to your health care provider about which screenings and vaccines you need and how often you need them. This information is not intended to replace advice given to you by your health care provider. Make sure you discuss any questions you have with your health care provider. Document Released: 12/19/2015 Document Revised: 08/11/2016 Document Reviewed: 09/23/2015 Elsevier Interactive Patient Education  2017 [place] Prevention in the Home Falls can cause injuries. They can happen to people of all ages. There are many things you can do to make your home safe and to help prevent falls. What can I do on the outside of my home?  Regularly fix the edges of walkways and driveways and fix any cracks.  Remove anything that might make you trip as you walk through a door, such as a raised step or threshold.  Trim any bushes or trees on the path to your home.  Use bright outdoor lighting.  Clear any walking paths of anything that might make someone trip, such as rocks or tools.  Regularly check to see if handrails are loose or broken. Make sure that both sides of any steps have handrails.  Any raised decks and porches should have guardrails on the edges.  Have any leaves, snow, or ice cleared regularly.  Use sand or salt on walking paths during winter.  Clean up any spills in your garage right away. This includes oil or grease spills. What can I do in the bathroom?  Use night lights.  Install grab bars by the toilet and in the tub and shower. Do not use towel bars as grab bars.  Use non-skid mats or decals  in the tub or shower.  If you need to sit down in the shower, use a plastic, non-slip stool.  Keep the floor dry. Clean up any water that spills on the floor as soon as it happens.  Remove soap buildup in the tub or shower regularly.  Attach bath mats securely with double-sided non-slip rug tape.  Do not have throw rugs and other things on the floor that can make you trip. What can I do in the bedroom?  Use night lights.  Make sure that you have a light by your bed that is easy to reach.  Do not use any sheets or blankets that are too big for your bed. They should not hang down onto the floor.  Have a firm chair that has side arms. You can use this for support while you get dressed.  Do not have throw rugs and other things on the floor that can make you trip. What can I do in the kitchen?  Clean up any spills right away.  Avoid walking on wet floors.  Keep items that you use a lot in easy-to-reach places.  If you need to reach something above you, use a strong step stool that has a grab bar.  Keep electrical cords out of the way.  Do  not use floor polish or wax that makes floors slippery. If you must use wax, use non-skid floor wax.  Do not have throw rugs and other things on the floor that can make you trip. What can I do with my stairs?  Do not leave any items on the stairs.  Make sure that there are handrails on both sides of the stairs and use them. Fix handrails that are broken or loose. Make sure that handrails are as long as the stairways.  Check any carpeting to make sure that it is firmly attached to the stairs. Fix any carpet that is loose or worn.  Avoid having throw rugs at the top or bottom of the stairs. If you do have throw rugs, attach them to the floor with carpet tape.  Make sure that you have a light switch at the top of the stairs and the bottom of the stairs. If you do not have them, ask someone to add them for you. What else can I do to help  prevent falls?  Wear shoes that:  Do not have high heels.  Have rubber bottoms.  Are comfortable and fit you well.  Are closed at the toe. Do not wear sandals.  If you use a stepladder:  Make sure that it is fully opened. Do not climb a closed stepladder.  Make sure that both sides of the stepladder are locked into place.  Ask someone to hold it for you, if possible.  Clearly mark and make sure that you can see:  Any grab bars or handrails.  First and last steps.  Where the edge of each step is.  Use tools that help you move around (mobility aids) if they are needed. These include:  Canes.  Walkers.  Scooters.  Crutches.  Turn on the lights when you go into a dark area. Replace any light bulbs as soon as they burn out.  Set up your furniture so you have a clear path. Avoid moving your furniture around.  If any of your floors are uneven, fix them.  If there are any pets around you, be aware of where they are.  Review your medicines with your doctor. Some medicines can make you feel dizzy. This can increase your chance of falling. Ask your doctor what other things that you can do to help prevent falls. This information is not intended to replace advice given to you by your health care provider. Make sure you discuss any questions you have with your health care provider. Document Released: 09/18/2009 Document Revised: 04/29/2016 Document Reviewed: 12/27/2014 Elsevier Interactive Patient Education  2017 [employer].

## 2021-03-27 NOTE — Progress Notes (Signed)
PCP notes:  Health Maintenance: Tdap- insurance  Mammogram- declined Dexa- declined   Abnormal Screenings: none   Patient concerns: none   Nurse concerns: none   Next PCP appt.: 04/01/2021 @ 10 am
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
## 2021-04-01 ENCOUNTER — Encounter: Payer: Self-pay | Admitting: Family Medicine

## 2021-04-01 ENCOUNTER — Telehealth: Payer: Self-pay | Admitting: Family Medicine

## 2021-04-01 ENCOUNTER — Other Ambulatory Visit: Payer: Self-pay

## 2021-04-01 ENCOUNTER — Ambulatory Visit (INDEPENDENT_AMBULATORY_CARE_PROVIDER_SITE_OTHER): Payer: Medicare Other | Admitting: Family Medicine

## 2021-04-01 VITALS — BP 132/84 | HR 69 | Temp 97.9°F | Ht 61.5 in | Wt 93.4 lb

## 2021-04-01 DIAGNOSIS — Z7189 Other specified counseling: Secondary | ICD-10-CM | POA: Diagnosis not present

## 2021-04-01 DIAGNOSIS — I82402 Acute embolism and thrombosis of unspecified deep veins of left lower extremity: Secondary | ICD-10-CM

## 2021-04-01 DIAGNOSIS — F411 Generalized anxiety disorder: Secondary | ICD-10-CM

## 2021-04-01 DIAGNOSIS — E559 Vitamin D deficiency, unspecified: Secondary | ICD-10-CM

## 2021-04-01 DIAGNOSIS — E538 Deficiency of other specified B group vitamins: Secondary | ICD-10-CM

## 2021-04-01 DIAGNOSIS — Z515 Encounter for palliative care: Secondary | ICD-10-CM | POA: Insufficient documentation

## 2021-04-01 DIAGNOSIS — G309 Alzheimer's disease, unspecified: Secondary | ICD-10-CM

## 2021-04-01 DIAGNOSIS — M810 Age-related osteoporosis without current pathological fracture: Secondary | ICD-10-CM

## 2021-04-01 DIAGNOSIS — Z0001 Encounter for general adult medical examination with abnormal findings: Secondary | ICD-10-CM | POA: Insufficient documentation

## 2021-04-01 DIAGNOSIS — F0281 Dementia in other diseases classified elsewhere with behavioral disturbance: Secondary | ICD-10-CM

## 2021-04-01 DIAGNOSIS — E782 Mixed hyperlipidemia: Secondary | ICD-10-CM

## 2021-04-01 DIAGNOSIS — R7989 Other specified abnormal findings of blood chemistry: Secondary | ICD-10-CM

## 2021-04-01 MED ORDER — ALENDRONATE SODIUM 70 MG PO TABS: 70.0000 mg | ORAL_TABLET | ORAL | 3 refills | Status: DC

## 2021-04-01 MED ORDER — MEMANTINE HCL 10 MG PO TABS
10.0000 mg | ORAL_TABLET | Freq: Two times a day (BID) | ORAL | 3 refills | Status: DC
Start: 2021-04-01 — End: 2022-04-05

## 2021-04-01 MED ORDER — VITAMIN B-12 1000 MCG PO TABS: 1000.0000 ug | ORAL_TABLET | ORAL | Status: DC

## 2021-04-01 NOTE — Assessment & Plan Note (Signed)
Levels have dropped. rec daily 2000 IU vit D supplement.  If staying low despite replacement, consider restarting weekly Rx dose.

## 2021-04-01 NOTE — Telephone Encounter (Signed)
Can we price out prolia for patient?  May speak with husband as pt with dementia.

## 2021-04-01 NOTE — Assessment & Plan Note (Signed)
Currently off medication. Discussed options. Reviewed previous fosamax use - will restart this. Will also price out prolia to help inform their decision for ongoing osteoporosis medication.  Encouraged regular weight bearing exercise, calcium, vit D use.

## 2021-04-01 NOTE — Assessment & Plan Note (Signed)
Stable period.  

## 2021-04-01 NOTE — Assessment & Plan Note (Signed)
Continue sertraline 50 mg daily

## 2021-04-01 NOTE — Progress Notes (Signed)
Patient ID: Kimberly Davies, female    DOB: 25-May-1941, 80 y.o.   MRN: [id number]  This visit was conducted in person.  BP 132/84   Pulse 69   Temp 97.9 F (36.6 C) (Temporal)   Ht 5' 1.5" (1.562 m)   Wt 93 lb 6 oz (42.4 kg)   SpO2 99%   BMI 17.36 kg/m    CC: CPE  Subjective:   HPI: Kimberly Davies is a 80 y.o. female presenting on 04/01/2021 for Annual Exam (Prt 2.  Pt accompanied by husband, [name]- temp 97.7.)   Saw health advisor last week for medicare wellness visit. Note reviewed.   No exam data present  Flowsheet Row Clinical Support from 03/27/2021 in [facility]  PHQ-2 Total Score 0      Fall Risk  03/27/2021 05/03/2019 04/17/2018 04/13/2017 03/22/2016  Falls in the past year? 0 0 No No (No Data)  Comment - - - - tripped on robe tie; injury to left rotator cuff  Number falls in past yr: 0 - - - -  Injury with Fall? 0 - - - -  Risk for fall due to : Medication side effect - - - -  Follow up Falls evaluation completed;Falls prevention discussed - - - -    Recurrent DVT on lifelong low dose eliquis.   Dementia - established with neurology Dr Melrose Nakayama - continues namenda 10mg  bid with seroquel 25mg  nightly for sundowning. Ongoing anxiety, on sertraline 50mg  daily.   Preventative: COLONOSCOPY Date: 08/2013 small diverticula o/w normal, rpt 10 yrs Ardis Hughs)  Well woman exam - aged out - no fmhx cervical cancer. Mammo -12/2017 [facility] - discussed rpt vs age out - they will consider Dexa Date: 11/2016 T score spine -2.7 and hip -2.9.  Lung cancer screening - not eligible  Flu shot yearly  COVID vaccine Pfizer 01/2020, 02/2020, booster 10/2020 Pneumovax 2011, prevnar2015 Tetanus 2009. Zostavax 2012 Shingrix discussed.  Advanced directives: think they have info at home - will check. Husband and sons are POA.  Seat belt use discussed Sunscreen use discussed. No changing moles on skin.  Non smoker  Alcohol - none  Eye exam yearly Dentist - q6  mo Bladder - no urinary incontinence.  Bowels - no constipation   Caffeine: none Lives with husband, 2 grown children. 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 Interim medical history since our last visit reviewed. Allergies and medications reviewed and updated. Outpatient Medications Prior to Visit  Medication Sig Dispense Refill  . apixaban (ELIQUIS) 2.5 MG TABS tablet Take 1 tablet (2.5 mg total) by mouth 2 (two) times daily. 60 tablet 12  . Cholecalciferol (VITAMIN D) 50 MCG (2000 UT) CAPS Take 1 capsule (2,000 Units total) by mouth daily. 30 capsule   . LORazepam (ATIVAN) 0.5 MG tablet Take 1 tablet (0.5 mg total) by mouth daily as needed for anxiety or sleep. 30 tablet 0  . QUEtiapine (SEROQUEL) 25 MG tablet TAKE 1 TABLET BY MOUTH EVERYDAY AT BEDTIME 90 tablet 1  . sertraline (ZOLOFT) 50 MG tablet Take 1 tablet (50 mg total) by mouth daily. 90 tablet 3  . memantine (NAMENDA) 10 MG tablet Take 1 tablet (10 mg total) by mouth 2 (two) times daily. Needs Office Visit 180 tablet 1  .  vitamin B-12 (CYANOCOBALAMIN) 1000 MCG tablet Take 1 tablet (1,000 mcg total) by mouth daily.    . vitamin E 1000 UNIT capsule Take 1 capsule (1,000 Units total) by mouth in the morning and at bedtime. (Patient not taking: Reported on 04/01/2021)     No facility-administered medications prior to visit.     Per HPI unless specifically indicated in ROS section below Review of Systems  Constitutional: Negative for activity change, appetite change, chills, fatigue, fever and unexpected weight change.  HENT: Negative for hearing loss.   Eyes: Negative for visual disturbance.  Respiratory: Negative for cough, chest tightness, shortness of breath and wheezing.   Cardiovascular: Negative for chest pain, palpitations and  leg swelling.  Gastrointestinal: Negative for abdominal distention, abdominal pain, blood in stool, constipation, diarrhea, nausea and vomiting.  Genitourinary: Negative for difficulty urinating and hematuria.  Musculoskeletal: Negative for arthralgias, myalgias and neck pain.  Skin: Negative for rash.  Neurological: Negative for dizziness, seizures, syncope and headaches.  Hematological: Negative for adenopathy. Does not bruise/bleed easily.  Psychiatric/Behavioral: Negative for dysphoric mood. The patient is nervous/anxious.    Objective:  BP 132/84   Pulse 69   Temp 97.9 F (36.6 C) (Temporal)   Ht 5' 1.5" (1.562 m)   Wt 93 lb 6 oz (42.4 kg)   SpO2 99%   BMI 17.36 kg/m   Wt Readings from Last 3 Encounters:  04/01/21 93 lb 6 oz (42.4 kg)  01/30/21 93 lb 11.2 oz (42.5 kg)  12/30/20 90 lb 7 oz (41 kg)      Physical Exam Vitals and nursing note reviewed.  Constitutional:      General: She is not in acute distress.    Appearance: Normal appearance. She is well-developed. She is not ill-appearing.  HENT:     Head: Normocephalic and atraumatic.     Right Ear: Hearing, tympanic membrane, ear canal and external ear normal.     Left Ear: Hearing, tympanic membrane, ear canal and external ear normal.  Eyes:     General: No scleral icterus.    Extraocular Movements: Extraocular movements intact.     Conjunctiva/sclera: Conjunctivae normal.     Pupils: Pupils are equal, round, and reactive to light.  Neck:     Thyroid: No thyroid mass or thyromegaly.     Vascular: No carotid bruit.  Cardiovascular:     Rate and Rhythm: Normal rate and regular rhythm.     Pulses: Normal pulses.          Radial pulses are 2+ on the right side and 2+ on the left side.     Heart sounds: Normal heart sounds. No murmur heard.   Pulmonary:     Effort: Pulmonary effort is normal. No respiratory distress.     Breath sounds: Normal breath sounds. No wheezing, rhonchi or rales.  Abdominal:      General: Abdomen is flat. Bowel sounds are normal. There is no distension.     Palpations: Abdomen is soft. There is no mass.     Tenderness: There is no abdominal tenderness. There is no guarding or rebound.     Hernia: No hernia is present.  Musculoskeletal:        General: Normal range of motion.     Cervical back: Normal range of motion and neck supple.     Right lower leg: No edema.     Left lower leg: No edema.  Lymphadenopathy:     Cervical: No cervical adenopathy.  Skin:  General: Skin is warm and dry.     Findings: No rash.  Neurological:     General: No focal deficit present.     Mental Status: She is alert and oriented to person, place, and time.     Comments: CN grossly intact, station and gait intact  Psychiatric:        Mood and Affect: Mood normal.        Behavior: Behavior normal.        Thought Content: Thought content normal.        Judgment: Judgment normal.       Results for orders placed or performed in visit on 03/25/21  T4, free  Result Value Ref Range   Free T4 0.60 0.60 - 1.60 ng/dL  Hepatitis C antibody  Result Value Ref Range   Hepatitis C Ab NON-REACTIVE NON-REACTI   SIGNAL TO CUT-OFF 0.01 <1.00  Vitamin B12  Result Value Ref Range   Vitamin B-12 1,398 (H) 211 - 911 pg/mL  VITAMIN D 25 Hydroxy (Vit-D Deficiency, Fractures)  Result Value Ref Range   VITD 27.48 (L) 30.00 - 100.00 ng/mL  TSH  Result Value Ref Range   TSH 3.64 0.35 - 4.50 uIU/mL  Lipid panel  Result Value Ref Range   Cholesterol 186 0 - 200 mg/dL   Triglycerides 96.0 0.0 - 149.0 mg/dL   HDL 58.80 >39.00 mg/dL   VLDL 19.2 0.0 - 40.0 mg/dL   LDL Cholesterol 108 (H) 0 - 99 mg/dL   Total CHOL/HDL Ratio 3    NonHDL 127.63    Assessment & Plan:  This visit occurred during the SARS-CoV-2 public health emergency.  Safety protocols were in place, including screening questions prior to the visit, additional usage of staff PPE, and extensive cleaning of exam room while observing  appropriate contact time as indicated for disinfecting solutions.   Problem List Items Addressed This Visit    HLD (hyperlipidemia)    Chronic, stable off medications. Overall improving. Discussed diet choices to improve LDL. The 10-year ASCVD risk score [name], et al., 2013) is: 20.9%   Values used to calculate the score:     Age: 12 years     Sex: Female     Is Non-Hispanic African American: No     Diabetic: No     Tobacco smoker: No     Systolic Blood Pressure: [id number] mmHg     Is BP treated: No     HDL Cholesterol: 58.8 mg/dL     Total Cholesterol: 186 mg/dL       Anxiety state    Continue sertraline 50mg  daily.       Osteoporosis    Currently off medication. Discussed options. Reviewed previous fosamax use - will restart this. Will also price out prolia to help inform their decision for ongoing osteoporosis medication.  Encouraged regular weight bearing exercise, calcium, vit D use.       Relevant Medications   alendronate (FOSAMAX) 70 MG tablet   Vitamin D deficiency    Levels have dropped. rec daily 2000 IU vit D supplement.  If staying low despite replacement, consider restarting weekly Rx dose.       Vitamin B12 deficiency    Levels now too high - will drop b12 to MWF dosing.       Abnormal TSH    Stable period.       Advanced care planning/counseling discussion    Advanced directives: think they have info at home -  will check. Husband and sons are POA.       Alzheimer's dementia [facility])    Established with neurology - appreciate their care.  Support provided to husband who is primary caregive.r       Relevant Medications   memantine (NAMENDA) 10 MG tablet   Recurrent acute deep vein thrombosis (DVT) of left lower extremity (HCC)    Continue eliquis 2.5mg  bid. Sees heme      Encounter for general adult medical examination with abnormal findings - Primary    Preventative protocols reviewed and updated unless pt declined. Discussed healthy diet and  lifestyle.           Meds ordered this encounter  Medications  . vitamin B-12 (CYANOCOBALAMIN) 1000 MCG tablet    Sig: Take 1 tablet (1,000 mcg total) by mouth every Monday, Wednesday, and Friday.  Marland Kitchen alendronate (FOSAMAX) 70 MG tablet    Sig: Take 1 tablet (70 mg total) by mouth every 7 (seven) days. Take with a full glass of water on an empty stomach.    Dispense:  4 tablet    Refill:  3  . memantine (NAMENDA) 10 MG tablet    Sig: Take 1 tablet (10 mg total) by mouth 2 (two) times daily.    Dispense:  180 tablet    Refill:  3   No orders of the defined types were placed in this encounter.   Patient instructions: If interested, call to schedule mammogram at your convenience: [facility] [phone] If you do decide to do mammogram, let me know and I will order bone density scan to be done on same day.  Restart fosamax once weekly pill for osteoporosis. Take with large glass of water on an empty stomach, separate from other medicines by at least a few hours. We will price out prolia injection (every 6 month shot).  Consider shingrix vaccine - if interested, check with pharmacy about new 2 shot shingles series (shingrix).  Check at home on advanced directives, health care power of attorney and if you do have this, bring [place] a copy to update your chart.  Return as needed or in 6 months for follow up visit.   Follow up plan: Return in about 1 year (around 04/01/2022), or if symptoms worsen or fail to improve.  Ria Bush, MD

## 2021-04-01 NOTE — Assessment & Plan Note (Signed)
Advanced directives: think they have info at home - will check. Husband and sons are POA.  

## 2021-04-01 NOTE — Assessment & Plan Note (Signed)
Chronic, stable off medications. Overall improving. Discussed diet choices to improve LDL. The 10-year ASCVD risk score [name], et al., 2013) is: 20.9%   Values used to calculate the score:     Age: 79 years     Sex: Female     Is Non-Hispanic African American: No     Diabetic: No     Tobacco smoker: No     Systolic Blood Pressure: 115 mmHg     Is BP treated: No     HDL Cholesterol: 58.8 mg/dL     Total Cholesterol: 186 mg/dL

## 2021-04-01 NOTE — Assessment & Plan Note (Signed)
Levels now too high - will drop b12 to MWF dosing.

## 2021-04-01 NOTE — Assessment & Plan Note (Signed)
Established with neurology - appreciate their care.  Support provided to husband who is primary caregive.r

## 2021-04-01 NOTE — Patient Instructions (Addendum)
If interested, call to schedule mammogram at your convenience: [facility] [phone] If you do decide to do mammogram, let me know and I will order bone density scan to be done on same day.  Restart fosamax once weekly pill for osteoporosis. Take with large glass of water on an empty stomach, separate from other medicines by at least a few hours. We will price out prolia injection (every 6 month shot).  Consider shingrix vaccine - if interested, check with pharmacy about new 2 shot shingles series (shingrix).  Check at home on advanced directives, health care power of attorney and if you do have this, bring [place] a copy to update your chart.  Return as needed or in 6 months for follow up visit.   4 core lifestyle modifications to support a healthy mind:  1. Nutritious well balance diet.  2. Regular physical activity routine.  3. Regular mental activity such as reading books, word puzzles, math puzzles, jigsaw puzzles.  4. Social engagement.  Also ensure good blood pressure control, limit alcohol, no smoking.    Health Maintenance After Age 37 After age 6, you are at a higher risk for certain long-term diseases and infections as well as injuries from falls. Falls are a major cause of broken bones and head injuries in people who are older than age 4. Getting regular preventive care can help to keep you healthy and well. Preventive care includes getting regular testing and making lifestyle changes as recommended by your health care provider. Talk with your health care provider about:  Which screenings and tests you should have. A screening is a test that checks for a disease when you have no symptoms.  A diet and exercise plan that is right for you. What should I know about screenings and tests to prevent falls? Screening and testing are the best ways to find a health problem early. Early diagnosis and treatment give you the best chance of managing medical conditions that are  common after age 29. Certain conditions and lifestyle choices may make you more likely to have a fall. Your health care provider may recommend:  Regular vision checks. Poor vision and conditions such as cataracts can make you more likely to have a fall. If you wear glasses, make sure to get your prescription updated if your vision changes.  Medicine review. Work with your health care provider to regularly review all of the medicines you are taking, including over-the-counter medicines. Ask your health care provider about any side effects that may make you more likely to have a fall. Tell your health care provider if any medicines that you take make you feel dizzy or sleepy.  Osteoporosis screening. Osteoporosis is a condition that causes the bones to get weaker. This can make the bones weak and cause them to break more easily.  Blood pressure screening. Blood pressure changes and medicines to control blood pressure can make you feel dizzy.  Strength and balance checks. Your health care provider may recommend certain tests to check your strength and balance while standing, walking, or changing positions.  Foot health exam. Foot pain and numbness, as well as not wearing proper footwear, can make you more likely to have a fall.  Depression screening. You may be more likely to have a fall if you have a fear of falling, feel emotionally low, or feel unable to do activities that you used to do.  Alcohol use screening. Using too much alcohol can affect your balance and may  make you more likely to have a fall. What actions can I take to lower my risk of falls? General instructions  Talk with your health care provider about your risks for falling. Tell your health care provider if: ? You fall. Be sure to tell your health care provider about all falls, even ones that seem minor. ? You feel dizzy, sleepy, or off-balance.  Take over-the-counter and prescription medicines only as told by your health care  provider. These include any supplements.  Eat a healthy diet and maintain a healthy weight. A healthy diet includes low-fat dairy products, low-fat (lean) meats, and fiber from whole grains, beans, and lots of fruits and vegetables. Home safety  Remove any tripping hazards, such as rugs, cords, and clutter.  Install safety equipment such as grab bars in bathrooms and safety rails on stairs.  Keep rooms and walkways well-lit. Activity  Follow a regular exercise program to stay fit. This will help you maintain your balance. Ask your health care provider what types of exercise are appropriate for you.  If you need a cane or walker, use it as recommended by your health care provider.  Wear supportive shoes that have nonskid soles.   Lifestyle  Do not drink alcohol if your health care provider tells you not to drink.  If you drink alcohol, limit how much you have: ? 0-1 drink a day for women. ? 0-2 drinks a day for men.  Be aware of how much alcohol is in your drink. In the [place] one drink equals one typical bottle of beer (12 oz), one-half glass of wine (5 oz), or one shot of hard liquor (1 oz).  Do not use any products that contain nicotine or tobacco, such as cigarettes and e-cigarettes. If you need help quitting, ask your health care provider. Summary  Having a healthy lifestyle and getting preventive care can help to protect your health and wellness after age 14.  Screening and testing are the best way to find a health problem early and help you avoid having a fall. Early diagnosis and treatment give you the best chance for managing medical conditions that are more common for people who are older than age 63.  Falls are a major cause of broken bones and head injuries in people who are older than age 57. Take precautions to prevent a fall at home.  Work with your health care provider to learn what changes you can make to improve your health and wellness and to prevent falls. This  information is not intended to replace advice given to you by your health care provider. Make sure you discuss any questions you have with your health care provider. Document Revised: 03/15/2019 Document Reviewed: 10/05/2017 Elsevier Patient Education  2021 [employer].

## 2021-04-01 NOTE — Assessment & Plan Note (Addendum)
Continue eliquis 2.5mg  bid. Sees heme

## 2021-04-01 NOTE — Assessment & Plan Note (Signed)
Preventative protocols reviewed and updated unless pt declined. Discussed healthy diet and lifestyle.  

## 2021-04-03 NOTE — Telephone Encounter (Signed)
Benefit verification submitted -pending decision (new start) 

## 2021-04-09 NOTE — Addendum Note (Signed)
Addended by: Kris Mouton on: 04/09/2021 10:32 AM   Modules accepted: Orders

## 2021-04-09 NOTE — Telephone Encounter (Signed)
Noted! Thank you

## 2021-04-09 NOTE — Telephone Encounter (Signed)
Benefits received. OOP cost is $255. Spoke with patient's husband and patient. They want to proceed. Labs ordered. Lab appt on 04/13/21 and [facility] appt on 04/22/21.  FYI to Dr Darnell Level.

## 2021-04-13 ENCOUNTER — Other Ambulatory Visit (INDEPENDENT_AMBULATORY_CARE_PROVIDER_SITE_OTHER): Payer: Medicare Other

## 2021-04-13 ENCOUNTER — Other Ambulatory Visit: Payer: Self-pay

## 2021-04-13 DIAGNOSIS — M810 Age-related osteoporosis without current pathological fracture: Secondary | ICD-10-CM

## 2021-04-13 LAB — BASIC METABOLIC PANEL
BUN: 14 mg/dL (ref 6–23)
CO2: 34 mEq/L — ABNORMAL HIGH (ref 19–32)
Calcium: 9.8 mg/dL (ref 8.4–10.5)
Chloride: 103 mEq/L (ref 96–112)
Creatinine, Ser: 0.76 mg/dL (ref 0.40–1.20)
GFR: 75.61 mL/min (ref >60.00)
Glucose, Bld: 85 mg/dL (ref 70–99)
Potassium: 3.7 mEq/L (ref 3.5–5.1)
Sodium: 143 mEq/L (ref 135–145)

## 2021-04-15 NOTE — Telephone Encounter (Signed)
GFR is 75 - ok to proceed.

## 2021-04-15 NOTE — Telephone Encounter (Signed)
Creatinine Clearance result is 41.49 mL/min, under the recommended 60 and above level. Is it still ok to proceed with Prolia injection?

## 2021-04-15 NOTE — Telephone Encounter (Signed)
Noted! Thank you

## 2021-04-22 ENCOUNTER — Other Ambulatory Visit: Payer: Self-pay

## 2021-04-22 ENCOUNTER — Ambulatory Visit (INDEPENDENT_AMBULATORY_CARE_PROVIDER_SITE_OTHER): Payer: Medicare Other

## 2021-04-22 DIAGNOSIS — M810 Age-related osteoporosis without current pathological fracture: Secondary | ICD-10-CM | POA: Diagnosis not present

## 2021-04-22 MED ORDER — DENOSUMAB 60 MG/ML ~~LOC~~ SOSY
60.0000 mg | PREFILLED_SYRINGE | Freq: Once | SUBCUTANEOUS | Status: AC
  Administered 2021-04-22: 60 mg via SUBCUTANEOUS

## 2021-04-22 NOTE — Progress Notes (Signed)
Patient presented for Prolia injection SQ to Right arm given by Sherrilee Gilles, CMA. Patient tolerated injection well.

## 2021-06-12 ENCOUNTER — Ambulatory Visit (INDEPENDENT_AMBULATORY_CARE_PROVIDER_SITE_OTHER): Payer: Medicare Other

## 2021-06-12 ENCOUNTER — Ambulatory Visit
Admission: EM | Admit: 2021-06-12 | Discharge: 2021-06-12 | Disposition: A | Discharge status: Home / Self Care | Payer: Medicare Other | Attending: Emergency Medicine | Admitting: Emergency Medicine

## 2021-06-12 ENCOUNTER — Other Ambulatory Visit: Payer: Self-pay

## 2021-06-12 DIAGNOSIS — R079 Chest pain, unspecified: Secondary | ICD-10-CM | POA: Diagnosis not present

## 2021-06-12 DIAGNOSIS — S20211A Contusion of right front wall of thorax, initial encounter: Secondary | ICD-10-CM

## 2021-06-12 MED ORDER — DICLOFENAC SODIUM 1 % EX GEL: 2.0000 g | Freq: Four times a day (QID) | CUTANEOUS | 0 refills | Status: DC

## 2021-06-12 NOTE — ED Triage Notes (Signed)
Pt has dementia. The reports is coming mostly from her husband. He reports that he heard a a loud crash this morning and when he went to check on the PT, she was in the closet with one shoe on leaned against the wall. He supposes that while trying to put on her shoes that she lost balance and fell against the wall. Pt did not fall to the floor. She reports that she "hit it hard", but cannot demonstrate nor tell where the pain is. Husband notes that Pt has been holding and guarding the right side of her abdomen and walking with an unbalanced gait. No swelling or bruising noted.

## 2021-06-12 NOTE — Discharge Instructions (Addendum)
X-ray negative for fractures Tylenol [phone] mg every 4-6 hours May use diclofenac topically over side Please go to emergency room if developing any changes in mobility, alertness, speech, changes from baseline, one-sided weakness, abdominal pain or bruising
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
## 2021-06-22 ENCOUNTER — Other Ambulatory Visit: Payer: Self-pay | Admitting: Family Medicine

## 2021-06-22 NOTE — Telephone Encounter (Signed)
Last OV - 04/01/2021 Next OV - 10/02/2021 Last Filled - 01/08/2021

## 2021-06-24 NOTE — Telephone Encounter (Signed)
ERx 

## 2021-07-30 ENCOUNTER — Inpatient Hospital Stay: Payer: Medicare Other | Attending: Oncology

## 2021-07-30 ENCOUNTER — Other Ambulatory Visit: Payer: Self-pay

## 2021-07-30 ENCOUNTER — Encounter: Payer: Self-pay | Admitting: Nurse Practitioner

## 2021-07-30 ENCOUNTER — Inpatient Hospital Stay (HOSPITAL_BASED_OUTPATIENT_CLINIC_OR_DEPARTMENT_OTHER): Payer: Medicare Other | Admitting: Nurse Practitioner

## 2021-07-30 VITALS — BP 119/66 | HR 70 | Temp 96.6°F | Wt 102.6 lb

## 2021-07-30 DIAGNOSIS — Z7901 Long term (current) use of anticoagulants: Secondary | ICD-10-CM | POA: Insufficient documentation

## 2021-07-30 DIAGNOSIS — Z79899 Other long term (current) drug therapy: Secondary | ICD-10-CM | POA: Diagnosis not present

## 2021-07-30 DIAGNOSIS — Z801 Family history of malignant neoplasm of trachea, bronchus and lung: Secondary | ICD-10-CM | POA: Diagnosis not present

## 2021-07-30 DIAGNOSIS — M79662 Pain in left lower leg: Secondary | ICD-10-CM | POA: Diagnosis not present

## 2021-07-30 DIAGNOSIS — Z880 Allergy status to penicillin: Secondary | ICD-10-CM | POA: Diagnosis not present

## 2021-07-30 DIAGNOSIS — Z806 Family history of leukemia: Secondary | ICD-10-CM | POA: Diagnosis not present

## 2021-07-30 DIAGNOSIS — Z8249 Family history of ischemic heart disease and other diseases of the circulatory system: Secondary | ICD-10-CM | POA: Diagnosis not present

## 2021-07-30 DIAGNOSIS — Z885 Allergy status to narcotic agent status: Secondary | ICD-10-CM | POA: Diagnosis not present

## 2021-07-30 DIAGNOSIS — I82402 Acute embolism and thrombosis of unspecified deep veins of left lower extremity: Secondary | ICD-10-CM

## 2021-07-30 DIAGNOSIS — Z811 Family history of alcohol abuse and dependence: Secondary | ICD-10-CM | POA: Diagnosis not present

## 2021-07-30 DIAGNOSIS — F0390 Unspecified dementia without behavioral disturbance: Secondary | ICD-10-CM | POA: Diagnosis not present

## 2021-07-30 DIAGNOSIS — Z8052 Family history of malignant neoplasm of bladder: Secondary | ICD-10-CM | POA: Insufficient documentation

## 2021-07-30 DIAGNOSIS — Z833 Family history of diabetes mellitus: Secondary | ICD-10-CM | POA: Insufficient documentation

## 2021-07-30 DIAGNOSIS — Z9049 Acquired absence of other specified parts of digestive tract: Secondary | ICD-10-CM | POA: Insufficient documentation

## 2021-07-30 DIAGNOSIS — M810 Age-related osteoporosis without current pathological fracture: Secondary | ICD-10-CM | POA: Insufficient documentation

## 2021-07-30 DIAGNOSIS — E785 Hyperlipidemia, unspecified: Secondary | ICD-10-CM | POA: Insufficient documentation

## 2021-07-30 DIAGNOSIS — Z823 Family history of stroke: Secondary | ICD-10-CM | POA: Diagnosis not present

## 2021-07-30 DIAGNOSIS — Z86718 Personal history of other venous thrombosis and embolism: Secondary | ICD-10-CM | POA: Insufficient documentation

## 2021-07-30 LAB — CBC WITH DIFFERENTIAL/PLATELET
Abs Immature Granulocytes: 0.01 10*3/uL (ref 0.00–0.07)
Basophils Absolute: 0.1 10*3/uL (ref 0.0–0.1)
Basophils Relative: 1 %
Eosinophils Absolute: 0.2 10*3/uL (ref 0.0–0.5)
Eosinophils Relative: 3 %
HCT: 39 % (ref 36.0–46.0)
Hemoglobin: 12.2 g/dL (ref 12.0–15.0)
Immature Granulocytes: 0 %
Lymphocytes Relative: 35 %
Lymphs Abs: 2.7 10*3/uL (ref 0.7–4.0)
MCH: 28.9 pg (ref 26.0–34.0)
MCHC: 31.3 g/dL (ref 30.0–36.0)
MCV: 92.4 fL (ref 80.0–100.0)
Monocytes Absolute: 0.9 10*3/uL (ref 0.1–1.0)
Monocytes Relative: 12 %
Neutro Abs: 3.9 10*3/uL (ref 1.7–7.7)
Neutrophils Relative %: 49 %
Platelets: 348 10*3/uL (ref 150–400)
RBC: 4.22 MIL/uL (ref 3.87–5.11)
RDW: 13.1 % (ref 11.5–15.5)
WBC: 7.8 10*3/uL (ref 4.0–10.5)
nRBC: 0 % (ref 0.0–0.2)

## 2021-07-30 LAB — COMPREHENSIVE METABOLIC PANEL
ALT: 23 U/L (ref 0–44)
AST: 28 U/L (ref 15–41)
Albumin: 4.2 g/dL (ref 3.5–5.0)
Alkaline Phosphatase: 67 U/L (ref 38–126)
Anion gap: 7 (ref 5–15)
BUN: 15 mg/dL (ref 8–23)
CO2: 31 mmol/L (ref 22–32)
Calcium: 9.5 mg/dL (ref 8.9–10.3)
Chloride: 101 mmol/L (ref 98–111)
Creatinine, Ser: 0.72 mg/dL (ref 0.44–1.00)
GFR, Estimated: >60 mL/min (ref >60)
Glucose, Bld: 87 mg/dL (ref 70–99)
Potassium: 4.3 mmol/L (ref 3.5–5.1)
Sodium: 139 mmol/L (ref 135–145)
Total Bilirubin: 0.7 mg/dL (ref 0.3–1.2)
Total Protein: 8 g/dL (ref 6.5–8.1)
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
## 2021-09-24 ENCOUNTER — Telehealth: Payer: Self-pay

## 2021-09-24 NOTE — Telephone Encounter (Addendum)
Benefits submitted-pending,  Next injection due after 10/24/21  Lab done on 10/02/21 CrCl 44.99 mL/min Calcium normal 9.9

## 2021-10-02 ENCOUNTER — Encounter: Payer: Self-pay | Admitting: Family Medicine

## 2021-10-02 ENCOUNTER — Ambulatory Visit (INDEPENDENT_AMBULATORY_CARE_PROVIDER_SITE_OTHER): Payer: Medicare Other | Admitting: Family Medicine

## 2021-10-02 ENCOUNTER — Other Ambulatory Visit: Payer: Self-pay

## 2021-10-02 VITALS — BP 116/70 | HR 70 | Temp 97.7°F | Ht 61.5 in | Wt 108.0 lb

## 2021-10-02 DIAGNOSIS — I82402 Acute embolism and thrombosis of unspecified deep veins of left lower extremity: Secondary | ICD-10-CM | POA: Diagnosis not present

## 2021-10-02 DIAGNOSIS — G309 Alzheimer's disease, unspecified: Secondary | ICD-10-CM

## 2021-10-02 DIAGNOSIS — Z23 Encounter for immunization: Secondary | ICD-10-CM | POA: Diagnosis not present

## 2021-10-02 DIAGNOSIS — M810 Age-related osteoporosis without current pathological fracture: Secondary | ICD-10-CM | POA: Diagnosis not present

## 2021-10-02 DIAGNOSIS — F05 Delirium due to known physiological condition: Secondary | ICD-10-CM

## 2021-10-02 DIAGNOSIS — F02B18 Dementia in other diseases classified elsewhere, moderate, with other behavioral disturbance: Secondary | ICD-10-CM

## 2021-10-02 LAB — BASIC METABOLIC PANEL
BUN: 15 mg/dL (ref 6–23)
CO2: 35 mEq/L — ABNORMAL HIGH (ref 19–32)
Calcium: 9.9 mg/dL (ref 8.4–10.5)
Chloride: 103 mEq/L (ref 96–112)
Creatinine, Ser: 0.81 mg/dL (ref 0.40–1.20)
GFR: 69.81 mL/min (ref >60.00)
Glucose, Bld: 79 mg/dL (ref 70–99)
Potassium: 4.1 mEq/L (ref 3.5–5.1)
Sodium: 142 mEq/L (ref 135–145)

## 2021-10-02 MED ORDER — VITAMIN E 1000 UNITS PO CAPS: 1000.0000 [IU] | ORAL_CAPSULE | Freq: Two times a day (BID) | ORAL | Status: DC

## 2021-10-02 NOTE — Progress Notes (Signed)
Patient ID: Kimberly Davies, female    DOB: 08-Jan-1941, 80 y.o.   MRN: [id number]  This visit was conducted in person.  BP 116/70   Pulse 70   Temp 97.7 F (36.5 C) (Temporal)   Ht 5' 1.5" (1.562 m)   Wt 108 lb (49 kg)   SpO2 99%   BMI 20.08 kg/m    CC: 6 mo f/u visit  Subjective:   HPI: Kimberly Davies is a 80 y.o. female presenting on 10/02/2021 for Follow-up (Here for 6 mo f/u.  Pt accompanied by husband, Kimberly Davies- temp 97.5.)   Dementia presumed Alzheimer's - now followed by neurology Dr Melrose Nakayama. On namenda 10mg  BID as well as seroquel 1/2 pill at 4pm and 1/2-1 pill QHS PRN sundowning. Continue sertraline 50mg  daily for predominant anxiety. Did not tolerate aricept due to GI upset.   No falls recently.   Sees neurology and hematology every 6 months.   OP - last prolia injection 04/22/2021. We are working towards having upcoming prolia ready.      Relevant past medical, surgical, family and social history reviewed and updated as indicated. Interim medical history since our last visit reviewed. Allergies and medications reviewed and updated. Outpatient Medications Prior to Visit  Medication Sig Dispense Refill   apixaban (ELIQUIS) 2.5 MG TABS tablet Take 1 tablet (2.5 mg total) by mouth 2 (two) times daily. 60 tablet 12   Cholecalciferol (VITAMIN D) 50 MCG (2000 UT) CAPS Take 1 capsule (2,000 Units total) by mouth daily. 30 capsule    denosumab (PROLIA) 60 MG/ML SOSY injection Inject 60 mg into the skin every 6 (six) months.     memantine (NAMENDA) 10 MG tablet Take 1 tablet (10 mg total) by mouth 2 (two) times daily. 180 tablet 3   QUEtiapine (SEROQUEL) 25 MG tablet TAKE 1 TABLET BY MOUTH EVERYDAY AT BEDTIME 90 tablet 1   sertraline (ZOLOFT) 50 MG tablet Take 1 tablet (50 mg total) by mouth daily. 90 tablet 3   vitamin B-12 (CYANOCOBALAMIN) 1000 MCG tablet Take 1 tablet (1,000 mcg total) by mouth every Monday, Wednesday, and Friday.     alendronate (FOSAMAX) 70 MG tablet Take  1 tablet (70 mg total) by mouth every 7 (seven) days. Take with a full glass of water on an empty stomach. 4 tablet 3   diclofenac Sodium (VOLTAREN) 1 % GEL Apply 2 g topically 4 (four) times daily. 150 g 0   LORazepam (ATIVAN) 0.5 MG tablet Take 1 tablet (0.5 mg total) by mouth daily as needed for anxiety or sleep. (Patient not taking: Reported on 10/02/2021) 30 tablet 0   vitamin E 1000 UNIT capsule Take 1 capsule (1,000 Units total) by mouth in the morning and at bedtime. (Patient not taking: Reported on 04/01/2021)     No facility-administered medications prior to visit.     Per HPI unless specifically indicated in ROS section below Review of Systems  Objective:  BP 116/70   Pulse 70   Temp 97.7 F (36.5 C) (Temporal)   Ht 5' 1.5" (1.562 m)   Wt 108 lb (49 kg)   SpO2 99%   BMI 20.08 kg/m   Wt Readings from Last 3 Encounters:  10/02/21 108 lb (49 kg)  07/30/21 102 lb 9.6 oz (46.5 kg)  04/01/21 93 lb 6 oz (42.4 kg)      Physical Exam Vitals and nursing note reviewed.  Constitutional:      Appearance: Normal appearance. She is not ill-appearing.  Cardiovascular:     Rate and Rhythm: Normal rate and regular rhythm.     Pulses: Normal pulses.     Heart sounds: Normal heart sounds. No murmur heard. Pulmonary:     Effort: Pulmonary effort is normal. No respiratory distress.     Breath sounds: Normal breath sounds. No wheezing, rhonchi or rales.  Musculoskeletal:     Right lower leg: No edema.     Left lower leg: No edema.  Skin:    General: Skin is warm and dry.     Findings: No rash.  Neurological:     Mental Status: She is alert.  Psychiatric:        Mood and Affect: Mood normal.        Behavior: Behavior normal.      Results for orders placed or performed in visit on 07/30/21  Comprehensive metabolic panel  Result Value Ref Range   Sodium 139 135 - 145 mmol/L   Potassium 4.3 3.5 - 5.1 mmol/L   Chloride 101 98 - 111 mmol/L   CO2 31 22 - 32 mmol/L   Glucose, Bld  87 70 - 99 mg/dL   BUN 15 8 - 23 mg/dL   Creatinine, Ser 0.72 0.44 - 1.00 mg/dL   Calcium 9.5 8.9 - 10.3 mg/dL   Total Protein 8.0 6.5 - 8.1 g/dL   Albumin 4.2 3.5 - 5.0 g/dL   AST 28 15 - 41 U/L   ALT 23 0 - 44 U/L   Alkaline Phosphatase 67 38 - 126 U/L   Total Bilirubin 0.7 0.3 - 1.2 mg/dL   GFR, Estimated >60 >60 mL/min   Anion gap 7 5 - 15  CBC with Differential/Platelet  Result Value Ref Range   WBC 7.8 4.0 - 10.5 K/uL   RBC 4.22 3.87 - 5.11 MIL/uL   Hemoglobin 12.2 12.0 - 15.0 g/dL   HCT 39.0 36.0 - 46.0 %   MCV 92.4 80.0 - 100.0 fL   MCH 28.9 26.0 - 34.0 pg   MCHC 31.3 30.0 - 36.0 g/dL   RDW 13.1 11.5 - 15.5 %   Platelets 348 150 - 400 K/uL   nRBC 0.0 0.0 - 0.2 %   Neutrophils Relative % 49 %   Neutro Abs 3.9 1.7 - 7.7 K/uL   Lymphocytes Relative 35 %   Lymphs Abs 2.7 0.7 - 4.0 K/uL   Monocytes Relative 12 %   Monocytes Absolute 0.9 0.1 - 1.0 K/uL   Eosinophils Relative 3 %   Eosinophils Absolute 0.2 0.0 - 0.5 K/uL   Basophils Relative 1 %   Basophils Absolute 0.1 0.0 - 0.1 K/uL   Immature Granulocytes 0 %   Abs Immature Granulocytes 0.01 0.00 - 0.07 K/uL   Lab Results  Component Value Date   TSH 3.64 03/25/2021    Assessment & Plan:  This visit occurred during the SARS-CoV-2 public health emergency.  Safety protocols were in place, including screening questions prior to the visit, additional usage of staff PPE, and extensive cleaning of exam room while observing appropriate contact time as indicated for disinfecting solutions.   Problem List Items Addressed This Visit     Osteoporosis - Primary    Continue prolia. Update BMP today in preparation for injection next month.       Relevant Medications   denosumab (PROLIA) 60 MG/ML SOSY injection   vitamin E 1000 UNIT capsule   Other Relevant Orders   Basic metabolic panel   Alzheimer's dementia ([place])  Now followed by neurology Melrose Nakayama).  Continue namenda, seroquel for sundowning.  I asked him to check  if he's taking vitamin E.       Recurrent acute deep vein thrombosis (DVT) of left lower extremity (HCC)    Continues eliquis 2.5mg  BID through hematology      Sundowning    Continue seroquel 25mg  at 4pm and 25-50mg  QHS PRN.       Other Visit Diagnoses     Need for influenza vaccination       Relevant Orders   Flu Vaccine QUAD High Dose(Fluad) (Completed)        Meds ordered this encounter  Medications   vitamin E 1000 UNIT capsule    Sig: Take 1 capsule (1,000 Units total) by mouth in the morning and at bedtime.    Orders Placed This Encounter  Procedures   Flu Vaccine QUAD High Dose(Fluad)   Basic metabolic panel     Patient Instructions  Flu shot today  Labs today  Continue current medicines. Labs today in preparation for prolia injection which will be due on 10/24/2021.  Check on vitamin E dosing. You're also on vitamin D and B12.  Good to see you today.  Return as needed or in 6 months for physical. We may space out visits to once yearly at next appointment.   Follow up plan: Return in about 6 months (around 04/02/2022) for annual exam, prior fasting for blood work, medicare wellness visit.  Ria Bush, MD

## 2021-10-02 NOTE — Assessment & Plan Note (Signed)
Now followed by neurology Melrose Nakayama).  Continue namenda, seroquel for sundowning.  I asked him to check if he's taking vitamin E.

## 2021-10-02 NOTE — Assessment & Plan Note (Signed)
Continue prolia. Update BMP today in preparation for injection next month.

## 2021-10-02 NOTE — Patient Instructions (Addendum)
Flu shot today  Labs today  Continue current medicines. Labs today in preparation for prolia injection which will be due on 10/24/2021.  Check on vitamin E dosing. You're also on vitamin D and B12.  Good to see you today.  Return as needed or in 6 months for physical. We may space out visits to once yearly at next appointment.

## 2021-10-02 NOTE — Assessment & Plan Note (Signed)
Continue seroquel 25mg  at 4pm and 25-50mg  QHS PRN.

## 2021-10-02 NOTE — Assessment & Plan Note (Signed)
Continues eliquis 2.5mg  BID through hematology

## 2021-10-20 NOTE — Telephone Encounter (Signed)
Benefits received. OOP cost is $96.81. Spoke with patient's husband-Dewar. Nurse Visit scheduled for 11/05/21

## 2021-11-05 ENCOUNTER — Ambulatory Visit (INDEPENDENT_AMBULATORY_CARE_PROVIDER_SITE_OTHER): Payer: Medicare Other

## 2021-11-05 ENCOUNTER — Other Ambulatory Visit: Payer: Self-pay

## 2021-11-05 DIAGNOSIS — M810 Age-related osteoporosis without current pathological fracture: Secondary | ICD-10-CM

## 2021-11-05 MED ORDER — DENOSUMAB 60 MG/ML ~~LOC~~ SOSY
60.0000 mg | PREFILLED_SYRINGE | Freq: Once | SUBCUTANEOUS | Status: AC
  Administered 2021-11-05: 60 mg via SUBCUTANEOUS

## 2021-11-05 NOTE — Progress Notes (Signed)
Per orders of Dr. Lorelei Pont, in the absence of Dr. Danise Mina, an injection of Prolia given by Loreen Freud. Patient tolerated injection well.

## 2021-12-18 ENCOUNTER — Other Ambulatory Visit: Payer: Self-pay | Admitting: Family Medicine

## 2021-12-25 ENCOUNTER — Ambulatory Visit (INDEPENDENT_AMBULATORY_CARE_PROVIDER_SITE_OTHER): Payer: Medicare Other | Admitting: Family Medicine

## 2021-12-25 ENCOUNTER — Encounter: Payer: Self-pay | Admitting: Family Medicine

## 2021-12-25 ENCOUNTER — Other Ambulatory Visit: Payer: Self-pay

## 2021-12-25 VITALS — BP 118/66 | HR 57 | Temp 97.9°F | Ht 61.5 in | Wt 108.2 lb

## 2021-12-25 DIAGNOSIS — N3001 Acute cystitis with hematuria: Secondary | ICD-10-CM | POA: Diagnosis not present

## 2021-12-25 DIAGNOSIS — G309 Alzheimer's disease, unspecified: Secondary | ICD-10-CM

## 2021-12-25 DIAGNOSIS — R059 Cough, unspecified: Secondary | ICD-10-CM | POA: Insufficient documentation

## 2021-12-25 DIAGNOSIS — R051 Acute cough: Secondary | ICD-10-CM | POA: Diagnosis not present

## 2021-12-25 DIAGNOSIS — R350 Frequency of micturition: Secondary | ICD-10-CM | POA: Diagnosis not present

## 2021-12-25 DIAGNOSIS — F02B18 Dementia in other diseases classified elsewhere, moderate, with other behavioral disturbance: Secondary | ICD-10-CM

## 2021-12-25 LAB — POC URINALSYSI DIPSTICK (AUTOMATED)
Glucose, UA: NEGATIVE
Nitrite, UA: POSITIVE
Protein, UA: POSITIVE — AB
Spec Grav, UA: 1.02 (ref 1.010–1.025)
Urobilinogen, UA: 0.2 E.U./dL
pH, UA: 6 (ref 5.0–8.0)

## 2021-12-25 MED ORDER — NITROFURANTOIN MONOHYD MACRO 100 MG PO CAPS: 100.0000 mg | ORAL_CAPSULE | Freq: Two times a day (BID) | ORAL | 0 refills | Status: DC

## 2021-12-25 NOTE — Assessment & Plan Note (Signed)
Anticipate viral URI with cough.  Supportive measures reviewed.  Also recommend OTC cough suppressant like delsym or robitussin. Update if not improving as expected.

## 2021-12-25 NOTE — Assessment & Plan Note (Signed)
Story/exam suspicious for acute cystitis.  Rx macrobid x7d. UCx sent.  Push fluids, may use tylenol PRN discomfort.

## 2021-12-25 NOTE — Patient Instructions (Signed)
You have a UTI - take macrobid twice daily for 1 week.  Push water and rest.  May use tylenol for discomfort as needed.  For cough, may take over the counter delsym or dimetapp or robitussin as needed.  Let us know if not improving with this.   Urinary Tract Infection, Adult A urinary tract infection (UTI) is an infection of any part of the urinary tract. The urinary tract includes the kidneys, ureters, bladder, and urethra. These organs make, store, and get rid of urine in the body. An upper UTI affects the ureters and kidneys. A lower UTI affects the bladder and urethra. What are the causes? Most urinary tract infections are caused by bacteria in your genital area around your urethra, where urine leaves your body. These bacteria grow and cause inflammation of your urinary tract. What increases the risk? You are more likely to develop this condition if: You have a urinary catheter that stays in place. You are not able to control when you urinate or have a bowel movement (incontinence). You are female and you: Use a spermicide or diaphragm for birth control. Have low estrogen levels. Are pregnant. You have certain genes that increase your risk. You are sexually active. You take antibiotic medicines. You have a condition that causes your flow of urine to slow down, such as: An enlarged prostate, if you are female. Blockage in your urethra. A kidney stone. A nerve condition that affects your bladder control (neurogenic bladder). Not getting enough to drink, or not urinating often. You have certain medical conditions, such as: Diabetes. A weak disease-fighting system (immunesystem). Sickle cell disease. Gout. Spinal cord injury. What are the signs or symptoms? Symptoms of this condition include: Needing to urinate right away (urgency). Frequent urination. This may include small amounts of urine each time you urinate. Pain or burning with urination. Blood in the urine. Urine that  smells bad or unusual. Trouble urinating. Cloudy urine. Vaginal discharge, if you are female. Pain in the abdomen or the lower back. You may also have: Vomiting or a decreased appetite. Confusion. Irritability or tiredness. A fever or chills. Diarrhea. The first symptom in older adults may be confusion. In some cases, they may not have any symptoms until the infection has worsened. How is this diagnosed? This condition is diagnosed based on your medical history and a physical exam. You may also have other tests, including: Urine tests. Blood tests. Tests for STIs (sexually transmitted infections). If you have had more than one UTI, a cystoscopy or imaging studies may be done to determine the cause of the infections. How is this treated? Treatment for this condition includes: Antibiotic medicine. Over-the-counter medicines to treat discomfort. Drinking enough water to stay hydrated. If you have frequent infections or have other conditions such as a kidney stone, you may need to see a health care provider who specializes in the urinary tract (urologist). In rare cases, urinary tract infections can cause sepsis. Sepsis is a life-threatening condition that occurs when the body responds to an infection. Sepsis is treated in the hospital with IV antibiotics, fluids, and other medicines. Follow these instructions at home: Medicines Take over-the-counter and prescription medicines only as told by your health care provider. If you were prescribed an antibiotic medicine, take it as told by your health care provider. Do not stop using the antibiotic even if you start to feel better. General instructions Make sure you: Empty your bladder often and completely. Do not hold urine for long periods of time.  Empty your bladder after sex. Wipe from front to back after urinating or having a bowel movement if you are female. Use each tissue only one time when you wipe. Drink enough fluid to keep your  urine pale yellow. Keep all follow-up visits. This is important. Contact a health care provider if: Your symptoms do not get better after 1-2 days. Your symptoms go away and then return. Get help right away if: You have severe pain in your back or your lower abdomen. You have a fever or chills. You have nausea or vomiting. Summary A urinary tract infection (UTI) is an infection of any part of the urinary tract, which includes the kidneys, ureters, bladder, and urethra. Most urinary tract infections are caused by bacteria in your genital area. Treatment for this condition often includes antibiotic medicines. If you were prescribed an antibiotic medicine, take it as told by your health care provider. Do not stop using the antibiotic even if you start to feel better. Keep all follow-up visits. This is important. This information is not intended to replace advice given to you by your health care provider. Make sure you discuss any questions you have with your health care provider. Document Revised: 07/04/2020 Document Reviewed: 07/04/2020 Elsevier Patient Education  [place].

## 2021-12-25 NOTE — Progress Notes (Addendum)
Patient ID: Kimberly Davies, female    DOB: [birth year], 81 y.o.   MRN: [id number]  This visit was conducted in person.  BP 118/66    Pulse (!) 57    Temp 97.9 F (36.6 C) (Temporal)    Ht 5' 1.5" (1.562 m)    Wt 108 lb 3 oz (49.1 kg)    SpO2 100%    BMI 20.11 kg/m    CC: urinary symptoms Subjective:   HPI: Kimberly Davies is a 80 y.o. female presenting on 12/25/2021 for Urinary Frequency (C/o urinary frequency, low abd pain and foul smelling urine.  Sxs started about 2 wks ago.  Accompanied by husband, [name]. ) and Cough (C/o cough. )   1 wk h/o polyuria, suprapubic discomfort and strong urine smell. Few urinary accidents which is new. Felt lethargic all day yesterday.  Wet sounding cough developed 2 days ago. More runny nose, itchy watery eyes. Husband had flu a month ago.  No congestion. No dyspnea or wheezing.  No fevers/chills, dysuria, blood in urine.   Tried mucinex.   Husband assists with history due to dementia followed by neurology.      Relevant past medical, surgical, family and social history reviewed and updated as indicated. Interim medical history since our last visit reviewed. Allergies and medications reviewed and updated. Outpatient Medications Prior to Visit  Medication Sig Dispense Refill   apixaban (ELIQUIS) 2.5 MG TABS tablet Take 1 tablet (2.5 mg total) by mouth 2 (two) times daily. 60 tablet 12   Cholecalciferol (VITAMIN D) 50 MCG (2000 UT) CAPS Take 1 capsule (2,000 Units total) by mouth daily. 30 capsule    denosumab (PROLIA) 60 MG/ML SOSY injection Inject 60 mg into the skin every 6 (six) months.     memantine (NAMENDA) 10 MG tablet Take 1 tablet (10 mg total) by mouth 2 (two) times daily. 180 tablet 3   QUEtiapine (SEROQUEL) 25 MG tablet TAKE 1 TABLET BY MOUTH EVERYDAY AT BEDTIME 90 tablet 1   sertraline (ZOLOFT) 50 MG tablet Take 1 tablet (50 mg total) by mouth daily. 90 tablet 3   vitamin B-12 (CYANOCOBALAMIN) 1000 MCG tablet Take 1 tablet (1,000 mcg  total) by mouth every Monday, Wednesday, and Friday.     vitamin E 1000 UNIT capsule Take 1 capsule (1,000 Units total) by mouth in the morning and at bedtime.     No facility-administered medications prior to visit.     Per HPI unless specifically indicated in ROS section below Review of Systems  Objective:  BP 118/66    Pulse (!) 57    Temp 97.9 F (36.6 C) (Temporal)    Ht 5' 1.5" (1.562 m)    Wt 108 lb 3 oz (49.1 kg)    SpO2 100%    BMI 20.11 kg/m   Wt Readings from Last 3 Encounters:  12/25/21 108 lb 3 oz (49.1 kg)  10/02/21 108 lb (49 kg)  07/30/21 102 lb 9.6 oz (46.5 kg)      Physical Exam Vitals and nursing note reviewed.  Constitutional:      Appearance: Normal appearance. She is not ill-appearing.  HENT:     Head: Normocephalic and atraumatic.     Nose: Congestion present.  Eyes:     Extraocular Movements: Extraocular movements intact.     Pupils: Pupils are equal, round, and reactive to light.  Cardiovascular:     Rate and Rhythm: Normal rate and regular rhythm.     Pulses:  Normal pulses.     Heart sounds: Normal heart sounds. No murmur heard. Pulmonary:     Effort: Pulmonary effort is normal. No respiratory distress.     Breath sounds: Normal breath sounds. No wheezing, rhonchi or rales.     Comments: Mild cough present Abdominal:     General: Bowel sounds are normal. There is no distension.     Palpations: Abdomen is soft. There is no mass.     Tenderness: There is abdominal tenderness (mild) in the suprapubic area. There is no guarding or rebound. Negative signs include Murphy's sign.     Hernia: No hernia is present.  Skin:    General: Skin is warm and dry.  Neurological:     Mental Status: She is alert.  Psychiatric:        Mood and Affect: Mood normal.        Behavior: Behavior normal.      Results for orders placed or performed in visit on 12/25/21  POCT Urinalysis Dipstick (Automated)  Result Value Ref Range   Color, UA yellow    Clarity, UA  cloudy    Glucose, UA Negative Negative   Bilirubin, UA 1+    Ketones, UA +/-    Spec Grav, UA 1.020 1.010 - 1.025   Blood, UA 2+    pH, UA 6.0 5.0 - 8.0   Protein, UA Positive (A) Negative   Urobilinogen, UA 0.2 0.2 or 1.0 E.U./dL   Nitrite, UA positive    Leukocytes, UA Large (3+) (A) Negative   Lab Results  Component Value Date   CREATININE 0.81 10/02/2021   BUN 15 10/02/2021   NA 142 10/02/2021   K 4.1 10/02/2021   CL 103 10/02/2021   CO2 35 (H) 10/02/2021    Assessment & Plan:  This visit occurred during the SARS-CoV-2 public health emergency.  Safety protocols were in place, including screening questions prior to the visit, additional usage of staff PPE, and extensive cleaning of exam room while observing appropriate contact time as indicated for disinfecting solutions.   Problem List Items Addressed This Visit     Alzheimer's dementia ([place])   Acute cystitis with hematuria - Primary    Story/exam suspicious for acute cystitis.  Rx macrobid x7d. UCx sent.  Push fluids, may use tylenol PRN discomfort.       Relevant Orders   Urine Culture   Cough    Anticipate viral URI with cough.  Supportive measures reviewed.  Also recommend OTC cough suppressant like delsym or robitussin. Update if not improving as expected.       Other Visit Diagnoses     Urinary frequency       Relevant Orders   POCT Urinalysis Dipstick (Automated) (Completed)        Meds ordered this encounter  Medications   nitrofurantoin, macrocrystal-monohydrate, (MACROBID) 100 MG capsule    Sig: Take 1 capsule (100 mg total) by mouth 2 (two) times daily.    Dispense:  14 capsule    Refill:  0   Orders Placed This Encounter  Procedures   Urine Culture   POCT Urinalysis Dipstick (Automated)     Patient instructions: You have a UTI - take macrobid twice daily for 1 week.  Push water and rest.  May use tylenol for discomfort as needed.  For cough, may take over the counter delsym or  dimetapp or robitussin as needed.  Let us know if not improving with this.   Follow up plan:  No follow-ups on file.  Ria Bush, MD

## 2021-12-28 ENCOUNTER — Telehealth: Payer: Self-pay

## 2021-12-28 LAB — URINE CULTURE
MICRO NUMBER:: 12898577
SPECIMEN QUALITY:: ADEQUATE

## 2021-12-28 NOTE — Progress Notes (Signed)
See phone note

## 2021-12-28 NOTE — Telephone Encounter (Signed)
[name] - Client TELEPHONE ADVICE RECORD [facility] Patient Name: Kimberly Davies [facility] Gender: Female DOB: 12/07/1940 Age: 81 Y 2 D Return Phone Number: [phone] (Primary), [phone] (Secondary) Address: [place] City/ State/ Zip: [place] [address] Client [facility] [place] [facility] - Client Client [facility] [place] Provider Ria Bush - MD Contact Type Call Who Is Calling Patient / Member / Family / Caregiver Call Type Triage / Clinical Caller Name Lovene Maret Relationship To Patient Spouse Return Phone Number [phone] (Primary) Chief Complaint Fatigue (greater than THREE MONTHS old) Reason for Call Symptomatic / Request for [place] states his wife has been taken a medication and has been lethargic. Caller states she has been having difficulties in movement due to being tired. Caller states his wife vital signs are normal and was told it may ben dehydration. Translation No Nurse Assessment Nurse: D'Heur Lucia Gaskins, RN, Adrienne Date/Time (Eastern Time): 12/28/2021 11:26:21 AM Confirm and document reason for call. If symptomatic, describe symptoms. ---Caller states his wife has been taking a medication (Macrobid) since Friday and has been lethargic. She was already somewhat like this but it is worse. Caller states she has been having difficulties moving due to being tired. Caller states his wife's vital signs are normal and was told it may be dehydration. No fever. Does the patient have any new or worsening symptoms? ---Yes Will a triage be completed? ---Yes Related visit to physician within the last 2 weeks? ---Yes Does the PT have any chronic conditions? (i.e. diabetes, asthma, this includes High risk factors for pregnancy, etc.) ---Yes List chronic conditions. ---dementia, hx of DVT's Is this a behavioral health or substance abuse call?  ---No Guidelines Guideline Title Affirmed Question Affirmed Notes Nurse Date/Time (Eastern Time) Weakness (Generalized) and Fatigue Poor fluid intake probably caused the weakness Kimberly Davies 12/28/2021 11:32:09 AM PLEASE NOTE: All timestamps contained within this report are represented as [place] Standard Time. CONFIDENTIALTY NOTICE: This fax transmission is intended only for the addressee. It contains information that is legally privileged, confidential or otherwise protected from use or disclosure. If you are not the intended recipient, you are strictly prohibited from reviewing, disclosing, copying using or disseminating any of this information or taking any action in reliance on or regarding this information. If you have received this fax in error, please notify us immediately by telephone so that we can arrange for its return to [place]. Phone: [phone], Toll-Free: [phone], Fax: [phone] Page: 2 of 2 Call Id: [id number] Evangeline. Time [name] Time) Disposition Final User 12/28/2021 11:42:47 AM Home Care Yes [place], RN, Vincente Davies Caller Disagree/Comply Comply Caller Understands Yes PreDisposition Call Doctor Care Advice Given Per Guideline HOME CARE: * You should be able to treat this at home. REASSURANCE AND EDUCATION: FLUIDS: * Drink several glasses of fruit juice, other clear fluids or water. REST: * Lie down with feet elevated for 1 hour. * This will improve circulation and increase blood flow to the brain. CALL BACK IF: * Still feeling weak after 2 hours of rest and fluids * Passes out (faints) * You become worse CARE ADVICE given per Weakness and Fatigue (Adult) guideline. Comments User: Vincente Davies, D'Heur Lucia Gaskins, RN Date/Time [name] Time): 12/28/2021 11:30:05 AM BP is 105/65, pulse is 75. User: Vincente Davies, D'Heur Lucia Gaskins, RN Date/Time [name] Time): 12/28/2021 11:55:53 AM Spouse tells this nurse that patient is drinking very little; a cup of coffee  and a cup of  tea yesterday; she has had nothing to drink at all today. He states it is hard to get her to drink fluids

## 2021-12-28 NOTE — Telephone Encounter (Signed)
Is she urinating ok? Recommend try pushing sips of fluids at home for now.  Could try popsicles or foods high in water content such as melons, strawberries, oranges, celery.  Keep [place] updated with how she's doing.

## 2021-12-28 NOTE — Telephone Encounter (Signed)
I spoke with pts  husband (DPR signed) UTI  symptoms is better, the urine odor and darkness of urine has gone away. Pt is not having dry mouth,no abd pain,no dizziness. Pt has not been more confused. Pts husband said pt has not been drinking a lot but pt just drank tea at lunch and he thinks she is maybe doing a little better right now. Pt is awake and talking. No fever and pts husband is driving right now but earlier pts vital signs were OK. Pts husband said if pt needs to go to ED for IVs he will take pt but since pt just had visit with DR G wants note sent to Dr Darnell Level to see what his advice is. Sending note to Dr Valinda Hoar CMA and will teams [facility]. UC & ED precautions given and pt's husband voiced understanding.

## 2021-12-28 NOTE — Telephone Encounter (Signed)
Spoke with pt's husband, [name] (on dpr) asking if pt is urinating ok.  Says she is.  I relayed Dr. Synthia Innocent message and he verbalizes understanding.

## 2021-12-30 NOTE — Telephone Encounter (Signed)
This encounter was created in error - please disregard.

## 2022-01-03 ENCOUNTER — Emergency Department: Payer: Medicare Other

## 2022-01-03 ENCOUNTER — Encounter: Payer: Self-pay | Admitting: Emergency Medicine

## 2022-01-03 ENCOUNTER — Emergency Department
Admission: EM | Admit: 2022-01-03 | Discharge: 2022-01-03 | Disposition: A | Discharge status: Home / Self Care | Payer: Medicare Other | Attending: Emergency Medicine | Admitting: Emergency Medicine

## 2022-01-03 ENCOUNTER — Other Ambulatory Visit: Payer: Self-pay

## 2022-01-03 DIAGNOSIS — R531 Weakness: Secondary | ICD-10-CM | POA: Diagnosis not present

## 2022-01-03 DIAGNOSIS — M6281 Muscle weakness (generalized): Secondary | ICD-10-CM | POA: Insufficient documentation

## 2022-01-03 DIAGNOSIS — F0280 Dementia in other diseases classified elsewhere without behavioral disturbance: Secondary | ICD-10-CM | POA: Diagnosis not present

## 2022-01-03 DIAGNOSIS — R519 Headache, unspecified: Secondary | ICD-10-CM | POA: Diagnosis not present

## 2022-01-03 DIAGNOSIS — M79632 Pain in left forearm: Secondary | ICD-10-CM | POA: Diagnosis not present

## 2022-01-03 DIAGNOSIS — W19XXXA Unspecified fall, initial encounter: Secondary | ICD-10-CM | POA: Diagnosis not present

## 2022-01-03 DIAGNOSIS — G309 Alzheimer's disease, unspecified: Secondary | ICD-10-CM | POA: Diagnosis not present

## 2022-01-03 DIAGNOSIS — Y92000 Kitchen of unspecified non-institutional (private) residence as  the place of occurrence of the external cause: Secondary | ICD-10-CM | POA: Diagnosis not present

## 2022-01-03 LAB — BASIC METABOLIC PANEL
Anion gap: 8 (ref 5–15)
BUN: 12 mg/dL (ref 8–23)
CO2: 29 mmol/L (ref 22–32)
Calcium: 9.3 mg/dL (ref 8.9–10.3)
Chloride: 99 mmol/L (ref 98–111)
Creatinine, Ser: 0.71 mg/dL (ref 0.44–1.00)
GFR, Estimated: >60 mL/min (ref >60)
Glucose, Bld: 112 mg/dL — ABNORMAL HIGH (ref 70–99)
Potassium: 3.4 mmol/L — ABNORMAL LOW (ref 3.5–5.1)
Sodium: 136 mmol/L (ref 135–145)

## 2022-01-03 LAB — CBC
HCT: 35.8 % — ABNORMAL LOW (ref 36.0–46.0)
Hemoglobin: 10.9 g/dL — ABNORMAL LOW (ref 12.0–15.0)
MCH: 28.4 pg (ref 26.0–34.0)
MCHC: 30.4 g/dL (ref 30.0–36.0)
MCV: 93.2 fL (ref 80.0–100.0)
Platelets: 403 10*3/uL — ABNORMAL HIGH (ref 150–400)
RBC: 3.84 MIL/uL — ABNORMAL LOW (ref 3.87–5.11)
RDW: 13.1 % (ref 11.5–15.5)
WBC: 13.3 10*3/uL — ABNORMAL HIGH (ref 4.0–10.5)
nRBC: 0 % (ref 0.0–0.2)

## 2022-01-03 LAB — CBG MONITORING, ED: Glucose-Capillary: 94 mg/dL (ref 70–99)

## 2022-01-03 MED ORDER — LACTATED RINGERS IV BOLUS
1000.0000 mL | Freq: Once | INTRAVENOUS | Status: AC
  Administered 2022-01-03: 1000 mL via INTRAVENOUS

## 2022-01-03 NOTE — ED Triage Notes (Signed)
Pt to ED via [facility] from home. Pt Husband states that about 0430 this morning he heard pt fall in the kitchen. Pt husband reports that pt has had increased weakness since last week. Pt was being treated for UTI but has finished all of her medication. EMS reports that pt is on blood thinners. Pt did hit head and has a hematoma on the posterior head. Pt has hx/o dementia Pt is A & O x 1. Pt husband says this is baseline for her at times. Pt is c/o left arm pain, and husband states that pt was c/o tailbone pain.

## 2022-01-03 NOTE — ED Notes (Signed)
Pt not in room. Pt in CT.  

## 2022-01-03 NOTE — ED Notes (Signed)
Patient attempted to obtain urine specimen again with the assistance of her husband but patient had a bowel movement again with it. Patient's husband states that they just want to go home and he will try to get a specimen at home if needed. Patient is becoming more irritated. This nurse spoke to Dr. Joni Fears who agreed to discharge them.

## 2022-01-03 NOTE — ED Notes (Signed)
Sitting on commode attempting urine sample. Husband at side.

## 2022-01-03 NOTE — ED Notes (Signed)
Discharge instructions provided to patient and husband. INT removed with the catheter intact. No skin tears noted from tape. Follow-up also discussed.

## 2022-01-03 NOTE — ED Notes (Signed)
Report received from [place], [place]. Patient resting comfortably on stretcher in room. RR even and unlabored. Patient verbalizes no needs or complaints at this time. Patient is attempting to obtain urine specimen but her last few attempts, she had had a BM with it. Her husband is at bedside to attempt to help.

## 2022-01-03 NOTE — ED Notes (Signed)
Pt voided and missed the urinary collection hat. Given PO water, will attempt some more urine.

## 2022-01-03 NOTE — ED Notes (Signed)
Alert, NAD, calm, interactive, denies pain. Husband at [facility].

## 2022-01-03 NOTE — ED Notes (Signed)
Urine contaminated with stool, attempting 3rd sample

## 2022-01-03 NOTE — ED Provider Notes (Signed)
[facility] Provider Note    Event Date/Time   First MD Initiated Contact with Patient 01/03/22 1535     (approximate)   History   Fall and Weakness   HPI  Kimberly Davies is a 81 y.o. female with a history of Alzheimer's dementia, osteoporosis, and recently treated for a UTI by her primary care doctor 2 weeks ago is brought to the ED today due to generalized weakness over the past week and a fall this morning at 4:30 AM.  History is obtained from the husband due to patient's dementia.  He was in bed sleeping when she fell, and he heard the impact with the floor and woke up.    She has been eating okay and drinking fluids sparingly but at baseline.  He wonders if she is dehydrated, and reports that he called her PCP and they also suggested she was likely to be dehydrated.  Patient denies any chest pain or shortness of breath.     Physical Exam   Triage Vital Signs: ED Triage Vitals  Enc Vitals Group     BP 01/03/22 1514 (!) 114/58     Pulse Rate 01/03/22 1514 84     Resp 01/03/22 1514 16     Temp 01/03/22 1514 99.3 F (37.4 C)     Temp Source 01/03/22 1514 Oral     SpO2 01/03/22 1514 96 %     Weight 01/03/22 1510 108 lb (49 kg)     Height 01/03/22 1510 5\' 2"  (1.575 m)     Head Circumference --      Peak Flow --      Pain Score --      Pain Loc --      Pain Edu? --      Excl. in [place]? --     Most recent vital signs: Vitals:   01/03/22 1800 01/03/22 1815  BP: 123/67 124/78  Pulse: 92 84  Resp: 19 19  Temp:    SpO2: 95% 100%     General: Awake, no distress.  CV:  Good peripheral perfusion.  Regular rate and rhythm Resp:  Normal effort.  No tachypnea.  Clear to auscultation bilaterally. Abd:  No distention.  Soft, mild suprapubic tenderness. Other:  Moist oral mucosa.  Occipital scalp hematoma, no laceration, no bleeding.  There is some apprehension with manipulation and palpation of the left forearm without bony point tenderness or  deformity.   ED Results / Procedures / Treatments   Labs (all labs ordered are listed, but only abnormal results are displayed) Labs Reviewed  BASIC METABOLIC PANEL - Abnormal; Notable for the following components:      Result Value   Potassium 3.4 (*)    Glucose, Bld 112 (*)    All other components within normal limits  CBC - Abnormal; Notable for the following components:   WBC 13.3 (*)    RBC 3.84 (*)    Hemoglobin 10.9 (*)    HCT 35.8 (*)    Platelets 403 (*)    All other components within normal limits  URINALYSIS, ROUTINE W REFLEX MICROSCOPIC  CBG MONITORING, ED     EKG  Interpreted by me Normal sinus rhythm rate of 84.  Normal axis and intervals.  Normal QRS ST segments and T waves.  No ischemic changes.   RADIOLOGY CT head viewed and interpreted by me, no intracranial hemorrhage.  Radiology report reviewed.  CT cervical spine negative for fracture.  X-ray left  forearm negative for fracture or other acute issues.  PROCEDURES:  Critical Care performed: No  Procedures   MEDICATIONS ORDERED IN ED: Medications  lactated ringers bolus 1,000 mL (0 mLs Intravenous Stopped 01/03/22 1826)     IMPRESSION / MDM / ASSESSMENT AND PLAN / ED COURSE  I reviewed the triage vital signs and the nursing notes.                              Differential diagnosis includes, but is not limited to, intracranial hemorrhage, C-spine fracture, dehydration, electrolyte abnormality, renal insufficiency, persistent UTI, anemia     Patient presents after fall at home.  She has had some generalized weakness and this is suspected to be mechanical.  No acute complaints currently except for some posterior scalp pain and apparent left forearm pain.  CT scans of the head and neck are unremarkable.  Metabolic panel and CBC are unremarkable.  Vital signs are normal.  She is nontoxic.  Will give IV fluids for hydration.  We will recheck urinalysis to ensure that UTI has cleared after  taking Macrobid.  She does not require admission due to reassuring blood work and lack of serious traumatic findings on imaging.  ----------------------------------------- 7:49 PM on 01/03/2022 ----------------------------------------- Patient has been ambulatory in the room.  She has voided multiple times, but each time has contaminated the sample with a bowel movement.  Husband does not want to continue trying to provide a sample, refuses in and out cath.  Wishes to be discharged.  She is stable and can follow-up with primary care.      FINAL CLINICAL IMPRESSION(S) / ED DIAGNOSES   Final diagnoses:  Generalized weakness  Fall, initial encounter     Rx / DC Orders   ED Discharge Orders     None        Note:  This document was prepared using Dragon voice recognition software and may include unintentional dictation errors.   Carrie Mew, MD 01/03/22 1950

## 2022-01-05 ENCOUNTER — Encounter: Payer: Self-pay | Admitting: Family Medicine

## 2022-01-05 ENCOUNTER — Other Ambulatory Visit: Payer: Self-pay

## 2022-01-05 ENCOUNTER — Ambulatory Visit (INDEPENDENT_AMBULATORY_CARE_PROVIDER_SITE_OTHER): Payer: Medicare Other | Admitting: Family Medicine

## 2022-01-05 VITALS — BP 120/72 | HR 96 | Temp 97.7°F | Ht 62.0 in | Wt 111.5 lb

## 2022-01-05 DIAGNOSIS — W19XXXA Unspecified fall, initial encounter: Secondary | ICD-10-CM

## 2022-01-05 DIAGNOSIS — N3001 Acute cystitis with hematuria: Secondary | ICD-10-CM

## 2022-01-05 DIAGNOSIS — G309 Alzheimer's disease, unspecified: Secondary | ICD-10-CM

## 2022-01-05 DIAGNOSIS — I82402 Acute embolism and thrombosis of unspecified deep veins of left lower extremity: Secondary | ICD-10-CM | POA: Diagnosis not present

## 2022-01-05 DIAGNOSIS — Y92009 Unspecified place in unspecified non-institutional (private) residence as the place of occurrence of the external cause: Secondary | ICD-10-CM

## 2022-01-05 DIAGNOSIS — F02B18 Dementia in other diseases classified elsewhere, moderate, with other behavioral disturbance: Secondary | ICD-10-CM

## 2022-01-05 DIAGNOSIS — N390 Urinary tract infection, site not specified: Secondary | ICD-10-CM

## 2022-01-05 LAB — POC URINALSYSI DIPSTICK (AUTOMATED)
Bilirubin, UA: NEGATIVE
Glucose, UA: NEGATIVE
Ketones, UA: NEGATIVE
Nitrite, UA: NEGATIVE
Protein, UA: POSITIVE — AB
Spec Grav, UA: 1.02 (ref 1.010–1.025)
Urobilinogen, UA: 0.2 E.U./dL
pH, UA: 6 (ref 5.0–8.0)

## 2022-01-05 NOTE — Progress Notes (Signed)
Patient ID: Kimberly Davies, female    DOB: 1941-10-02, 81 y.o.   MRN: [id number]  This visit was conducted in person.  BP 120/72    Pulse 96    Temp 97.7 F (36.5 C) (Temporal)    Ht 5\' 2"  (1.575 m)    Wt 111 lb 8 oz (50.6 kg)    SpO2 98%    BMI 20.39 kg/m    CC: ER f/u visit  Subjective:   HPI: Kimberly Davies is a 81 y.o. female presenting on 01/05/2022 for Hospitalization Follow-up (Seen on 01/03/22 at [facility] ED, dx generalized weakness; fall.  Pt accompanied by husband, [name]. )   Recent UTI treated 12/25/2021 with 7d nitrofurantoin course. UCx grew >100k E coli resistant to cipro, levaquin, bactrim. She had decreased PO intake despite encouraging small sips throughout the day and water filled food like melons and strawberries. Presented to ER 01/03/2022 with generalized weakness resulting in fall at home. ER records reviewed. Workup included labs and normal head CT, cervical spine CT and L forearm xrays without fracture. Treated with IVF 1065mL lactated ringer. Was unable to provide clean sample for urinalysis.   Husband notes increased leg weakness over the past few weeks.   Since home, no fevers/chills, nausea/vomiting, urine smell has returned to normal. Some polyuria during the day but no nocturia. No abdominal pain.      Relevant past medical, surgical, family and social history reviewed and updated as indicated. Interim medical history since our last visit reviewed. Allergies and medications reviewed and updated. Outpatient Medications Prior to Visit  Medication Sig Dispense Refill   apixaban (ELIQUIS) 2.5 MG TABS tablet Take 1 tablet (2.5 mg total) by mouth 2 (two) times daily. 60 tablet 12   Cholecalciferol (VITAMIN D) 50 MCG (2000 UT) CAPS Take 1 capsule (2,000 Units total) by mouth daily. 30 capsule    denosumab (PROLIA) 60 MG/ML SOSY injection Inject 60 mg into the skin every 6 (six) months.     memantine (NAMENDA) 10 MG tablet Take 1 tablet (10 mg total) by mouth 2 (two)  times daily. 180 tablet 3   QUEtiapine (SEROQUEL) 25 MG tablet TAKE 1 TABLET BY MOUTH EVERYDAY AT BEDTIME 90 tablet 1   sertraline (ZOLOFT) 50 MG tablet Take 1 tablet (50 mg total) by mouth daily. 90 tablet 3   vitamin B-12 (CYANOCOBALAMIN) 1000 MCG tablet Take 1 tablet (1,000 mcg total) by mouth every Monday, Wednesday, and Friday.     vitamin E 1000 UNIT capsule Take 1 capsule (1,000 Units total) by mouth in the morning and at bedtime.     nitrofurantoin, macrocrystal-monohydrate, (MACROBID) 100 MG capsule Take 1 capsule (100 mg total) by mouth 2 (two) times daily. 14 capsule 0   No facility-administered medications prior to visit.     Per HPI unless specifically indicated in ROS section below Review of Systems  Objective:  BP 120/72    Pulse 96    Temp 97.7 F (36.5 C) (Temporal)    Ht 5\' 2"  (1.575 m)    Wt 111 lb 8 oz (50.6 kg)    SpO2 98%    BMI 20.39 kg/m   Wt Readings from Last 3 Encounters:  01/05/22 111 lb 8 oz (50.6 kg)  01/03/22 108 lb (49 kg)  12/25/21 108 lb 3 oz (49.1 kg)      Physical Exam Vitals and nursing note reviewed.  Constitutional:      Appearance: Normal appearance. She is not ill-appearing.  Cardiovascular:     Rate and Rhythm: Normal rate and regular rhythm.     Pulses: Normal pulses.     Heart sounds: Normal heart sounds. No murmur heard. Pulmonary:     Effort: Pulmonary effort is normal. No respiratory distress.     Breath sounds: Normal breath sounds. No wheezing, rhonchi or rales.  Abdominal:     General: Bowel sounds are normal. There is no distension.     Palpations: Abdomen is soft. There is no mass.     Tenderness: There is no abdominal tenderness. There is no right CVA tenderness, left CVA tenderness, guarding or rebound.     Hernia: No hernia is present.  Musculoskeletal:     Right lower leg: No edema.     Left lower leg: No edema.  Skin:    General: Skin is warm and dry.     Findings: No rash.  Neurological:     Mental Status: She is  alert.     Comments:  Unsteady on feet with slowed/unsure transition from sitting to standing No significant tremor Grip strength intact bilaterally 5/5 strength BUE and BLE, with some difficulty following directions for strength testing  Psychiatric:        Mood and Affect: Mood normal.        Behavior: Behavior normal.      Results for orders placed or performed in visit on 01/05/22  POCT Urinalysis Dipstick (Automated)  Result Value Ref Range   Color, UA yellow    Clarity, UA clear    Glucose, UA Negative Negative   Bilirubin, UA negative    Ketones, UA negative    Spec Grav, UA 1.020 1.010 - 1.025   Blood, UA 2+    pH, UA 6.0 5.0 - 8.0   Protein, UA Positive (A) Negative   Urobilinogen, UA 0.2 0.2 or 1.0 E.U./dL   Nitrite, UA negative    Leukocytes, UA Trace (A) Negative  Micro: WBC rare  RBC 0-2  Bact tr  Casts none  Epi rare  UCx sent  Lab Results  Component Value Date   CREATININE 0.71 01/03/2022   BUN 12 01/03/2022   NA 136 01/03/2022   K 3.4 (L) 01/03/2022   CL 99 01/03/2022   CO2 29 01/03/2022    Lab Results  Component Value Date   WBC 13.3 (H) 01/03/2022   HGB 10.9 (L) 01/03/2022   HCT 35.8 (L) 01/03/2022   MCV 93.2 01/03/2022   PLT 403 (H) 01/03/2022   Assessment & Plan:  This visit occurred during the SARS-CoV-2 public health emergency.  Safety protocols were in place, including screening questions prior to the visit, additional usage of staff PPE, and extensive cleaning of exam room while observing appropriate contact time as indicated for disinfecting solutions.   Problem List Items Addressed This Visit     Alzheimer's dementia [facility])    Ongoing struggle with significant caregiver load in husband.  Continues seeing neurology (Dr Melrose Nakayama), next visit scheduled 02/2022. Continues namenda 10mg  bid, sertraline 50mg  daily and seroquel 25mg  nightly.  Info provided for senior resources of [facility] as husband may be interested in hiring personal  care service for assistance at home. Discussed HH option.       Recurrent acute deep vein thrombosis (DVT) of left lower extremity (HCC)    Continues low dose eliquis 2.5mg  bid.  Will need to weigh benefits/risks of ongoing anticoagulation if progressive debility/increased fall risk noted.       Acute  cystitis with hematuria    Previous UCx grew >100k E coli. UTI symptoms largely resolved after macrobid 7d course. Update UA today - with 2+ blood and trace LE however microscopy not very impressive - anticipate more chronic colonization vs ongoing infection. Will regardless send UCx for further evaluation.        Relevant Orders   POCT Urinalysis Dipstick (Automated) (Completed)   Urine Culture   Fall at home - Primary    Recent ER visit for generalized weakness and falls. Fortunately no fracture/significant injury noted.  Nitrofurantoin antibiotic may have contributed to weakness/falls - would avoid in the future.  Husband notes increased patient unsteadiness at home - overall reassuring exam today - ?fear of falling contributing. Discussed option of HH PT evaluation - they will consider this. Husband will also look into further assistance through personal care service.         No orders of the defined types were placed in this encounter.  Orders Placed This Encounter  Procedures   Urine Culture   POCT Urinalysis Dipstick (Automated)     Patient Instructions  Urinalysis today  Potassium was borderline low - work on potassium rich diet (fruits, vegetables, milk, potatoes).  You were mildly anemic - we will recheck this at your physical.  Let us know if weakness not improving over time.   May look into senior resources center of [facility] for recommendations on personal care services for home. Give them a call.   [email or web address] Phone [phone]  Email [email or web address]   Follow up plan: Return if symptoms worsen or fail to  improve.  Ria Bush, MD

## 2022-01-05 NOTE — Patient Instructions (Addendum)
Urinalysis today  Potassium was borderline low - work on potassium rich diet (fruits, vegetables, milk, potatoes).  You were mildly anemic - we will recheck this at your physical.  Let us know if weakness not improving over time.   May look into senior resources center of [facility] for recommendations on personal care services for home. Give them a call.   [email or web address] Phone [phone]  Email [email or web address]

## 2022-01-06 ENCOUNTER — Encounter: Payer: Self-pay | Admitting: Family Medicine

## 2022-01-06 DIAGNOSIS — Y92009 Unspecified place in unspecified non-institutional (private) residence as the place of occurrence of the external cause: Secondary | ICD-10-CM | POA: Insufficient documentation

## 2022-01-06 DIAGNOSIS — W19XXXA Unspecified fall, initial encounter: Secondary | ICD-10-CM | POA: Insufficient documentation

## 2022-01-06 LAB — URINE CULTURE
MICRO NUMBER:: 12943432
SPECIMEN QUALITY:: ADEQUATE

## 2022-01-06 NOTE — Assessment & Plan Note (Signed)
Continues low dose eliquis 2.5mg  bid.  Will need to weigh benefits/risks of ongoing anticoagulation if progressive debility/increased fall risk noted.

## 2022-01-06 NOTE — Assessment & Plan Note (Signed)
Recent ER visit for generalized weakness and falls. Fortunately no fracture/significant injury noted.  Nitrofurantoin antibiotic may have contributed to weakness/falls - would avoid in the future.  Husband notes increased patient unsteadiness at home - overall reassuring exam today - ?fear of falling contributing. Discussed option of HH PT evaluation - they will consider this. Husband will also look into further assistance through personal care service.

## 2022-01-06 NOTE — Assessment & Plan Note (Signed)
Ongoing struggle with significant caregiver load in husband.  Continues seeing neurology (Dr Melrose Nakayama), next visit scheduled 02/2022. Continues namenda 10mg  bid, sertraline 50mg  daily and seroquel 25mg  nightly.  Info provided for senior resources of [facility] as husband may be interested in hiring personal care service for assistance at home. Discussed HH option.

## 2022-01-06 NOTE — Assessment & Plan Note (Signed)
Previous UCx grew >100k E coli. UTI symptoms largely resolved after macrobid 7d course. Update UA today - with 2+ blood and trace LE however microscopy not very impressive - anticipate more chronic colonization vs ongoing infection. Will regardless send UCx for further evaluation.

## 2022-01-09 ENCOUNTER — Other Ambulatory Visit: Payer: Self-pay | Admitting: Family Medicine

## 2022-01-26 ENCOUNTER — Other Ambulatory Visit: Payer: Self-pay | Admitting: *Deleted

## 2022-01-26 DIAGNOSIS — I82402 Acute embolism and thrombosis of unspecified deep veins of left lower extremity: Secondary | ICD-10-CM

## 2022-02-01 ENCOUNTER — Encounter: Payer: Self-pay | Admitting: Oncology

## 2022-02-01 ENCOUNTER — Inpatient Hospital Stay: Payer: Medicare Other | Admitting: Oncology

## 2022-02-01 ENCOUNTER — Inpatient Hospital Stay: Payer: Medicare Other | Attending: Oncology

## 2022-02-01 ENCOUNTER — Other Ambulatory Visit: Payer: Self-pay

## 2022-02-01 VITALS — BP 126/66 | HR 74 | Temp 98.4°F | Resp 18 | Wt 109.4 lb

## 2022-02-01 DIAGNOSIS — Z9049 Acquired absence of other specified parts of digestive tract: Secondary | ICD-10-CM | POA: Diagnosis not present

## 2022-02-01 DIAGNOSIS — Z8052 Family history of malignant neoplasm of bladder: Secondary | ICD-10-CM | POA: Insufficient documentation

## 2022-02-01 DIAGNOSIS — Z801 Family history of malignant neoplasm of trachea, bronchus and lung: Secondary | ICD-10-CM | POA: Diagnosis not present

## 2022-02-01 DIAGNOSIS — Z811 Family history of alcohol abuse and dependence: Secondary | ICD-10-CM | POA: Diagnosis not present

## 2022-02-01 DIAGNOSIS — Z823 Family history of stroke: Secondary | ICD-10-CM | POA: Insufficient documentation

## 2022-02-01 DIAGNOSIS — E785 Hyperlipidemia, unspecified: Secondary | ICD-10-CM | POA: Insufficient documentation

## 2022-02-01 DIAGNOSIS — Z833 Family history of diabetes mellitus: Secondary | ICD-10-CM | POA: Insufficient documentation

## 2022-02-01 DIAGNOSIS — Z86718 Personal history of other venous thrombosis and embolism: Secondary | ICD-10-CM | POA: Diagnosis not present

## 2022-02-01 DIAGNOSIS — I82402 Acute embolism and thrombosis of unspecified deep veins of left lower extremity: Secondary | ICD-10-CM

## 2022-02-01 DIAGNOSIS — Z79899 Other long term (current) drug therapy: Secondary | ICD-10-CM | POA: Diagnosis not present

## 2022-02-01 DIAGNOSIS — Z7901 Long term (current) use of anticoagulants: Secondary | ICD-10-CM | POA: Insufficient documentation

## 2022-02-01 DIAGNOSIS — Z806 Family history of leukemia: Secondary | ICD-10-CM | POA: Insufficient documentation

## 2022-02-01 DIAGNOSIS — F0390 Unspecified dementia without behavioral disturbance: Secondary | ICD-10-CM | POA: Insufficient documentation

## 2022-02-01 DIAGNOSIS — R296 Repeated falls: Secondary | ICD-10-CM

## 2022-02-01 DIAGNOSIS — Z8249 Family history of ischemic heart disease and other diseases of the circulatory system: Secondary | ICD-10-CM | POA: Insufficient documentation

## 2022-02-01 DIAGNOSIS — Z880 Allergy status to penicillin: Secondary | ICD-10-CM | POA: Insufficient documentation

## 2022-02-01 DIAGNOSIS — Z818 Family history of other mental and behavioral disorders: Secondary | ICD-10-CM | POA: Insufficient documentation

## 2022-02-01 DIAGNOSIS — Z885 Allergy status to narcotic agent status: Secondary | ICD-10-CM | POA: Insufficient documentation

## 2022-02-01 DIAGNOSIS — M79662 Pain in left lower leg: Secondary | ICD-10-CM | POA: Insufficient documentation

## 2022-02-01 LAB — COMPREHENSIVE METABOLIC PANEL
ALT: 16 U/L (ref 0–44)
AST: 24 U/L (ref 15–41)
Albumin: 3.8 g/dL (ref 3.5–5.0)
Alkaline Phosphatase: 104 U/L (ref 38–126)
Anion gap: 6 (ref 5–15)
BUN: 13 mg/dL (ref 8–23)
CO2: 32 mmol/L (ref 22–32)
Calcium: 9.4 mg/dL (ref 8.9–10.3)
Chloride: 101 mmol/L (ref 98–111)
Creatinine, Ser: 0.76 mg/dL (ref 0.44–1.00)
GFR, Estimated: >60 mL/min (ref >60)
Glucose, Bld: 87 mg/dL (ref 70–99)
Potassium: 4.1 mmol/L (ref 3.5–5.1)
Sodium: 139 mmol/L (ref 135–145)
Total Bilirubin: 0.2 mg/dL — ABNORMAL LOW (ref 0.3–1.2)
Total Protein: 7.8 g/dL (ref 6.5–8.1)

## 2022-02-01 LAB — CBC WITH DIFFERENTIAL/PLATELET
Abs Immature Granulocytes: 0.02 10*3/uL (ref 0.00–0.07)
Basophils Absolute: 0.1 10*3/uL (ref 0.0–0.1)
Basophils Relative: 1 %
Eosinophils Absolute: 0.4 10*3/uL (ref 0.0–0.5)
Eosinophils Relative: 5 %
HCT: 38.7 % (ref 36.0–46.0)
Hemoglobin: 11.7 g/dL — ABNORMAL LOW (ref 12.0–15.0)
Immature Granulocytes: 0 %
Lymphocytes Relative: 38 %
Lymphs Abs: 2.8 10*3/uL (ref 0.7–4.0)
MCH: 27.9 pg (ref 26.0–34.0)
MCHC: 30.2 g/dL (ref 30.0–36.0)
MCV: 92.4 fL (ref 80.0–100.0)
Monocytes Absolute: 0.8 10*3/uL (ref 0.1–1.0)
Monocytes Relative: 11 %
Neutro Abs: 3.3 10*3/uL (ref 1.7–7.7)
Neutrophils Relative %: 45 %
Platelets: 347 10*3/uL (ref 150–400)
RBC: 4.19 MIL/uL (ref 3.87–5.11)
RDW: 13.6 % (ref 11.5–15.5)
WBC: 7.3 10*3/uL (ref 4.0–10.5)
nRBC: 0 % (ref 0.0–0.2)

## 2022-02-01 MED ORDER — APIXABAN 2.5 MG PO TABS: 2.5000 mg | ORAL_TABLET | Freq: Two times a day (BID) | ORAL | 6 refills | Status: DC

## 2022-02-01 NOTE — Progress Notes (Signed)
Takes eliquis daily as ordered. No pain or swelling in left legs. She does have some bilateral leg aching at times.
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
## 2022-03-25 ENCOUNTER — Other Ambulatory Visit: Payer: Self-pay | Admitting: Family Medicine

## 2022-03-25 DIAGNOSIS — D649 Anemia, unspecified: Secondary | ICD-10-CM

## 2022-03-25 DIAGNOSIS — R7989 Other specified abnormal findings of blood chemistry: Secondary | ICD-10-CM

## 2022-03-25 DIAGNOSIS — E559 Vitamin D deficiency, unspecified: Secondary | ICD-10-CM

## 2022-03-25 DIAGNOSIS — E538 Deficiency of other specified B group vitamins: Secondary | ICD-10-CM

## 2022-03-25 DIAGNOSIS — E782 Mixed hyperlipidemia: Secondary | ICD-10-CM

## 2022-03-26 ENCOUNTER — Other Ambulatory Visit (INDEPENDENT_AMBULATORY_CARE_PROVIDER_SITE_OTHER): Payer: Medicare Other

## 2022-03-26 DIAGNOSIS — E559 Vitamin D deficiency, unspecified: Secondary | ICD-10-CM | POA: Diagnosis not present

## 2022-03-26 DIAGNOSIS — R7989 Other specified abnormal findings of blood chemistry: Secondary | ICD-10-CM

## 2022-03-26 DIAGNOSIS — E538 Deficiency of other specified B group vitamins: Secondary | ICD-10-CM

## 2022-03-26 DIAGNOSIS — D649 Anemia, unspecified: Secondary | ICD-10-CM | POA: Diagnosis not present

## 2022-03-26 DIAGNOSIS — E782 Mixed hyperlipidemia: Secondary | ICD-10-CM | POA: Diagnosis not present

## 2022-03-26 LAB — COMPREHENSIVE METABOLIC PANEL
ALT: 11 U/L (ref 0–35)
AST: 18 U/L (ref 0–37)
Albumin: 4.2 g/dL (ref 3.5–5.2)
Alkaline Phosphatase: 74 U/L (ref 39–117)
BUN: 21 mg/dL (ref 6–23)
CO2: 32 mEq/L (ref 19–32)
Calcium: 9.6 mg/dL (ref 8.4–10.5)
Chloride: 104 mEq/L (ref 96–112)
Creatinine, Ser: 0.85 mg/dL (ref 0.40–1.20)
GFR: 65.67 mL/min (ref >60.00)
Glucose, Bld: 72 mg/dL (ref 70–99)
Potassium: 4.1 mEq/L (ref 3.5–5.1)
Sodium: 143 mEq/L (ref 135–145)
Total Bilirubin: 0.4 mg/dL (ref 0.2–1.2)
Total Protein: 7.3 g/dL (ref 6.0–8.3)

## 2022-03-26 LAB — CBC WITH DIFFERENTIAL/PLATELET
Basophils Absolute: 0.1 10*3/uL (ref 0.0–0.1)
Basophils Relative: 0.9 % (ref 0.0–3.0)
Eosinophils Absolute: 0.2 10*3/uL (ref 0.0–0.7)
Eosinophils Relative: 2 % (ref 0.0–5.0)
HCT: 36.7 % (ref 36.0–46.0)
Hemoglobin: 11.9 g/dL — ABNORMAL LOW (ref 12.0–15.0)
Lymphocytes Relative: 33.6 % (ref 12.0–46.0)
Lymphs Abs: 2.7 10*3/uL (ref 0.7–4.0)
MCHC: 32.3 g/dL (ref 30.0–36.0)
MCV: 87 fl (ref 78.0–100.0)
Monocytes Absolute: 0.9 10*3/uL (ref 0.1–1.0)
Monocytes Relative: 11.8 % (ref 3.0–12.0)
Neutro Abs: 4.1 10*3/uL (ref 1.4–7.7)
Neutrophils Relative %: 51.7 % (ref 43.0–77.0)
Platelets: 344 10*3/uL (ref 150.0–400.0)
RBC: 4.21 Mil/uL (ref 3.87–5.11)
RDW: 14.5 % (ref 11.5–15.5)
WBC: 7.9 10*3/uL (ref 4.0–10.5)

## 2022-03-26 LAB — VITAMIN B12: Vitamin B-12: 435 pg/mL (ref 211–911)

## 2022-03-26 LAB — LIPID PANEL
Cholesterol: 225 mg/dL — ABNORMAL HIGH (ref 0–200)
HDL: 60.8 mg/dL (ref >39.00)
LDL Cholesterol: 140 mg/dL — ABNORMAL HIGH (ref 0–99)
NonHDL: 163.88
Total CHOL/HDL Ratio: 4
Triglycerides: 119 mg/dL (ref 0.0–149.0)
VLDL: 23.8 mg/dL (ref 0.0–40.0)

## 2022-03-26 LAB — TSH: TSH: 3.9 u[IU]/mL (ref 0.35–5.50)

## 2022-03-26 LAB — T4, FREE: Free T4: 0.7 ng/dL (ref 0.60–1.60)

## 2022-03-26 LAB — VITAMIN D 25 HYDROXY (VIT D DEFICIENCY, FRACTURES): VITD: 20.63 ng/mL — ABNORMAL LOW (ref 30.00–100.00)

## 2022-04-02 ENCOUNTER — Encounter: Payer: Medicare Other | Admitting: Family Medicine

## 2022-04-03 ENCOUNTER — Other Ambulatory Visit: Payer: Self-pay | Admitting: Family Medicine

## 2022-04-08 ENCOUNTER — Telehealth: Payer: Self-pay | Admitting: Family Medicine

## 2022-04-08 NOTE — Telephone Encounter (Signed)
Tried calling patient to schedule Medicare Annual Wellness Visit (AWV) either virtually or phone ? ?No answer  ? ? ?Last AWV4/22/22 ?; please schedule at anytime with health coach ? ? ?

## 2022-04-10 ENCOUNTER — Other Ambulatory Visit: Payer: Self-pay | Admitting: Family Medicine

## 2022-05-14 ENCOUNTER — Telehealth: Payer: Self-pay

## 2022-05-14 DIAGNOSIS — M810 Age-related osteoporosis without current pathological fracture: Secondary | ICD-10-CM

## 2022-05-14 DIAGNOSIS — E559 Vitamin D deficiency, unspecified: Secondary | ICD-10-CM

## 2022-05-14 NOTE — Telephone Encounter (Signed)
Benefits submitted Next injection due 05/07/22

## 2022-05-18 NOTE — Telephone Encounter (Signed)
PA approved Authorization Status Approved Authorization Number [id number]  Authorization Start Date 05-18-2022 Authorization End Date 05-19-2023  OOP cost is $305. Spoke with patient's husband-he was not at home and I advised that I would call back tomorrow to discuss scheduling

## 2022-05-19 MED ORDER — VITAMIN D3 1.25 MG (50000 UT) PO TABS: 1.0000 | ORAL_TABLET | ORAL | 1 refills | Status: DC

## 2022-05-19 NOTE — Telephone Encounter (Signed)
Spoke with Anastasiya - pt with difficulty taking daily pill (vit D). Will trial weekly vit D replacement.

## 2022-05-19 NOTE — Addendum Note (Signed)
Addended by: Kris Mouton on: 05/19/2022 12:46 PM   Modules accepted: Orders

## 2022-05-19 NOTE — Addendum Note (Signed)
Addended by: Ria Bush on: 05/19/2022 12:41 PM   Modules accepted: Orders

## 2022-05-19 NOTE — Telephone Encounter (Signed)
Patient's husband advised. Lab 05/20/22 and NV 05/26/22  Spoke with Dr Darnell Level, Vitamin D level was low last time. He does want this level repeated but also to start patient on Vitamin D RX strength once a week. They will try that. Mr Bosak verbalized understanding

## 2022-05-20 ENCOUNTER — Other Ambulatory Visit (INDEPENDENT_AMBULATORY_CARE_PROVIDER_SITE_OTHER): Payer: Medicare Other

## 2022-05-20 DIAGNOSIS — M810 Age-related osteoporosis without current pathological fracture: Secondary | ICD-10-CM

## 2022-05-20 DIAGNOSIS — E559 Vitamin D deficiency, unspecified: Secondary | ICD-10-CM

## 2022-05-20 LAB — BASIC METABOLIC PANEL
BUN: 20 mg/dL (ref 6–23)
CO2: 34 mEq/L — ABNORMAL HIGH (ref 19–32)
Calcium: 10.1 mg/dL (ref 8.4–10.5)
Chloride: 103 mEq/L (ref 96–112)
Creatinine, Ser: 0.78 mg/dL (ref 0.40–1.20)
GFR: 72.72 mL/min (ref >60.00)
Glucose, Bld: 81 mg/dL (ref 70–99)
Potassium: 4.2 mEq/L (ref 3.5–5.1)
Sodium: 142 mEq/L (ref 135–145)

## 2022-05-20 LAB — VITAMIN D 25 HYDROXY (VIT D DEFICIENCY, FRACTURES): VITD: 26.16 ng/mL — ABNORMAL LOW (ref 30.00–100.00)

## 2022-05-22 ENCOUNTER — Other Ambulatory Visit: Payer: Self-pay

## 2022-05-22 ENCOUNTER — Encounter: Payer: Self-pay | Admitting: Emergency Medicine

## 2022-05-22 ENCOUNTER — Emergency Department: Payer: Medicare Other

## 2022-05-22 ENCOUNTER — Emergency Department
Admission: EM | Admit: 2022-05-22 | Discharge: 2022-05-22 | Disposition: A | Discharge status: Home / Self Care | Payer: Medicare Other | Attending: Emergency Medicine | Admitting: Emergency Medicine

## 2022-05-22 DIAGNOSIS — Y92239 Unspecified place in hospital as the place of occurrence of the external cause: Secondary | ICD-10-CM | POA: Insufficient documentation

## 2022-05-22 DIAGNOSIS — W01198A Fall on same level from slipping, tripping and stumbling with subsequent striking against other object, initial encounter: Secondary | ICD-10-CM | POA: Diagnosis not present

## 2022-05-22 DIAGNOSIS — S0181XA Laceration without foreign body of other part of head, initial encounter: Secondary | ICD-10-CM | POA: Diagnosis not present

## 2022-05-22 DIAGNOSIS — W19XXXA Unspecified fall, initial encounter: Secondary | ICD-10-CM

## 2022-05-22 DIAGNOSIS — S0993XA Unspecified injury of face, initial encounter: Secondary | ICD-10-CM | POA: Diagnosis present

## 2022-05-22 DIAGNOSIS — F0390 Unspecified dementia without behavioral disturbance: Secondary | ICD-10-CM | POA: Insufficient documentation

## 2022-05-22 DIAGNOSIS — Z23 Encounter for immunization: Secondary | ICD-10-CM | POA: Diagnosis not present

## 2022-05-22 DIAGNOSIS — Z7901 Long term (current) use of anticoagulants: Secondary | ICD-10-CM | POA: Insufficient documentation

## 2022-05-22 DIAGNOSIS — Y9301 Activity, walking, marching and hiking: Secondary | ICD-10-CM | POA: Diagnosis not present

## 2022-05-22 MED ORDER — TETANUS-DIPHTH-ACELL PERTUSSIS 5-2.5-18.5 LF-MCG/0.5 IM SUSY
0.5000 mL | PREFILLED_SYRINGE | Freq: Once | INTRAMUSCULAR | Status: AC
  Administered 2022-05-22: 0.5 mL via INTRAMUSCULAR
  Filled 2022-05-22: qty 0.5

## 2022-05-22 NOTE — ED Notes (Signed)
Pt verbalized understanding of discharge instructions and follow-up care instructions. Pt advised if symptoms worsen to return to ED.  

## 2022-05-22 NOTE — ED Provider Notes (Signed)
[facility] Provider Note    Event Date/Time   First MD Initiated Contact with Patient 05/22/22 1522     (approximate)   History   Fall   HPI  Kimberly Davies is a 81 y.o. female with a past medical history of HDL, DVT on Eliquis and dementia who presents coming by husband for evaluation after patient fell and cut her chin on a table.  This occurred immediately prior to arrival.  Per husband they went to get checked out because it had some bleeding that he was not able to get under control just with some compression.  She otherwise has been at neurological baseline.  She denies any acute complaints but does not recall the exact details of the incident and is at her neurological baseline per husband at bedside.  He states that walking in a cafeteria when her foot caught on the carpet and she fell forward striking her chin against a table.  She did not pass out and has not complained of any pain since then.  No other recent falls or injuries or sick symptoms such as fevers, chills, cough, nausea, vomiting, diarrhea or rash or passing out episodes.    Past Medical History:  Diagnosis Date   Acute deep vein thrombosis (DVT) of distal end of left lower extremity ([place]) 10/04/2016   Acute DVT of the left gastrocnemius veins. Acute SVT of the left lesser saphenous vein, beginning approximately 0.4- 0.5cm from its take-off from the popliteal vein. (09/2016)   Dementia ([place])    DVT (deep venous thrombosis) ([place]) 09/2016   s/p 3 mo xarelto   Hyperlipidemia 05/2003   Osteoporosis 2008   took actonel for 5 yrs   Rectal bleeding 10/22/2016   xarelto related.      Physical Exam  Triage Vital Signs: ED Triage Vitals  Enc Vitals Group     BP 05/22/22 1414 117/70     Pulse Rate 05/22/22 1414 84     Resp 05/22/22 1414 18     Temp 05/22/22 1414 98.5 F (36.9 C)     Temp Source 05/22/22 1414 Oral     SpO2 05/22/22 1414 94 %     Weight 05/22/22 1415 110 lb (49.9 kg)      Height 05/22/22 1415 '5\' 4"'$  (1.626 m)     Head Circumference --      Peak Flow --      Pain Score --      Pain Loc --      Pain Edu? --      Excl. in [place]? --     Most recent vital signs: Vitals:   05/22/22 1414  BP: 117/70  Pulse: 84  Resp: 18  Temp: 98.5 F (36.9 C)  SpO2: 94%    General: Awake, no distress.  CV:  Good peripheral perfusion.  2+ radial pulses. Resp:  Normal effort.  Clear bilaterally. Abd:  No distention.  Soft throughout. Other:  0.5 cm linear hemostatic laceration with right mid chin.  CN II-XII grossly intact  No tenderness/deformities over the C/T/L spine  No focal TTP over b/l shoulders, elbows, wrists, hips, knees, ankles  2+ b/l radial and PD pulses   No other obvious trauma to face, scalp ,head, neck or torso    ED Results / Procedures / Treatments  Labs (all labs ordered are listed, but only abnormal results are displayed) Labs Reviewed - No data to display   EKG    RADIOLOGY CT  head, face, and C-spine my interpretation without evidence of skull fracture, facial fracture, intracranial hemorrhage or acute C-spine injury or any clear significant acute traumatic listhesis.  I also reviewed radiology's interpretation and agree to findings of some spondylosis with encroachment of neural foramina from C4-C7 with some bony spurs.   PROCEDURES:  Critical Care performed: No  ..Laceration Repair  Date/Time: 05/22/2022 3:48 PM  Performed by: Lucrezia Starch, MD Authorized by: Lucrezia Starch, MD   Consent:    Consent obtained:  Verbal   Consent given by:  Spouse   Risks discussed:  Infection, pain and need for additional repair   Alternatives discussed:  No treatment Universal protocol:    Procedure explained and questions answered to patient or proxy's satisfaction: no     Patient identity confirmed:  Verbally with patient Laceration details:    Location:  Face   Face location:  Chin   Length (cm):  0.5 Exploration:     Contaminated: no   Treatment:    Area cleansed with:  Saline   Amount of cleaning:  Extensive   Irrigation solution:  Sterile water   Irrigation method:  Syringe   Visualized foreign bodies/material removed: no     Debridement:  None   Undermining:  None   Scar revision: no   Skin repair:    Repair method:  Tissue adhesive Approximation:    Approximation:  Close Repair type:    Repair type:  Simple Post-procedure details:    Dressing:  Open (no dressing)   Procedure completion:  Tolerated well, no immediate complications     MEDICATIONS ORDERED IN ED: Medications  Tdap (BOOSTRIX) injection 0.5 mL (has no administration in time range)     IMPRESSION / MDM / ASSESSMENT AND PLAN / ED COURSE  I reviewed the triage vital signs and the nursing notes. Patient's presentation is most consistent with acute presentation with potential threat to life or bodily function.                               Differential diagnosis includes, but is not limited to superficial laceration, facial fracture, skull fracture, intracranial hemorrhage or occult C-spine injury.  Patient denies any other pain and no other evidence of trauma to the extremities or torso and have low suspicion for other significant visceral or orthopedic injury.  The fall was witnessed and has been describes a clear mechanical mechanism with a low suspicion for seizure or presyncopal symptoms preceding this.  Patient has no other recent sick symptoms.  She is not sure when her last tetanus wasupdated.  Wound cleaned.  CT head, face, and C-spine my interpretation without evidence of skull fracture, facial fracture, intracranial hemorrhage or acute C-spine injury or any clear significant acute traumatic listhesis.  I also reviewed radiology's interpretation and agree to findings of some spondylosis with encroachment of neural foramina from C4-C7 with some bony spurs.  Laceration repaired per procedure note above.  Patient and  family at bedside have no other concerns at this time.  Discharged in stable condition.  Strict return precautions advised and discussed.      FINAL CLINICAL IMPRESSION(S) / ED DIAGNOSES   Final diagnoses:  Fall, initial encounter  Chin laceration, initial encounter     Rx / DC Orders   ED Discharge Orders     None        Note:  This document was prepared using Dragon voice  recognition software and may include unintentional dictation errors.   Lucrezia Starch, MD 05/22/22 1550

## 2022-05-22 NOTE — ED Triage Notes (Signed)
Pt via POV from home. Pt was in the cafeteria and pt had a mechanical fall in the hit her chin on the table. Denies any other head injury. Denies any other pain. Denies LOC but does take Eliquis. Pt has a hx of dementia but husband states she is at her baseline.

## 2022-05-24 ENCOUNTER — Telehealth: Payer: Self-pay

## 2022-05-24 NOTE — Telephone Encounter (Signed)
[facility] - Client TELEPHONE ADVICE RECORD [facility] Patient Name: Kimberly Davies [place] Gender: Female DOB: Apr 30, 1941 Age: 81 Y 68 M 24 D Return Phone Number: [phone] (Primary), [phone] (Secondary) Address: City/ State/ Zip: [place] [address] Client [facility] - Client Client [facility] [place] Provider Ria Bush - MD Contact Type Call Who Is Calling Patient / Member / Family / Caregiver Call Type Triage / Clinical Caller Name Blessed Girdner Relationship To Patient Spouse Return Phone Number [phone] (Primary) Chief Complaint Face Injury Reason for Call Symptomatic / Request for [place] states his wife just fell and hit the table with her chin. She has about a half-inch gash in her chin. It did not bleed but then about 40 minutes later, it started bleeding. Husband says the bleeding has slowed down. Translation No Nurse Assessment Nurse: Altamease Oiler, RN, Adriana Date/Time (Eastern Time): 05/22/2022 1:02:11 PM Confirm and document reason for call. If symptomatic, describe symptoms. ---caller states pt fell over 1 hr ago. she hit her chin and has a split a little less than 1/2". bled, then placed a bandaid on it and it stopped bleeding. a while ago it started bleeding again, dripping. bleeding continues when pressure is removed. has been bleeding for 20 min. pt is on blood thinners. Does the patient have any new or worsening symptoms? ---Yes Will a triage be completed? ---Yes Related visit to physician within the last 2 weeks? ---No Does the PT have any chronic conditions? (i.e. diabetes, asthma, this includes High risk factors for pregnancy, etc.) ---Yes List chronic conditions. ---dementia dvt Is this a behavioral health or substance abuse call? ---No Guidelines Guideline Title Affirmed Question Affirmed Notes Nurse Date/Time  (Eastern Time) Cuts and Lacerations [1] Bleeding AND [2] won't stop after 10 minutes of direct Altamease Oiler, Therapist, sports, Adriana 05/22/2022 1:06:06 PM PLEASE NOTE: All timestamps contained within this report are represented as [place] Standard Time. CONFIDENTIALTY NOTICE: This fax transmission is intended only for the addressee. It contains information that is legally privileged, confidential or otherwise protected from use or disclosure. If you are not the intended recipient, you are strictly prohibited from reviewing, disclosing, copying using or disseminating any of this information or taking any action in reliance on or regarding this information. If you have received this fax in error, please notify us immediately by telephone so that we can arrange for its return to [place]. Phone: [phone], Toll-Free: [phone], Fax: [phone] Page: 2 of 2 Call Id: [id number] Guidelines Guideline Title Affirmed Question Affirmed Notes Nurse Date/Time [name] Time) pressure (using correct technique) Disp. Time [name] Time) Disposition Final User 05/22/2022 1:07:25 PM Go to ED Now Yes Altamease Oiler, RN, Adriana Caller Disagree/Comply Comply Caller Understands Yes PreDisposition Call Doctor Care Advice Given Per Guideline GO TO ED NOW: CARE ADVICE given per Cuts and Lacerations (Adult) guideline. CONTINUE DIRECT PRESSURE FOR BLEEDING: * Put direct pressure on the wound to stop any bleeding. * Use a clean cloth or gauze pad. Press down firmly with your fingers over the bleeding area. * Continue doing this until seen. Referrals [place]

## 2022-05-24 NOTE — Telephone Encounter (Signed)
Per chart review tab pt was seen at [facility] ED on 05/22/22. Sending to Dr Damita Dunnings since Dr Darnell Level is out of office and [facility] CMA.

## 2022-05-25 NOTE — Telephone Encounter (Signed)
Noted.  Please check on patient in the meantime.  Thanks.

## 2022-05-25 NOTE — Telephone Encounter (Signed)
Spoke with pt's husband, [name] (on dpr) asking for update. States she is doing ok. Did not and would not offer any further info.  Scheduled ER f/u on 06/02/22 at 11:00. Fyi to Dr. Damita Dunnings.

## 2022-05-25 NOTE — Telephone Encounter (Signed)
Noted. Thanks.

## 2022-05-26 ENCOUNTER — Ambulatory Visit (INDEPENDENT_AMBULATORY_CARE_PROVIDER_SITE_OTHER): Payer: Medicare Other | Admitting: *Deleted

## 2022-05-26 DIAGNOSIS — M810 Age-related osteoporosis without current pathological fracture: Secondary | ICD-10-CM

## 2022-05-26 MED ORDER — DENOSUMAB 60 MG/ML ~~LOC~~ SOSY
60.0000 mg | PREFILLED_SYRINGE | Freq: Once | SUBCUTANEOUS | Status: AC
  Administered 2022-05-26: 60 mg via SUBCUTANEOUS

## 2022-05-26 NOTE — Progress Notes (Signed)
Per orders of Dr. Glori Bickers in absence of Dr. Danise Mina, injection of Prolia given in Right Arm by Lauralyn Primes.  Patient tolerated injection well.

## 2022-05-26 NOTE — Telephone Encounter (Signed)
CrCl is 46.83 mL/min. Calcium normal at 10.1

## 2022-06-02 ENCOUNTER — Ambulatory Visit (INDEPENDENT_AMBULATORY_CARE_PROVIDER_SITE_OTHER): Payer: Medicare Other | Admitting: Family Medicine

## 2022-06-02 ENCOUNTER — Encounter: Payer: Self-pay | Admitting: Family Medicine

## 2022-06-02 VITALS — BP 120/82 | HR 71 | Temp 97.6°F | Ht 64.0 in | Wt 116.0 lb

## 2022-06-02 DIAGNOSIS — S0180XA Unspecified open wound of other part of head, initial encounter: Secondary | ICD-10-CM | POA: Diagnosis not present

## 2022-06-02 DIAGNOSIS — G309 Alzheimer's disease, unspecified: Secondary | ICD-10-CM

## 2022-06-02 DIAGNOSIS — W19XXXA Unspecified fall, initial encounter: Secondary | ICD-10-CM

## 2022-06-02 DIAGNOSIS — F02B18 Dementia in other diseases classified elsewhere, moderate, with other behavioral disturbance: Secondary | ICD-10-CM | POA: Diagnosis not present

## 2022-06-02 MED ORDER — CEPHALEXIN 500 MG PO CAPS: 500.0000 mg | ORAL_CAPSULE | Freq: Three times a day (TID) | ORAL | 0 refills | Status: DC

## 2022-06-02 NOTE — Progress Notes (Incomplete)
Patient ID: Kimberly Davies, female    DOB: [birth year], 81 y.o.   MRN: [id number]  This visit was conducted in person.  BP 120/82   Pulse 71   Temp 97.6 F (36.4 C) (Temporal)   Ht '5\' 4"'$  (1.626 m)   Wt 116 lb (52.6 kg)   SpO2 99%   BMI 19.91 kg/m    CC: ER f/u visit  Subjective:   HPI: Kimberly Davies is a 81 y.o. female presenting on 06/02/2022 for Hospitalization Follow-up (Seen on 05/22/22 at [facility] ED, dx fall; chin laceration.  Pt accompanied by husband, [name]. )   Husband supplements history due to dementia.   DOI: 05/22/2022  Recent fall suffered chin laceration while walking in cafeteria tripped over carpet. Seen at ER on same date, CT imaging reassuringly without fracture or bleed (head and C-spine), treated with dermaglue. Tdap updated.   Last week she pulled scab off, husband states pus came out of this. Pt states currently no pain.   Chronic low dose eliquis use 2.'5mg'$  bid followed by hematology, due to h/o recurrent LLE DVT. Sees heme Dr Tasia Catchings 07/2022.   Some vision changes, difficulty getting correct prescription due to difficulty following instructions due to dementia.      Relevant past medical, surgical, family and social history reviewed and updated as indicated. Interim medical history since our last visit reviewed. Allergies and medications reviewed and updated. Outpatient Medications Prior to Visit  Medication Sig Dispense Refill  . apixaban (ELIQUIS) 2.5 MG TABS tablet Take 1 tablet (2.5 mg total) by mouth 2 (two) times daily. 60 tablet 6  . Cholecalciferol (VITAMIN D3) 1.25 MG (50000 UT) TABS Take 1 tablet by mouth once a week. 12 tablet 1  . denosumab (PROLIA) 60 MG/ML SOSY injection Inject 60 mg into the skin every 6 (six) months.    . memantine (NAMENDA) 10 MG tablet TAKE 1 TABLET BY MOUTH TWICE A DAY 180 tablet 0  . QUEtiapine (SEROQUEL) 25 MG tablet TAKE 1 TABLET BY MOUTH EVERYDAY AT BEDTIME (Patient taking differently: Takes 2 tablets at bedtime) 90 tablet  1  . sertraline (ZOLOFT) 50 MG tablet TAKE 1 TABLET BY MOUTH EVERY DAY 90 tablet 0  . vitamin B-12 (CYANOCOBALAMIN) 1000 MCG tablet Take 1 tablet (1,000 mcg total) by mouth every Monday, Wednesday, and Friday.    . vitamin E 1000 UNIT capsule Take 1 capsule (1,000 Units total) by mouth in the morning and at bedtime.     No facility-administered medications prior to visit.     Per HPI unless specifically indicated in ROS section below Review of Systems  Objective:  BP 120/82   Pulse 71   Temp 97.6 F (36.4 C) (Temporal)   Ht '5\' 4"'$  (1.626 m)   Wt 116 lb (52.6 kg)   SpO2 99%   BMI 19.91 kg/m   Wt Readings from Last 3 Encounters:  06/02/22 116 lb (52.6 kg)  05/22/22 110 lb (49.9 kg)  02/01/22 109 lb 6.4 oz (49.6 kg)      Physical Exam    Results for orders placed or performed in visit on 05/20/22  Vitamin D, 25-hydroxy  Result Value Ref Range   VITD 26.16 (L) 30.00 - 100.00 ng/mL  Basic Metabolic Panel  Result Value Ref Range   Sodium 142 135 - 145 mEq/L   Potassium 4.2 3.5 - 5.1 mEq/L   Chloride 103 96 - 112 mEq/L   CO2 34 (H) 19 - 32 mEq/L  Glucose, Bld 81 70 - 99 mg/dL   BUN 20 6 - 23 mg/dL   Creatinine, Ser 0.78 0.40 - 1.20 mg/dL   GFR 72.72 >60.00 mL/min   Calcium 10.1 8.4 - 10.5 mg/dL    Assessment & Plan:   Problem List Items Addressed This Visit   None    No orders of the defined types were placed in this encounter.  No orders of the defined types were placed in this encounter.    There are no Patient Instructions on file for this visit.  Follow up plan: No follow-ups on file.  Ria Bush, MD

## 2022-06-02 NOTE — Patient Instructions (Addendum)
Take keflex antibiotic three times a day for 5 days to cover any ongoing infection after recent chin cut.  Try warm compresses to chin to speed healing as well.  Good to see you today.

## 2022-06-03 DIAGNOSIS — S0180XA Unspecified open wound of other part of head, initial encounter: Secondary | ICD-10-CM | POA: Insufficient documentation

## 2022-06-03 NOTE — Assessment & Plan Note (Addendum)
Small, seems to be healing well. Sounds like she removed dermabond and scab on her own. Concern for infection given description of pus draining after she removed scab a few days ago. Will therefore cover with keflex course. Today wound looks ok without significant erythema or drainage. There is some ongoing edema/induration around wound.

## 2022-06-03 NOTE — Assessment & Plan Note (Addendum)
Complicates care.  Seroquel increased to '50mg'$  by neurology.

## 2022-06-03 NOTE — Assessment & Plan Note (Addendum)
2nd fall in last 6 months.  This fall happened at [employer] when patient tripped over carpet.  Will need to reassess risks/benefits of anticoagulant.  She has upcoming hematology f/u scheduled later this summer.

## 2022-06-03 NOTE — Progress Notes (Signed)
Patient ID: Kimberly Davies, female    DOB: [birth year], 81 y.o.   MRN: [id number]  This visit was conducted in person.  BP 120/82   Pulse 71   Temp 97.6 F (36.4 C) (Temporal)   Ht '5\' 4"'$  (1.626 m)   Wt 116 lb (52.6 kg)   SpO2 99%   BMI 19.91 kg/m    CC: ER f/u visit  Subjective:   HPI: Kimberly Davies is a 81 y.o. female presenting on 06/02/2022 for Hospitalization Follow-up (Seen on 05/22/22 at [facility] ED, dx fall; chin laceration.  Pt accompanied by husband, [name]. )   Husband supplements history due to dementia.   DOI: 05/22/2022  Recent fall suffered chin laceration while walking in cafeteria tripped over carpet. Seen at ER on same date, CT imaging reassuringly without fracture or bleed (head and C-spine), treated with dermaglue. Tdap updated.   Last week she pulled scab off, husband states pus came out of this. Pt states currently no pain.   Chronic low dose eliquis use 2.'5mg'$  bid followed by hematology, due to h/o recurrent LLE DVT. Sees heme Dr Tasia Catchings 07/2022.   Some vision changes, difficulty getting correct prescription due to difficulty following instructions due to dementia.      Relevant past medical, surgical, family and social history reviewed and updated as indicated. Interim medical history since our last visit reviewed. Allergies and medications reviewed and updated. Outpatient Medications Prior to Visit  Medication Sig Dispense Refill   apixaban (ELIQUIS) 2.5 MG TABS tablet Take 1 tablet (2.5 mg total) by mouth 2 (two) times daily. 60 tablet 6   Cholecalciferol (VITAMIN D3) 1.25 MG (50000 UT) TABS Take 1 tablet by mouth once a week. 12 tablet 1   denosumab (PROLIA) 60 MG/ML SOSY injection Inject 60 mg into the skin every 6 (six) months.     memantine (NAMENDA) 10 MG tablet TAKE 1 TABLET BY MOUTH TWICE A DAY 180 tablet 0   sertraline (ZOLOFT) 50 MG tablet TAKE 1 TABLET BY MOUTH EVERY DAY 90 tablet 0   vitamin B-12 (CYANOCOBALAMIN) 1000 MCG tablet Take 1 tablet (1,000  mcg total) by mouth every Monday, Wednesday, and Friday.     vitamin E 1000 UNIT capsule Take 1 capsule (1,000 Units total) by mouth in the morning and at bedtime.     QUEtiapine (SEROQUEL) 25 MG tablet TAKE 1 TABLET BY MOUTH EVERYDAY AT BEDTIME (Patient taking differently: Takes 2 tablets at bedtime) 90 tablet 1   QUEtiapine (SEROQUEL) 25 MG tablet Take 2 tablets (50 mg total) by mouth at bedtime.     No facility-administered medications prior to visit.     Per HPI unless specifically indicated in ROS section below Review of Systems  Objective:  BP 120/82   Pulse 71   Temp 97.6 F (36.4 C) (Temporal)   Ht '5\' 4"'$  (1.626 m)   Wt 116 lb (52.6 kg)   SpO2 99%   BMI 19.91 kg/m   Wt Readings from Last 3 Encounters:  06/02/22 116 lb (52.6 kg)  05/22/22 110 lb (49.9 kg)  02/01/22 109 lb 6.4 oz (49.6 kg)      Physical Exam Vitals and nursing note reviewed.  Constitutional:      Appearance: Normal appearance. She is not ill-appearing.  HENT:     Head: Normocephalic.      Comments: Area of laceration with surrounding edema without erythema, nothing drains when area pressed    Mouth/Throat:     Dentition:  Abnormal dentition.  Cardiovascular:     Rate and Rhythm: Normal rate and regular rhythm.     Pulses: Normal pulses.     Heart sounds: Normal heart sounds. No murmur heard. Pulmonary:     Effort: Pulmonary effort is normal. No respiratory distress.     Breath sounds: Normal breath sounds. No wheezing, rhonchi or rales.  Musculoskeletal:     Right lower leg: No edema.     Left lower leg: No edema.  Skin:    General: Skin is warm and dry.     Findings: No rash.  Neurological:     Mental Status: She is alert.  Psychiatric:        Mood and Affect: Mood normal.        Behavior: Behavior normal.         Assessment & Plan:   Problem List Items Addressed This Visit     Alzheimer's dementia ([place])    Complicates care.  Seroquel increased to '50mg'$  by neurology.        Relevant Medications   QUEtiapine (SEROQUEL) 25 MG tablet   Fall with injury    2nd fall in last 6 months.  This fall happened at [employer] when patient tripped over carpet.  Will need to reassess risks/benefits of anticoagulant.  She has upcoming hematology f/u scheduled later this summer.      Open wound of chin - Primary    Small, seems to be healing well. Sounds like she removed dermabond and scab on her own. Concern for infection given description of pus draining after she removed scab a few days ago. Will therefore cover with keflex course. Today wound looks ok without significant erythema or drainage. There is some ongoing edema/induration around wound.         Meds ordered this encounter  Medications   cephALEXin (KEFLEX) 500 MG capsule    Sig: Take 1 capsule (500 mg total) by mouth 3 (three) times daily.    Dispense:  15 capsule    Refill:  0    No orders of the defined types were placed in this encounter.    Patient Instructions  Take keflex antibiotic three times a day for 5 days to cover any ongoing infection after recent chin cut.  Try warm compresses to chin to speed healing as well.  Good to see you today.   Follow up plan: Return if symptoms worsen or fail to improve.  Ria Bush, MD

## 2022-06-10 ENCOUNTER — Other Ambulatory Visit: Payer: Self-pay | Admitting: Family Medicine

## 2022-06-16 ENCOUNTER — Ambulatory Visit (INDEPENDENT_AMBULATORY_CARE_PROVIDER_SITE_OTHER): Payer: Medicare Other | Admitting: Family Medicine

## 2022-06-16 ENCOUNTER — Encounter: Payer: Self-pay | Admitting: Family Medicine

## 2022-06-16 VITALS — BP 108/80 | HR 78 | Temp 97.6°F | Ht 62.25 in | Wt 117.1 lb

## 2022-06-16 DIAGNOSIS — S0180XD Unspecified open wound of other part of head, subsequent encounter: Secondary | ICD-10-CM

## 2022-06-16 DIAGNOSIS — F02B18 Dementia in other diseases classified elsewhere, moderate, with other behavioral disturbance: Secondary | ICD-10-CM

## 2022-06-16 DIAGNOSIS — F411 Generalized anxiety disorder: Secondary | ICD-10-CM

## 2022-06-16 DIAGNOSIS — Z0001 Encounter for general adult medical examination with abnormal findings: Secondary | ICD-10-CM

## 2022-06-16 DIAGNOSIS — E559 Vitamin D deficiency, unspecified: Secondary | ICD-10-CM

## 2022-06-16 DIAGNOSIS — I82402 Acute embolism and thrombosis of unspecified deep veins of left lower extremity: Secondary | ICD-10-CM

## 2022-06-16 DIAGNOSIS — E782 Mixed hyperlipidemia: Secondary | ICD-10-CM

## 2022-06-16 DIAGNOSIS — F05 Delirium due to known physiological condition: Secondary | ICD-10-CM

## 2022-06-16 DIAGNOSIS — Z7901 Long term (current) use of anticoagulants: Secondary | ICD-10-CM

## 2022-06-16 DIAGNOSIS — M810 Age-related osteoporosis without current pathological fracture: Secondary | ICD-10-CM

## 2022-06-16 DIAGNOSIS — Z7189 Other specified counseling: Secondary | ICD-10-CM

## 2022-06-16 DIAGNOSIS — E538 Deficiency of other specified B group vitamins: Secondary | ICD-10-CM

## 2022-06-16 DIAGNOSIS — D649 Anemia, unspecified: Secondary | ICD-10-CM

## 2022-06-16 DIAGNOSIS — R7989 Other specified abnormal findings of blood chemistry: Secondary | ICD-10-CM

## 2022-06-16 DIAGNOSIS — Z0000 Encounter for general adult medical examination without abnormal findings: Secondary | ICD-10-CM

## 2022-06-16 DIAGNOSIS — G309 Alzheimer's disease, unspecified: Secondary | ICD-10-CM

## 2022-06-16 NOTE — Assessment & Plan Note (Signed)
Continue weekly prescription replacement.

## 2022-06-16 NOTE — Progress Notes (Signed)
Patient ID: Kimberly Davies, female    DOB: 1940-12-21, 81 y.o.   MRN: [id number]  This visit was conducted in person.  BP 108/80   Pulse 78   Temp 97.6 F (36.4 C) (Temporal)   Ht 5' 2.25" (1.581 m)   Wt 117 lb 2 oz (53.1 kg)   SpO2 95%   BMI 21.25 kg/m    CC: AMW  Subjective:   HPI: Kimberly Davies is a 81 y.o. female presenting on 06/16/2022 for Medicare Wellness (Pt accompanied by husband, Kimberly Davies.)   Did not see health advisor.  Hearing Screening - Comments:: Pt not able to follow commands for hearing test.  Vision Screening - Comments:: No eye exam in past yr due to not able to follow commands from doc.   Flowsheet Row Clinical Support from 03/27/2021 in [place] [facility] [place]  PHQ-2 Total Score 0          06/16/2022    3:03 PM 03/27/2021    9:37 AM 05/03/2019    8:15 AM 04/17/2018    9:09 AM 04/13/2017    9:18 AM  Fall Risk   Falls in the past year? 0 0 0 No No  Number falls in past yr: 1 0     Injury with Fall? 1 0     Comment Injury to chin      Risk for fall due to :  Medication side effect     Follow up  Falls evaluation completed;Falls prevention discussed      Husband supplements history due to dementia.   Recent fall at [employer] with subsequent laceration to chin s/p dermabond at ER. 2nd fall this year. I placed her on keflex '500mg'$  TID 5d course given concern for possible infection. Some persistent swelling to right chin without tenderness or itching to area. Possible vision difficulty contributing - has been unable to complete eye exam due to dementia.   Chronic low dose eliquis use 2.'5mg'$  bid followed by hematology, due to h/o recurrent LLE DVT. Sees heme Dr Kimberly Davies 07/2022, to discuss ongoing [facility].   Dementia - seeing neurology Dr Kimberly Davies - continues namenda '10mg'$  bid with seroquel '50mg'$  nightly for sundowning. Ongoing anxiety, on sertraline '50mg'$  daily.   Preventative: COLONOSCOPY Date: 08/2013 small diverticula o/w normal, rpt 10 yrs Kimberly Davies) -  will age out Well woman exam - aged out - no fmhx cervical cancer.  Mammo - 12/2017 Birads1 - decided to age out  Dexa 11/2016 T score spine -2.7 and hip -2.9.  Last prolia injection 05/26/2022.  Lung cancer screening - not eligible  Flu shot yearly  COVID vaccine Pfizer 01/2020, 02/2020, booster 10/2020, bivalent 09/2021 Pneumovax 2011, [id number] 2015 Tetanus 2009, Tdap 05/2022. Zostavax 2012  Shingrix discussed Advanced directives: think they have info at home - will check. Husband and sons are POA.  Seat belt use discussed Sunscreen use discussed. No changing moles on skin.  Non smoker  Alcohol - none  Eye exam - not recently Dentist - q6 mo - seen last month  Bladder - no urinary incontinence  Bowels - no constipation    Caffeine: none Lives with husband, 2 grown children. 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 Interim medical history since our last visit reviewed. Allergies and medications reviewed and  updated. Outpatient Medications Prior to Visit  Medication Sig Dispense Refill   apixaban (ELIQUIS) 2.5 MG TABS tablet Take 1 tablet (2.5 mg total) by mouth 2 (two) times daily. 60 tablet 6   Cholecalciferol (VITAMIN D3) 1.25 MG (50000 UT) TABS Take 1 tablet by mouth once a week. 12 tablet 1   denosumab (PROLIA) 60 MG/ML SOSY injection Inject 60 mg into the skin every 6 (six) months.     memantine (NAMENDA) 10 MG tablet TAKE 1 TABLET BY MOUTH TWICE A DAY 180 tablet 0   QUEtiapine (SEROQUEL) 25 MG tablet Take 2 tablets (50 mg total) by mouth at bedtime.     sertraline (ZOLOFT) 50 MG tablet TAKE 1 TABLET BY MOUTH EVERY DAY 90 tablet 0   vitamin B-12 (CYANOCOBALAMIN) 1000 MCG tablet Take 1 tablet (1,000 mcg total) by mouth every Monday, Wednesday, and Friday.     vitamin E 1000 UNIT  capsule Take 1 capsule (1,000 Units total) by mouth in the morning and at bedtime.     cephALEXin (KEFLEX) 500 MG capsule Take 1 capsule (500 mg total) by mouth 3 (three) times daily. 15 capsule 0   No facility-administered medications prior to visit.     Per HPI unless specifically indicated in ROS section below Review of Systems  Unable to perform ROS: Dementia    Objective:  BP 108/80   Pulse 78   Temp 97.6 F (36.4 C) (Temporal)   Ht 5' 2.25" (1.581 m)   Wt 117 lb 2 oz (53.1 kg)   SpO2 95%   BMI 21.25 kg/m   Wt Readings from Last 3 Encounters:  06/16/22 117 lb 2 oz (53.1 kg)  06/02/22 116 lb (52.6 kg)  05/22/22 110 lb (49.9 kg)      Physical Exam Vitals and nursing note reviewed.  Constitutional:      Appearance: Normal appearance. She is not ill-appearing.  HENT:     Head: Normocephalic and atraumatic.     Right Ear: Tympanic membrane, ear canal and external ear normal. There is no impacted cerumen.     Left Ear: Tympanic membrane, ear canal and external ear normal. There is no impacted cerumen.  Eyes:     General:        Right eye: No discharge.        Left eye: No discharge.     Extraocular Movements: Extraocular movements intact.     Conjunctiva/sclera: Conjunctivae normal.     Pupils: Pupils are equal, round, and reactive to light.  Neck:     Thyroid: No thyroid mass or thyromegaly.     Vascular: No carotid bruit.  Cardiovascular:     Rate and Rhythm: Normal rate and regular rhythm.     Pulses: Normal pulses.     Heart sounds: Normal heart sounds. No murmur heard. Pulmonary:     Effort: Pulmonary effort is normal. No respiratory distress.     Breath sounds: Normal breath sounds. No wheezing, rhonchi or rales.  Abdominal:     General: Bowel sounds are normal. There is no distension.     Palpations: Abdomen is soft. There is no mass.     Tenderness: There is no abdominal tenderness. There is no guarding or rebound.     Hernia: No hernia is present.   Musculoskeletal:     Cervical back: Normal range of motion and neck supple. No rigidity.     Right lower leg: No edema.     Left lower leg: No edema.  Lymphadenopathy:     Cervical: No cervical adenopathy.  Skin:    General: Skin is warm and dry.     Findings: No rash.  Neurological:     General: No focal deficit present.     Mental Status: She is alert. Mental status is at baseline.  Psychiatric:        Mood and Affect: Mood normal.        Behavior: Behavior normal.       Results for orders placed or performed in visit on 05/20/22  Vitamin D, 25-hydroxy  Result Value Ref Range   VITD 26.16 (L) 30.00 - 100.00 ng/mL  Basic Metabolic Panel  Result Value Ref Range   Sodium 142 135 - 145 mEq/L   Potassium 4.2 3.5 - 5.1 mEq/L   Chloride 103 96 - 112 mEq/L   CO2 34 (H) 19 - 32 mEq/L   Glucose, Bld 81 70 - 99 mg/dL   BUN 20 6 - 23 mg/dL   Creatinine, Ser 0.78 0.40 - 1.20 mg/dL   GFR 72.72 >60.00 mL/min   Calcium 10.1 8.4 - 10.5 mg/dL    Assessment & Plan:   Problem List Items Addressed This Visit     Medicare annual wellness visit, subsequent - Primary (Chronic)    I have personally reviewed the Medicare Annual Wellness questionnaire and have noted 1. The patient's medical and social history 2. Their use of alcohol, tobacco or illicit drugs 3. Their current medications and supplements 4. The patient's functional ability including ADL's, fall risks, home safety risks and hearing or visual impairment. Cognitive function has been assessed and addressed as indicated.  5. Diet and physical activity 6. Evidence for depression or mood disorders The patients weight, height, BMI have been recorded in the chart. I have made referrals, counseling and provided education to the patient based on review of the above and I have provided the pt with a written personalized care plan for preventive services. Provider list updated.. See scanned questionairre as needed for further  documentation. Reviewed preventative protocols and updated unless pt declined.       Advanced care planning/counseling discussion (Chronic)    Advanced directives: think they have info at home - will check. Husband and sons are POA.       Encounter for general adult medical examination with abnormal findings (Chronic)    Preventative protocols reviewed and updated unless pt declined. Discussed healthy diet and lifestyle.      HLD (hyperlipidemia)    Chronic, stable period off medications. Discussed diet choices to improve cholesterol levels. The 10-year ASCVD risk score (Arnett DK, et al., 2019) is: 16.3%   Values used to calculate the score:     Age: 71 years     Sex: Female     Is Non-Hispanic African American: No     Diabetic: No     Tobacco smoker: No     Systolic Blood Pressure: 664 mmHg     Is BP treated: No     HDL Cholesterol: 60.8 mg/dL     Total Cholesterol: 225 mg/dL       Anemia    Minimal. Continue to monitor.       Anxiety state    Stable period on sertraline '50mg'$  daily.       Osteoporosis    Received Prolia injection last month, continue every 6 month Prolia. Last DEXA 2017-consider updating. Completed 7-8 years of bisphosphonate therapy.      Vitamin D deficiency  Continue weekly prescription replacement.      Vitamin B12 deficiency    Stable period on vitamin B-12 replacement 1000 mcg Monday Wednesday Friday.      Abnormal TSH    Thyroid function remains stable off of medication.      Alzheimer's dementia [facility])    Husband is primary caregiver. They continue Namenda 10 mg twice daily as well as sertraline 50 mg daily and Seroquel 50 mg at bedtime. Followed by neurology Dr. Melrose Davies.      Recurrent acute deep vein thrombosis (DVT) of left lower extremity (HCC)    Continues low-dose Eliquis 2.5 mg twice daily, upcoming hematology appointment scheduled for next month.      Sundowning    Continue Seroquel 50 mg nightly.      Chronic  anticoagulation    They will need to discuss risks versus benefits of ongoing anticoagulation in setting of recent falls with injury.      Open wound of chin    This has largely healed well, with residual induration to the right chin.        No orders of the defined types were placed in this encounter.  No orders of the defined types were placed in this encounter.    Patient instructions: If interested, check with pharmacy about new 2 shot shingles series (shingrix).  Work on Ecologist.  Work on low cholesterol diet, good fiber and legumes can help lower LDL bad cholesterol levels.  You are doing well today. Return as needed or in 6 months for follow up visit.   Follow up plan: Return in about 6 months (around 12/17/2022) for follow up visit.  Ria Bush, MD

## 2022-06-16 NOTE — Assessment & Plan Note (Signed)
This has largely healed well, with residual induration to the right chin.

## 2022-06-16 NOTE — Assessment & Plan Note (Signed)
Minimal. Continue to monitor.

## 2022-06-16 NOTE — Assessment & Plan Note (Signed)
Husband is primary caregiver. They continue Namenda 10 mg twice daily as well as sertraline 50 mg daily and Seroquel 50 mg at bedtime. Followed by neurology Dr. Melrose Nakayama.

## 2022-06-16 NOTE — Assessment & Plan Note (Signed)
Stable period on sertraline '50mg'$  daily.

## 2022-06-16 NOTE — Assessment & Plan Note (Signed)
Chronic, stable period off medications. Discussed diet choices to improve cholesterol levels. The 10-year ASCVD risk score (Arnett DK, et al., 2019) is: 16.3%   Values used to calculate the score:     Age: 81 years     Sex: Female     Is Non-Hispanic African American: No     Diabetic: No     Tobacco smoker: No     Systolic Blood Pressure: 932 mmHg     Is BP treated: No     HDL Cholesterol: 60.8 mg/dL     Total Cholesterol: 225 mg/dL

## 2022-06-16 NOTE — Assessment & Plan Note (Addendum)
Thyroid function remains stable off of medication.

## 2022-06-16 NOTE — Assessment & Plan Note (Addendum)
Preventative protocols reviewed and updated unless pt declined. Discussed healthy diet and lifestyle.  

## 2022-06-16 NOTE — Assessment & Plan Note (Signed)
Advanced directives: think they have info at home - will check. Husband and sons are POA.

## 2022-06-16 NOTE — Assessment & Plan Note (Signed)
Continues low-dose Eliquis 2.5 mg twice daily, upcoming hematology appointment scheduled for next month.

## 2022-06-16 NOTE — Assessment & Plan Note (Signed)
Stable period on vitamin B-12 replacement 1000 mcg Monday Wednesday Friday.

## 2022-06-16 NOTE — Patient Instructions (Addendum)
If interested, check with pharmacy about new 2 shot shingles series (shingrix).  Work on Ecologist.  Work on low cholesterol diet, good fiber and legumes can help lower LDL bad cholesterol levels.  You are doing well today. Return as needed or in 6 months for follow up visit.   Health Maintenance After Age 79 After age 96, you are at a higher risk for certain long-term diseases and infections as well as injuries from falls. Falls are a major cause of broken bones and head injuries in people who are older than age 79. Getting regular preventive care can help to keep you healthy and well. Preventive care includes getting regular testing and making lifestyle changes as recommended by your health care provider. Talk with your health care provider about: Which screenings and tests you should have. A screening is a test that checks for a disease when you have no symptoms. A diet and exercise plan that is right for you. What should I know about screenings and tests to prevent falls? Screening and testing are the best ways to find a health problem early. Early diagnosis and treatment give you the best chance of managing medical conditions that are common after age 79. Certain conditions and lifestyle choices may make you more likely to have a fall. Your health care provider may recommend: Regular vision checks. Poor vision and conditions such as cataracts can make you more likely to have a fall. If you wear glasses, make sure to get your prescription updated if your vision changes. Medicine review. Work with your health care provider to regularly review all of the medicines you are taking, including over-the-counter medicines. Ask your health care provider about any side effects that may make you more likely to have a fall. Tell your health care provider if any medicines that you take make you feel dizzy or sleepy. Strength and balance checks. Your health care provider may recommend certain  tests to check your strength and balance while standing, walking, or changing positions. Foot health exam. Foot pain and numbness, as well as not wearing proper footwear, can make you more likely to have a fall. Screenings, including: Osteoporosis screening. Osteoporosis is a condition that causes the bones to get weaker and break more easily. Blood pressure screening. Blood pressure changes and medicines to control blood pressure can make you feel dizzy. Depression screening. You may be more likely to have a fall if you have a fear of falling, feel depressed, or feel unable to do activities that you used to do. Alcohol use screening. Using too much alcohol can affect your balance and may make you more likely to have a fall. Follow these instructions at home: Lifestyle Do not drink alcohol if: Your health care provider tells you not to drink. If you drink alcohol: Limit how much you have to: 0-1 drink a day for women. 0-2 drinks a day for men. Know how much alcohol is in your drink. In the [place] one drink equals one 12 oz bottle of beer (355 mL), one 5 oz glass of wine (148 mL), or one 1 oz glass of hard liquor (44 mL). Do not use any products that contain nicotine or tobacco. These products include cigarettes, chewing tobacco, and vaping devices, such as e-cigarettes. If you need help quitting, ask your health care provider. Activity  Follow a regular exercise program to stay fit. This will help you maintain your balance. Ask your health care provider what types of exercise are appropriate for  you. If you need a cane or walker, use it as recommended by your health care provider. Wear supportive shoes that have nonskid soles. Safety  Remove any tripping hazards, such as rugs, cords, and clutter. Install safety equipment such as grab bars in bathrooms and safety rails on stairs. Keep rooms and walkways well-lit. General instructions Talk with your health care provider about your risks for  falling. Tell your health care provider if: You fall. Be sure to tell your health care provider about all falls, even ones that seem minor. You feel dizzy, tiredness (fatigue), or off-balance. Take over-the-counter and prescription medicines only as told by your health care provider. These include supplements. Eat a healthy diet and maintain a healthy weight. A healthy diet includes low-fat dairy products, low-fat (lean) meats, and fiber from whole grains, beans, and lots of fruits and vegetables. Stay current with your vaccines. Schedule regular health, dental, and eye exams. Summary Having a healthy lifestyle and getting preventive care can help to protect your health and wellness after age 79. Screening and testing are the best way to find a health problem early and help you avoid having a fall. Early diagnosis and treatment give you the best chance for managing medical conditions that are more common for people who are older than age 79. Falls are a major cause of broken bones and head injuries in people who are older than age 79. Take precautions to prevent a fall at home. Work with your health care provider to learn what changes you can make to improve your health and wellness and to prevent falls. This information is not intended to replace advice given to you by your health care provider. Make sure you discuss any questions you have with your health care provider. Document Revised: 04/13/2021 Document Reviewed: 04/13/2021 Elsevier Patient Education  [place].

## 2022-06-16 NOTE — Assessment & Plan Note (Signed)

## 2022-06-16 NOTE — Assessment & Plan Note (Signed)
They will need to discuss risks versus benefits of ongoing anticoagulation in setting of recent falls with injury.

## 2022-06-16 NOTE — Assessment & Plan Note (Signed)
Continue Seroquel 50 mg nightly.

## 2022-06-16 NOTE — Assessment & Plan Note (Addendum)
Received Prolia injection last month, continue every 6 month Prolia. Last DEXA 2017-consider updating. Completed 7-8 years of bisphosphonate therapy.

## 2022-07-03 ENCOUNTER — Other Ambulatory Visit: Payer: Self-pay | Admitting: Family Medicine

## 2022-07-05 NOTE — Telephone Encounter (Signed)
ERx 

## 2022-07-05 NOTE — Telephone Encounter (Signed)
Namenda Last filled:  04/05/22, #180 Last OV:  06/16/22, AWV Next OV:  none

## 2022-07-08 ENCOUNTER — Telehealth: Payer: Self-pay | Admitting: Family Medicine

## 2022-07-08 NOTE — Telephone Encounter (Signed)
Seroquel Last filled:  04/11/22, #90 Last OV:  06/16/22, AWV Next OV:  none

## 2022-07-27 NOTE — Telephone Encounter (Signed)
Placed in Dr. G's box.  

## 2022-07-27 NOTE — Telephone Encounter (Addendum)
These are durable power of attorney forms, for financial/legal purposes, not necessarily healthcare related.  Do they have health care power of attorney form/living will/advanced directives available? If not, may need to get this set up as well. We can mail them a form or husband shoul be able to go through lawyer if he used one for durable POA.

## 2022-07-27 NOTE — Telephone Encounter (Signed)
Pt. Dropped off power of attorney forms to be put into file. Forms are in pcp's folder.

## 2022-07-30 NOTE — Telephone Encounter (Signed)
Patient's husband notified as instructed by telephone. Mr. Quintela stated that the POA is all that they have other than [place]. Patient's husband stated that his wife is not in any condition to sign other documents at this time. Mr. Shepler stated that his attorney felt that the POA would be sufficient. Mr. Diesing stated that he will reach back out to his attorney and see what she recommends.

## 2022-08-03 ENCOUNTER — Encounter: Payer: Self-pay | Admitting: Oncology

## 2022-08-03 ENCOUNTER — Inpatient Hospital Stay: Payer: Medicare Other | Attending: Oncology

## 2022-08-03 ENCOUNTER — Inpatient Hospital Stay: Payer: Medicare Other | Admitting: Oncology

## 2022-08-03 VITALS — BP 122/67 | HR 70 | Temp 97.4°F | Resp 18 | Wt 120.2 lb

## 2022-08-03 DIAGNOSIS — Z885 Allergy status to narcotic agent status: Secondary | ICD-10-CM | POA: Diagnosis not present

## 2022-08-03 DIAGNOSIS — Z7901 Long term (current) use of anticoagulants: Secondary | ICD-10-CM | POA: Insufficient documentation

## 2022-08-03 DIAGNOSIS — F0390 Unspecified dementia without behavioral disturbance: Secondary | ICD-10-CM | POA: Insufficient documentation

## 2022-08-03 DIAGNOSIS — Z880 Allergy status to penicillin: Secondary | ICD-10-CM | POA: Diagnosis not present

## 2022-08-03 DIAGNOSIS — Z833 Family history of diabetes mellitus: Secondary | ICD-10-CM | POA: Insufficient documentation

## 2022-08-03 DIAGNOSIS — I82402 Acute embolism and thrombosis of unspecified deep veins of left lower extremity: Secondary | ICD-10-CM

## 2022-08-03 DIAGNOSIS — Z823 Family history of stroke: Secondary | ICD-10-CM | POA: Diagnosis not present

## 2022-08-03 DIAGNOSIS — Z808 Family history of malignant neoplasm of other organs or systems: Secondary | ICD-10-CM | POA: Insufficient documentation

## 2022-08-03 DIAGNOSIS — R296 Repeated falls: Secondary | ICD-10-CM

## 2022-08-03 DIAGNOSIS — E785 Hyperlipidemia, unspecified: Secondary | ICD-10-CM | POA: Insufficient documentation

## 2022-08-03 DIAGNOSIS — Z888 Allergy status to other drugs, medicaments and biological substances status: Secondary | ICD-10-CM | POA: Diagnosis not present

## 2022-08-03 DIAGNOSIS — Z8249 Family history of ischemic heart disease and other diseases of the circulatory system: Secondary | ICD-10-CM | POA: Diagnosis not present

## 2022-08-03 DIAGNOSIS — Z8052 Family history of malignant neoplasm of bladder: Secondary | ICD-10-CM | POA: Insufficient documentation

## 2022-08-03 DIAGNOSIS — Z801 Family history of malignant neoplasm of trachea, bronchus and lung: Secondary | ICD-10-CM | POA: Diagnosis not present

## 2022-08-03 DIAGNOSIS — Z86718 Personal history of other venous thrombosis and embolism: Secondary | ICD-10-CM | POA: Diagnosis not present

## 2022-08-03 DIAGNOSIS — Z811 Family history of alcohol abuse and dependence: Secondary | ICD-10-CM | POA: Insufficient documentation

## 2022-08-03 DIAGNOSIS — Z9049 Acquired absence of other specified parts of digestive tract: Secondary | ICD-10-CM | POA: Diagnosis not present

## 2022-08-03 DIAGNOSIS — Z79899 Other long term (current) drug therapy: Secondary | ICD-10-CM | POA: Diagnosis not present

## 2022-08-03 LAB — COMPREHENSIVE METABOLIC PANEL
ALT: 16 U/L (ref 0–44)
AST: 23 U/L (ref 15–41)
Albumin: 3.9 g/dL (ref 3.5–5.0)
Alkaline Phosphatase: 73 U/L (ref 38–126)
Anion gap: 4 — ABNORMAL LOW (ref 5–15)
BUN: 18 mg/dL (ref 8–23)
CO2: 30 mmol/L (ref 22–32)
Calcium: 9.1 mg/dL (ref 8.9–10.3)
Chloride: 104 mmol/L (ref 98–111)
Creatinine, Ser: 0.81 mg/dL (ref 0.44–1.00)
GFR, Estimated: >60 mL/min (ref >60)
Glucose, Bld: 93 mg/dL (ref 70–99)
Potassium: 3.8 mmol/L (ref 3.5–5.1)
Sodium: 138 mmol/L (ref 135–145)
Total Bilirubin: 0.5 mg/dL (ref 0.3–1.2)
Total Protein: 7.8 g/dL (ref 6.5–8.1)

## 2022-08-03 LAB — CBC WITH DIFFERENTIAL/PLATELET
Abs Immature Granulocytes: 0.04 10*3/uL (ref 0.00–0.07)
Basophils Absolute: 0.1 10*3/uL (ref 0.0–0.1)
Basophils Relative: 1 %
Eosinophils Absolute: 0.1 10*3/uL (ref 0.0–0.5)
Eosinophils Relative: 2 %
HCT: 37 % (ref 36.0–46.0)
Hemoglobin: 11.5 g/dL — ABNORMAL LOW (ref 12.0–15.0)
Immature Granulocytes: 0 %
Lymphocytes Relative: 29 %
Lymphs Abs: 2.6 10*3/uL (ref 0.7–4.0)
MCH: 28.5 pg (ref 26.0–34.0)
MCHC: 31.1 g/dL (ref 30.0–36.0)
MCV: 91.6 fL (ref 80.0–100.0)
Monocytes Absolute: 0.9 10*3/uL (ref 0.1–1.0)
Monocytes Relative: 10 %
Neutro Abs: 5.2 10*3/uL (ref 1.7–7.7)
Neutrophils Relative %: 58 %
Platelets: 373 10*3/uL (ref 150–400)
RBC: 4.04 MIL/uL (ref 3.87–5.11)
RDW: 13.3 % (ref 11.5–15.5)
WBC: 8.9 10*3/uL (ref 4.0–10.5)
nRBC: 0 % (ref 0.0–0.2)

## 2022-08-03 MED ORDER — APIXABAN 2.5 MG PO TABS: 2.5000 mg | ORAL_TABLET | Freq: Two times a day (BID) | ORAL | 5 refills | Status: DC

## 2022-08-03 NOTE — Progress Notes (Signed)
Patient here for follow up. No new hematology concerns
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
## 2022-08-26 ENCOUNTER — Emergency Department (HOSPITAL_COMMUNITY): Payer: Medicare Other

## 2022-08-26 ENCOUNTER — Emergency Department (HOSPITAL_COMMUNITY)
Admission: EM | Admit: 2022-08-26 | Discharge: 2022-08-26 | Disposition: A | Discharge status: Home / Self Care | Payer: Medicare Other | Attending: Emergency Medicine | Admitting: Emergency Medicine

## 2022-08-26 DIAGNOSIS — Z79899 Other long term (current) drug therapy: Secondary | ICD-10-CM | POA: Diagnosis not present

## 2022-08-26 DIAGNOSIS — E860 Dehydration: Secondary | ICD-10-CM | POA: Diagnosis not present

## 2022-08-26 DIAGNOSIS — S5002XA Contusion of left elbow, initial encounter: Secondary | ICD-10-CM | POA: Insufficient documentation

## 2022-08-26 DIAGNOSIS — R0981 Nasal congestion: Secondary | ICD-10-CM | POA: Insufficient documentation

## 2022-08-26 DIAGNOSIS — S6992XA Unspecified injury of left wrist, hand and finger(s), initial encounter: Secondary | ICD-10-CM

## 2022-08-26 DIAGNOSIS — M7989 Other specified soft tissue disorders: Secondary | ICD-10-CM | POA: Insufficient documentation

## 2022-08-26 DIAGNOSIS — F0390 Unspecified dementia without behavioral disturbance: Secondary | ICD-10-CM | POA: Insufficient documentation

## 2022-08-26 DIAGNOSIS — W19XXXA Unspecified fall, initial encounter: Secondary | ICD-10-CM | POA: Diagnosis not present

## 2022-08-26 DIAGNOSIS — Z7901 Long term (current) use of anticoagulants: Secondary | ICD-10-CM | POA: Insufficient documentation

## 2022-08-26 DIAGNOSIS — Z20822 Contact with and (suspected) exposure to covid-19: Secondary | ICD-10-CM | POA: Insufficient documentation

## 2022-08-26 DIAGNOSIS — R059 Cough, unspecified: Secondary | ICD-10-CM | POA: Insufficient documentation

## 2022-08-26 DIAGNOSIS — S0990XA Unspecified injury of head, initial encounter: Secondary | ICD-10-CM | POA: Insufficient documentation

## 2022-08-26 DIAGNOSIS — S52502A Unspecified fracture of the lower end of left radius, initial encounter for closed fracture: Secondary | ICD-10-CM | POA: Diagnosis not present

## 2022-08-26 LAB — I-STAT CHEM 8, ED
BUN: 17 mg/dL (ref 8–23)
Calcium, Ion: 1.07 mmol/L — ABNORMAL LOW (ref 1.15–1.40)
Chloride: 107 mmol/L (ref 98–111)
Creatinine, Ser: 0.7 mg/dL (ref 0.44–1.00)
Glucose, Bld: 96 mg/dL (ref 70–99)
HCT: 37 % (ref 36.0–46.0)
Hemoglobin: 12.6 g/dL (ref 12.0–15.0)
Potassium: 3.7 mmol/L (ref 3.5–5.1)
Sodium: 141 mmol/L (ref 135–145)
TCO2: 24 mmol/L (ref 22–32)

## 2022-08-26 LAB — SAMPLE TO BLOOD BANK

## 2022-08-26 LAB — RESP PANEL BY RT-PCR (FLU A&B, COVID) ARPGX2
Influenza A by PCR: NEGATIVE
Influenza B by PCR: NEGATIVE
SARS Coronavirus 2 by RT PCR: NEGATIVE

## 2022-08-26 LAB — CBC
HCT: 37.7 % (ref 36.0–46.0)
Hemoglobin: 11.8 g/dL — ABNORMAL LOW (ref 12.0–15.0)
MCH: 29.1 pg (ref 26.0–34.0)
MCHC: 31.3 g/dL (ref 30.0–36.0)
MCV: 93.1 fL (ref 80.0–100.0)
Platelets: 297 10*3/uL (ref 150–400)
RBC: 4.05 MIL/uL (ref 3.87–5.11)
RDW: 13.6 % (ref 11.5–15.5)
WBC: 10.6 10*3/uL — ABNORMAL HIGH (ref 4.0–10.5)
nRBC: 0 % (ref 0.0–0.2)

## 2022-08-26 LAB — COMPREHENSIVE METABOLIC PANEL
ALT: 18 U/L (ref 0–44)
AST: 23 U/L (ref 15–41)
Albumin: 3.7 g/dL (ref 3.5–5.0)
Alkaline Phosphatase: 70 U/L (ref 38–126)
Anion gap: 8 (ref 5–15)
BUN: 15 mg/dL (ref 8–23)
CO2: 23 mmol/L (ref 22–32)
Calcium: 9.2 mg/dL (ref 8.9–10.3)
Chloride: 109 mmol/L (ref 98–111)
Creatinine, Ser: 0.73 mg/dL (ref 0.44–1.00)
GFR, Estimated: >60 mL/min (ref >60)
Glucose, Bld: 97 mg/dL (ref 70–99)
Potassium: 3.7 mmol/L (ref 3.5–5.1)
Sodium: 140 mmol/L (ref 135–145)
Total Bilirubin: 0.4 mg/dL (ref 0.3–1.2)
Total Protein: 7.6 g/dL (ref 6.5–8.1)

## 2022-08-26 LAB — ETHANOL: Alcohol, Ethyl (B): <10 mg/dL (ref <10)

## 2022-08-26 LAB — LACTIC ACID, PLASMA: Lactic Acid, Venous: 1.4 mmol/L (ref 0.5–1.9)

## 2022-08-26 MED ORDER — SODIUM CHLORIDE 0.9 % IV SOLN: INTRAVENOUS | Status: DC

## 2022-08-26 MED ORDER — SODIUM CHLORIDE 0.9 % IV BOLUS
500.0000 mL | Freq: Once | INTRAVENOUS | Status: AC
  Administered 2022-08-26: 500 mL via INTRAVENOUS

## 2022-08-26 NOTE — Progress Notes (Signed)
Provided support to pt fall on thinners.  Chaplain available as needed.  Jaclynn Major, [place], [facility], Pager [phone]

## 2022-08-26 NOTE — ED Triage Notes (Signed)
Pt's husband called EMS for a fall that occurred last night. Pt awoke with more confusion then basline, Baseline is dementia. Per husband, "She went stiff and then fell, I don't know if she hit her head or lost consciousness." Left wrist swollen with bruising and left elbow.  BP 118/76 109 cbg Hr 84 97 ra 14 rr + eliquis

## 2022-08-26 NOTE — ED Provider Notes (Addendum)
[facility] EMERGENCY DEPARTMENT Provider Note   CSN: [id number] Arrival date & time: 08/26/22  1300     History  Chief Complaint  Patient presents with   Kimberly Davies is a 81 y.o. female.  Patient brought in by EMS.  Patient with a witnessed fall by her husband at 2130 last evening she fell on the carpet.  Did not strike her head on anything on the way down.  Patient's had a history of dementia since 2017.  This morning he noticed swelling to her left wrist area.  Also has felt that she has been a little lethargic and her gait has been off a little bit worse than usual.  He is concerned that she may be dehydrated as well.  She had a little bit of cough and congestion that he noticed yesterday COVID test are at home and it was negative.  Patient is on the blood thinner Eliquis.  Past medical history sniffing for hyperlipidemia history of deep vein thrombosis in 2017 history of rectal bleeding in 2017 also the history of dementia.  Past surgical history significant for appendectomy.       Home Medications Prior to Admission medications   Medication Sig Start Date End Date Taking? Authorizing Provider  apixaban (ELIQUIS) 2.5 MG TABS tablet Take 1 tablet (2.5 mg total) by mouth 2 (two) times daily. 08/03/22  Yes Earlie Server, MD  Cholecalciferol (VITAMIN D3) 1.25 MG (50000 UT) TABS Take 1 tablet by mouth once a week. Patient taking differently: Take 1 tablet by mouth every Wednesday. 05/19/22  Yes Ria Bush, MD  denosumab (PROLIA) 60 MG/ML SOSY injection Inject 60 mg into the skin every 6 (six) months. 10/02/21  Yes Ria Bush, MD  memantine (NAMENDA) 10 MG tablet TAKE 1 TABLET BY MOUTH TWICE A DAY Patient taking differently: Take 10 mg by mouth 2 (two) times daily. 07/05/22  Yes Ria Bush, MD  QUEtiapine (SEROQUEL) 25 MG tablet TAKE 1 TABLET BY MOUTH EVERYDAY AT BEDTIME Patient taking differently: Take 25 mg by mouth at bedtime. 07/12/22  Yes  Ria Bush, MD  sertraline (ZOLOFT) 50 MG tablet TAKE 1 TABLET BY MOUTH EVERY DAY Patient taking differently: Take 50 mg by mouth daily. 06/10/22  Yes Ria Bush, MD  vitamin B-12 (CYANOCOBALAMIN) 1000 MCG tablet Take 1 tablet (1,000 mcg total) by mouth every Monday, Wednesday, and Friday. 04/01/21  Yes Ria Bush, MD      Allergies    Aricept [donepezil], Codeine, and Penicillins    Review of Systems   Review of Systems  Unable to perform ROS: Dementia    Physical Exam Updated Vital Signs BP (!) 106/52   Pulse 76   Temp 98.6 F (37 C) (Oral)   Resp 18   Ht 1.575 m ('5\' 2"'$ )   Wt 53 kg   SpO2 100%   BMI 21.37 kg/m  Physical Exam Vitals and nursing note reviewed.  Constitutional:      General: She is not in acute distress.    Appearance: Normal appearance. She is well-developed.  HENT:     Head: Normocephalic and atraumatic.     Mouth/Throat:     Mouth: Mucous membranes are dry.  Eyes:     Extraocular Movements: Extraocular movements intact.     Conjunctiva/sclera: Conjunctivae normal.     Pupils: Pupils are equal, round, and reactive to light.  Neck:     Comments: After collar removal with negative cervical CT Cardiovascular:  Rate and Rhythm: Normal rate and regular rhythm.     Heart sounds: No murmur heard. Pulmonary:     Effort: Pulmonary effort is normal. No respiratory distress.     Breath sounds: Normal breath sounds.  Abdominal:     Palpations: Abdomen is soft.     Tenderness: There is no abdominal tenderness.  Musculoskeletal:        General: Swelling and tenderness present.     Cervical back: Normal range of motion and neck supple. No rigidity or tenderness.     Comments: Swelling and tenderness to the left wrist area.  Good radial pulse 2+.  Good movement of fingers.  Little bit of bruising to the left elbow area.  But no real pain with movement.  Skin:    General: Skin is warm and dry.     Capillary Refill: Capillary refill takes  less than 2 seconds.  Neurological:     General: No focal deficit present.     Mental Status: She is alert.     Cranial Nerves: No cranial nerve deficit.     Sensory: No sensory deficit.     Motor: No weakness.     Comments: Patient not ambulated.  Psychiatric:        Mood and Affect: Mood normal.     ED Results / Procedures / Treatments   Labs (all labs ordered are listed, but only abnormal results are displayed) Labs Reviewed  CBC - Abnormal; Notable for the following components:      Result Value   WBC 10.6 (*)    Hemoglobin 11.8 (*)    All other components within normal limits  I-STAT CHEM 8, ED - Abnormal; Notable for the following components:   Calcium, Ion 1.07 (*)    All other components within normal limits  RESP PANEL BY RT-PCR (FLU A&B, COVID) ARPGX2  COMPREHENSIVE METABOLIC PANEL  ETHANOL  LACTIC ACID, PLASMA  URINALYSIS, ROUTINE W REFLEX MICROSCOPIC  PROTIME-INR  APTT  SAMPLE TO BLOOD BANK    EKG None  Radiology DG Wrist Complete Left  Result Date: 08/26/2022 CLINICAL DATA:  Blunt trauma. Fell last night 08/25/2022. Pain in left wrist. Swelling of left hand. EXAM: LEFT WRIST - COMPLETE 3+ VIEW COMPARISON:  Left forearm radiographs 01/03/2022 FINDINGS: Severe thumb carpometacarpal joint space narrowing with bone-on-bone contact, subchondral sclerosis, and peripheral osteophytosis. Severe triscaphe joint space narrowing. Small chronic ossicle at the lateral aspect of the trapezium. Moderate to severe thumb interphalangeal and moderate thumb metacarpophalangeal joint space narrowing. There is mild angulation of the far medial aspect of the distal radial metaphyseal cortex, and there is a linear lucency at the medial aspect of the distal radial articular surface on the final (oblique) provided image. Findings are suspicious for an acute fracture. There also appears to be mild cortical angulation at the dorsal aspect of the distal radial diaphysis on lateral view.  There is high-grade regional soft tissue swelling. IMPRESSION: Acute intra-articular fracture of the distal radius, favored to extend from the medial metaphysis through the distal articular surface near the lunate. No significant displacement. Electronically Signed   By: Yvonne Kendall M.D.   On: 08/26/2022 14:23   CT HEAD WO CONTRAST  Result Date: 08/26/2022 CLINICAL DATA:  Fall.  Poly trauma. EXAM: CT HEAD WITHOUT CONTRAST CT CERVICAL SPINE WITHOUT CONTRAST TECHNIQUE: Multidetector CT imaging of the head and cervical spine was performed following the standard protocol without intravenous contrast. Multiplanar CT image reconstructions of the cervical spine were also  generated. RADIATION DOSE REDUCTION: This exam was performed according to the departmental dose-optimization program which includes automated exposure control, adjustment of the mA and/or kV according to patient size and/or use of iterative reconstruction technique. COMPARISON:  CT head 05/22/2022 FINDINGS: CT HEAD FINDINGS Brain: Generalized atrophy. Negative for hydrocephalus. Mild white matter hypodensity bilaterally. Negative for acute infarct, hemorrhage, mass Vascular: Negative for hyperdense vessel Skull: Negative Sinuses/Orbits: Mild mucosal edema paranasal sinuses. Negative orbit Other: None CT CERVICAL SPINE FINDINGS Alignment: Normal Skull base and vertebrae: Negative for fracture Soft tissues and spinal canal: Negative for mass or edema in the neck. Disc levels: Mild disc degeneration and spurring in the cervical spine. Negative for stenosis. Upper chest: Mild apical scarring bilaterally. No acute abnormality. Other: None IMPRESSION: Atrophy and chronic microvascular ischemia. No acute intracranial abnormality Negative for cervical spine fracture Electronically Signed   By: Franchot Gallo M.D.   On: 08/26/2022 13:42   CT CERVICAL SPINE WO CONTRAST  Result Date: 08/26/2022 CLINICAL DATA:  Fall.  Poly trauma. EXAM: CT HEAD WITHOUT  CONTRAST CT CERVICAL SPINE WITHOUT CONTRAST TECHNIQUE: Multidetector CT imaging of the head and cervical spine was performed following the standard protocol without intravenous contrast. Multiplanar CT image reconstructions of the cervical spine were also generated. RADIATION DOSE REDUCTION: This exam was performed according to the departmental dose-optimization program which includes automated exposure control, adjustment of the mA and/or kV according to patient size and/or use of iterative reconstruction technique. COMPARISON:  CT head 05/22/2022 FINDINGS: CT HEAD FINDINGS Brain: Generalized atrophy. Negative for hydrocephalus. Mild white matter hypodensity bilaterally. Negative for acute infarct, hemorrhage, mass Vascular: Negative for hyperdense vessel Skull: Negative Sinuses/Orbits: Mild mucosal edema paranasal sinuses. Negative orbit Other: None CT CERVICAL SPINE FINDINGS Alignment: Normal Skull base and vertebrae: Negative for fracture Soft tissues and spinal canal: Negative for mass or edema in the neck. Disc levels: Mild disc degeneration and spurring in the cervical spine. Negative for stenosis. Upper chest: Mild apical scarring bilaterally. No acute abnormality. Other: None IMPRESSION: Atrophy and chronic microvascular ischemia. No acute intracranial abnormality Negative for cervical spine fracture Electronically Signed   By: Franchot Gallo M.D.   On: 08/26/2022 13:42   DG Pelvis Portable  Result Date: 08/26/2022 CLINICAL DATA:  Fall.  Level 2 trauma. EXAM: PORTABLE PELVIS 1-2 VIEWS COMPARISON:  CT of the abdomen and pelvis 01/14/2019 FINDINGS: There is no evidence of pelvic fracture or diastasis. No pelvic bone lesions are seen. IMPRESSION: Negative. Electronically Signed   By: San Morelle M.D.   On: 08/26/2022 13:28   DG Chest Port 1 View  Result Date: 08/26/2022 CLINICAL DATA:  Trauma.  Fall. EXAM: PORTABLE CHEST 1 VIEW COMPARISON:  Chest and right rib radiographs 06/12/2021 FINDINGS:  Heart size is normal. Atherosclerotic changes are present at the aortic arch. Chronic changes of COPD are stable. No superimposed edema or effusion is present. No acute fractures or pneumothorax is present. Mild curvature in the thoracolumbar spine is stable. IMPRESSION: 1. Stable chronic changes of COPD. 2. No acute cardiopulmonary disease. Electronically Signed   By: San Morelle M.D.   On: 08/26/2022 13:27    Procedures Procedures    Medications Ordered in ED Medications  0.9 %  sodium chloride infusion ( Intravenous New Bag/Given 08/26/22 1459)  sodium chloride 0.9 % bolus 500 mL (500 mLs Intravenous New Bag/Given 08/26/22 1440)    ED Course/ Medical Decision Making/ A&P  Medical Decision Making Amount and/or Complexity of Data Reviewed Labs: ordered. Radiology: ordered. ECG/medicine tests: ordered.  Risk Prescription drug management.   Patient alert.  Patient's mental status currently baseline according to husband.  Just mostly worried about the gait.  Patient's head CT CT neck negative cervical collar removed.  X-ray of pelvis without any bony abnormalities.  Chest x-ray without any acute findings stable COPD.  X-ray of the left wrist area is pending.  Labs pending.  Except for CBC with a white count of 10.6 hemoglobin 11.8 i-STAT without any electrolyte abnormalities.  Complete metabolic panel pending.  Urinalysis pending.  Patient's blood pressures here in the 90s.  No fever not tachycardic no septic parameters on vital signs.  We will go ahead and hydrate since mucous membranes a little dry we will give 500 cc normal saline bolus and give maintenance fluids.  Patient's blood pressures remaining in the mid 90s.  Patient alert not tachycardic not concerned about infection.  X-ray of the left wrist shows distal radius fracture without significant displacement.  Will place in sugar-tong splint and have her follow-up with orthopedics.  Patient's  husband used an orthopedic doctor in the past but he cannot remember the group's name.  We will also give him Dr. Dierdre Highman number who is on-call today for unassigned.  They will keep the fiberglass splint in place she is got a sling that she can wear as well.  He is not concerned about her balance being off says that that is been going on for a while so does not want MRI head to rule out stroke.  We do not have a urinalysis back yet but he is not concerned about urinary tract infection.   Final Clinical Impression(s) / ED Diagnoses Final diagnoses:  Fall, initial encounter  Wrist injury, left, initial encounter  Dehydration  Closed fracture of distal end of left radius, unspecified fracture morphology, initial encounter    Rx / DC Orders ED Discharge Orders     None         Fredia Sorrow, MD 08/26/22 1420    Fredia Sorrow, MD 08/26/22 1545

## 2022-08-26 NOTE — Discharge Instructions (Addendum)
Work-up for the fall positive for left wrist fracture.  Keep the fiberglass splint in place and dry.  Sling as needed for comfort.  Call and follow-up with either your orthopedic doctor or Dr. Dierdre Highman who is on-call for unassigned today.  Return for any new or worse symptoms.  Otherwise no significant injuries related to the fall.

## 2022-08-26 NOTE — ED Notes (Signed)
Pt transported to CT with primary RN

## 2022-08-26 NOTE — Progress Notes (Signed)
Orthopedic Tech Progress Note Patient Details:  Kimberly Davies [birth year] [id number]  Ortho Devices Type of Ortho Device: Sugartong splint, Shoulder immobilizer Ortho Device/Splint Location: LUE Ortho Device/Splint Interventions: Ordered, Application, Adjustment   Post Interventions Patient Tolerated: Well Instructions Provided: Adjustment of device, Care of device  Arville Go 08/26/2022, 5:09 PM

## 2022-08-26 NOTE — ED Notes (Signed)
Ortho tech at bedside 

## 2022-08-27 ENCOUNTER — Emergency Department (HOSPITAL_COMMUNITY)
Admission: EM | Admit: 2022-08-27 | Discharge: 2022-08-27 | Disposition: A | Discharge status: Home / Self Care | Payer: Medicare Other | Attending: Emergency Medicine | Admitting: Emergency Medicine

## 2022-08-27 ENCOUNTER — Emergency Department (HOSPITAL_COMMUNITY): Payer: Medicare Other

## 2022-08-27 ENCOUNTER — Ambulatory Visit: Payer: Medicare Other | Admitting: Family Medicine

## 2022-08-27 ENCOUNTER — Encounter (HOSPITAL_COMMUNITY): Payer: Self-pay

## 2022-08-27 ENCOUNTER — Other Ambulatory Visit: Payer: Self-pay | Admitting: Family Medicine

## 2022-08-27 DIAGNOSIS — W010XXD Fall on same level from slipping, tripping and stumbling without subsequent striking against object, subsequent encounter: Secondary | ICD-10-CM | POA: Insufficient documentation

## 2022-08-27 DIAGNOSIS — R269 Unspecified abnormalities of gait and mobility: Secondary | ICD-10-CM | POA: Diagnosis not present

## 2022-08-27 DIAGNOSIS — F0390 Unspecified dementia without behavioral disturbance: Secondary | ICD-10-CM | POA: Diagnosis not present

## 2022-08-27 DIAGNOSIS — Z7901 Long term (current) use of anticoagulants: Secondary | ICD-10-CM | POA: Diagnosis not present

## 2022-08-27 DIAGNOSIS — R531 Weakness: Secondary | ICD-10-CM | POA: Diagnosis present

## 2022-08-27 DIAGNOSIS — R2681 Unsteadiness on feet: Secondary | ICD-10-CM

## 2022-08-27 LAB — URINALYSIS, ROUTINE W REFLEX MICROSCOPIC
Bilirubin Urine: NEGATIVE
Glucose, UA: NEGATIVE mg/dL
Ketones, ur: 5 mg/dL — AB
Leukocytes,Ua: NEGATIVE
Nitrite: NEGATIVE
Protein, ur: NEGATIVE mg/dL
Specific Gravity, Urine: 1.024 (ref 1.005–1.030)
pH: 5 (ref 5.0–8.0)

## 2022-08-27 LAB — CBC
HCT: 35.4 % — ABNORMAL LOW (ref 36.0–46.0)
Hemoglobin: 10.9 g/dL — ABNORMAL LOW (ref 12.0–15.0)
MCH: 28.6 pg (ref 26.0–34.0)
MCHC: 30.8 g/dL (ref 30.0–36.0)
MCV: 92.9 fL (ref 80.0–100.0)
Platelets: 307 10*3/uL (ref 150–400)
RBC: 3.81 MIL/uL — ABNORMAL LOW (ref 3.87–5.11)
RDW: 13.8 % (ref 11.5–15.5)
WBC: 8.6 10*3/uL (ref 4.0–10.5)
nRBC: 0 % (ref 0.0–0.2)

## 2022-08-27 LAB — BASIC METABOLIC PANEL
Anion gap: 6 (ref 5–15)
BUN: 14 mg/dL (ref 8–23)
CO2: 27 mmol/L (ref 22–32)
Calcium: 9 mg/dL (ref 8.9–10.3)
Chloride: 106 mmol/L (ref 98–111)
Creatinine, Ser: 0.79 mg/dL (ref 0.44–1.00)
GFR, Estimated: >60 mL/min (ref >60)
Glucose, Bld: 92 mg/dL (ref 70–99)
Potassium: 3.7 mmol/L (ref 3.5–5.1)
Sodium: 139 mmol/L (ref 135–145)

## 2022-08-27 MED ORDER — SODIUM CHLORIDE 0.9 % IV BOLUS
1000.0000 mL | Freq: Once | INTRAVENOUS | Status: AC
  Administered 2022-08-27: 1000 mL via INTRAVENOUS

## 2022-08-27 MED ORDER — ACETAMINOPHEN 325 MG PO TABS
650.0000 mg | ORAL_TABLET | Freq: Once | ORAL | Status: AC
  Administered 2022-08-27: 650 mg via ORAL
  Filled 2022-08-27: qty 2

## 2022-08-27 NOTE — Progress Notes (Signed)
CSW spoke with patients husband who stated he is not at the point of memory care placement but would like some help at home. Patients husband stated his son mentioned memory care placement but he stated he is going to try to hold off as long as possible. CSW told patients husband she can provide him with resources for a home aide so he can get some relief in the home. CSW also asked the husband if he was interested in home health. Patients husband stated yes and that they had home health a few years ago. Memory care and home aide resources are attached to patients AVS.    CSW spoke with Malachy Mood with [facility] home health who can take patient for PT.

## 2022-08-27 NOTE — ED Triage Notes (Signed)
PT's husband called EMS for increased lethargy, pt was d/c from ED yesterday for fall. PT has a hx of dementia.

## 2022-08-27 NOTE — ED Provider Notes (Signed)
[facility] EMERGENCY DEPARTMENT Provider Note   CSN: [id number] Arrival date & time: 08/27/22  3202     History  Chief Complaint  Patient presents with   Hypotension    SONYA GUNNOE is a 81 y.o. female.  Pt with hx dementia, presents after being noted to be generally weak today by spouse. Pt limited historian - level 5 caveat. Pt s/p fall yesterday - seen in ED then, dx with left wrist fracture. Head imaging negative then, and labs largely c/w baseline. Pt denies any new c/o this AM, is awake and alert. No headache. No chest pain. No sob. No abd pain or nv. No reported fevers. No reported repeat  fall.   The history is provided by the patient, the spouse, medical records and the EMS personnel. The history is limited by the condition of the patient.       Home Medications Prior to Admission medications   Medication Sig Start Date End Date Taking? Authorizing Provider  apixaban (ELIQUIS) 2.5 MG TABS tablet Take 1 tablet (2.5 mg total) by mouth 2 (two) times daily. 08/03/22   Earlie Server, MD  Cholecalciferol (VITAMIN D3) 1.25 MG (50000 UT) TABS Take 1 tablet by mouth once a week. Patient taking differently: Take 1 tablet by mouth every Wednesday. 05/19/22   Ria Bush, MD  denosumab (PROLIA) 60 MG/ML SOSY injection Inject 60 mg into the skin every 6 (six) months. 10/02/21   Ria Bush, MD  memantine (NAMENDA) 10 MG tablet TAKE 1 TABLET BY MOUTH TWICE A DAY Patient taking differently: Take 10 mg by mouth 2 (two) times daily. 07/05/22   Ria Bush, MD  QUEtiapine (SEROQUEL) 25 MG tablet TAKE 1 TABLET BY MOUTH EVERYDAY AT BEDTIME Patient taking differently: Take 25 mg by mouth at bedtime. 07/12/22   Ria Bush, MD  sertraline (ZOLOFT) 50 MG tablet TAKE 1 TABLET BY MOUTH EVERY DAY Patient taking differently: Take 50 mg by mouth daily. 06/10/22   Ria Bush, MD  vitamin B-12 (CYANOCOBALAMIN) 1000 MCG tablet Take 1 tablet (1,000 mcg total) by  mouth every Monday, Wednesday, and Friday. 04/01/21   Ria Bush, MD      Allergies    Aricept [donepezil], Codeine, and Penicillins    Review of Systems   Review of Systems  Constitutional:  Negative for fever.  Respiratory:  Negative for shortness of breath.   Cardiovascular:  Negative for chest pain.  Gastrointestinal:  Negative for abdominal pain, diarrhea and vomiting.  Genitourinary:  Negative for dysuria.  Musculoskeletal:  Negative for back pain and neck pain.  Neurological:  Negative for headaches.  Psychiatric/Behavioral:  Positive for confusion.     Physical Exam Updated Vital Signs BP (!) 102/57 (BP Location: Right Arm)   Pulse 80   Temp 98.2 F (36.8 C) (Oral)   Resp 19   SpO2 97%  Physical Exam Vitals and nursing note reviewed.  Constitutional:      Appearance: Normal appearance. She is well-developed.  HENT:     Head: Atraumatic.     Nose: Nose normal.     Mouth/Throat:     Mouth: Mucous membranes are moist.  Eyes:     General: No scleral icterus.    Conjunctiva/sclera: Conjunctivae normal.     Pupils: Pupils are equal, round, and reactive to light.  Neck:     Trachea: No tracheal deviation.     Comments: No stiffness or rigidity.  Cardiovascular:     Rate and Rhythm: Normal rate  and regular rhythm.     Pulses: Normal pulses.     Heart sounds: Normal heart sounds. No murmur heard.    No friction rub. No gallop.  Pulmonary:     Effort: Pulmonary effort is normal. No respiratory distress.     Breath sounds: Normal breath sounds.  Abdominal:     General: Bowel sounds are normal. There is no distension.     Palpations: Abdomen is soft. There is no mass.     Tenderness: There is no abdominal tenderness.  Genitourinary:    Comments: No cva tenderness.  Musculoskeletal:        General: No swelling.     Cervical back: Normal range of motion and neck supple. No rigidity. No muscular tenderness.     Comments: CTLS spine, non tender, aligned, no  step off. Left wrist in splint. Normal cap refill distally in fingers. No other focal bony tenderness noted on bil extremity exam. Distal pulses palp bil.   Skin:    General: Skin is warm and dry.     Findings: No rash.  Neurological:     Mental Status: She is alert.     Comments: Alert, speech normal. Motor/sens grossly intact bil.   Psychiatric:        Mood and Affect: Mood normal.     ED Results / Procedures / Treatments   Labs (all labs ordered are listed, but only abnormal results are displayed) Results for orders placed or performed during the hospital encounter of 08/27/22  CBC  Result Value Ref Range   WBC 8.6 4.0 - 10.5 K/uL   RBC 3.81 (L) 3.87 - 5.11 MIL/uL   Hemoglobin 10.9 (L) 12.0 - 15.0 g/dL   HCT 35.4 (L) 36.0 - 46.0 %   MCV 92.9 80.0 - 100.0 fL   MCH 28.6 26.0 - 34.0 pg   MCHC 30.8 30.0 - 36.0 g/dL   RDW 13.8 11.5 - 15.5 %   Platelets 307 150 - 400 K/uL   nRBC 0.0 0.0 - 0.2 %  Basic metabolic panel  Result Value Ref Range   Sodium 139 135 - 145 mmol/L   Potassium 3.7 3.5 - 5.1 mmol/L   Chloride 106 98 - 111 mmol/L   CO2 27 22 - 32 mmol/L   Glucose, Bld 92 70 - 99 mg/dL   BUN 14 8 - 23 mg/dL   Creatinine, Ser 0.79 0.44 - 1.00 mg/dL   Calcium 9.0 8.9 - 10.3 mg/dL   GFR, Estimated >60 >60 mL/min   Anion gap 6 5 - 15  Urinalysis, Routine w reflex microscopic Urine, In & Out Cath  Result Value Ref Range   Color, Urine YELLOW YELLOW   APPearance HAZY (A) CLEAR   Specific Gravity, Urine 1.024 1.005 - 1.030   pH 5.0 5.0 - 8.0   Glucose, UA NEGATIVE NEGATIVE mg/dL   Hgb urine dipstick SMALL (A) NEGATIVE   Bilirubin Urine NEGATIVE NEGATIVE   Ketones, ur 5 (A) NEGATIVE mg/dL   Protein, ur NEGATIVE NEGATIVE mg/dL   Nitrite NEGATIVE NEGATIVE   Leukocytes,Ua NEGATIVE NEGATIVE   RBC / HPF 6-10 0 - 5 RBC/hpf   WBC, UA 0-5 0 - 5 WBC/hpf   Bacteria, UA RARE (A) NONE SEEN   Squamous Epithelial / LPF 0-5 0 - 5   Mucus PRESENT    Hyaline Casts, UA PRESENT    DG  Wrist Complete Left  Result Date: 08/26/2022 CLINICAL DATA:  Blunt trauma. Fell last night 08/25/2022. Pain  in left wrist. Swelling of left hand. EXAM: LEFT WRIST - COMPLETE 3+ VIEW COMPARISON:  Left forearm radiographs 01/03/2022 FINDINGS: Severe thumb carpometacarpal joint space narrowing with bone-on-bone contact, subchondral sclerosis, and peripheral osteophytosis. Severe triscaphe joint space narrowing. Small chronic ossicle at the lateral aspect of the trapezium. Moderate to severe thumb interphalangeal and moderate thumb metacarpophalangeal joint space narrowing. There is mild angulation of the far medial aspect of the distal radial metaphyseal cortex, and there is a linear lucency at the medial aspect of the distal radial articular surface on the final (oblique) provided image. Findings are suspicious for an acute fracture. There also appears to be mild cortical angulation at the dorsal aspect of the distal radial diaphysis on lateral view. There is high-grade regional soft tissue swelling. IMPRESSION: Acute intra-articular fracture of the distal radius, favored to extend from the medial metaphysis through the distal articular surface near the lunate. No significant displacement. Electronically Signed   By: Yvonne Kendall M.D.   On: 08/26/2022 14:23   CT HEAD WO CONTRAST  Result Date: 08/26/2022 CLINICAL DATA:  Fall.  Poly trauma. EXAM: CT HEAD WITHOUT CONTRAST CT CERVICAL SPINE WITHOUT CONTRAST TECHNIQUE: Multidetector CT imaging of the head and cervical spine was performed following the standard protocol without intravenous contrast. Multiplanar CT image reconstructions of the cervical spine were also generated. RADIATION DOSE REDUCTION: This exam was performed according to the departmental dose-optimization program which includes automated exposure control, adjustment of the mA and/or kV according to patient size and/or use of iterative reconstruction technique. COMPARISON:  CT head 05/22/2022  FINDINGS: CT HEAD FINDINGS Brain: Generalized atrophy. Negative for hydrocephalus. Mild white matter hypodensity bilaterally. Negative for acute infarct, hemorrhage, mass Vascular: Negative for hyperdense vessel Skull: Negative Sinuses/Orbits: Mild mucosal edema paranasal sinuses. Negative orbit Other: None CT CERVICAL SPINE FINDINGS Alignment: Normal Skull base and vertebrae: Negative for fracture Soft tissues and spinal canal: Negative for mass or edema in the neck. Disc levels: Mild disc degeneration and spurring in the cervical spine. Negative for stenosis. Upper chest: Mild apical scarring bilaterally. No acute abnormality. Other: None IMPRESSION: Atrophy and chronic microvascular ischemia. No acute intracranial abnormality Negative for cervical spine fracture Electronically Signed   By: Franchot Gallo M.D.   On: 08/26/2022 13:42   CT CERVICAL SPINE WO CONTRAST  Result Date: 08/26/2022 CLINICAL DATA:  Fall.  Poly trauma. EXAM: CT HEAD WITHOUT CONTRAST CT CERVICAL SPINE WITHOUT CONTRAST TECHNIQUE: Multidetector CT imaging of the head and cervical spine was performed following the standard protocol without intravenous contrast. Multiplanar CT image reconstructions of the cervical spine were also generated. RADIATION DOSE REDUCTION: This exam was performed according to the departmental dose-optimization program which includes automated exposure control, adjustment of the mA and/or kV according to patient size and/or use of iterative reconstruction technique. COMPARISON:  CT head 05/22/2022 FINDINGS: CT HEAD FINDINGS Brain: Generalized atrophy. Negative for hydrocephalus. Mild white matter hypodensity bilaterally. Negative for acute infarct, hemorrhage, mass Vascular: Negative for hyperdense vessel Skull: Negative Sinuses/Orbits: Mild mucosal edema paranasal sinuses. Negative orbit Other: None CT CERVICAL SPINE FINDINGS Alignment: Normal Skull base and vertebrae: Negative for fracture Soft tissues and spinal  canal: Negative for mass or edema in the neck. Disc levels: Mild disc degeneration and spurring in the cervical spine. Negative for stenosis. Upper chest: Mild apical scarring bilaterally. No acute abnormality. Other: None IMPRESSION: Atrophy and chronic microvascular ischemia. No acute intracranial abnormality Negative for cervical spine fracture Electronically Signed   By: Jorja Loa.D.  On: 08/26/2022 13:42   DG Pelvis Portable  Result Date: 08/26/2022 CLINICAL DATA:  Fall.  Level 2 trauma. EXAM: PORTABLE PELVIS 1-2 VIEWS COMPARISON:  CT of the abdomen and pelvis 01/14/2019 FINDINGS: There is no evidence of pelvic fracture or diastasis. No pelvic bone lesions are seen. IMPRESSION: Negative. Electronically Signed   By: San Morelle M.D.   On: 08/26/2022 13:28   DG Chest Port 1 View  Result Date: 08/26/2022 CLINICAL DATA:  Trauma.  Fall. EXAM: PORTABLE CHEST 1 VIEW COMPARISON:  Chest and right rib radiographs 06/12/2021 FINDINGS: Heart size is normal. Atherosclerotic changes are present at the aortic arch. Chronic changes of COPD are stable. No superimposed edema or effusion is present. No acute fractures or pneumothorax is present. Mild curvature in the thoracolumbar spine is stable. IMPRESSION: 1. Stable chronic changes of COPD. 2. No acute cardiopulmonary disease. Electronically Signed   By: San Morelle M.D.   On: 08/26/2022 13:27      EKG None  Radiology DG Wrist Complete Left  Result Date: 08/26/2022 CLINICAL DATA:  Blunt trauma. Fell last night 08/25/2022. Pain in left wrist. Swelling of left hand. EXAM: LEFT WRIST - COMPLETE 3+ VIEW COMPARISON:  Left forearm radiographs 01/03/2022 FINDINGS: Severe thumb carpometacarpal joint space narrowing with bone-on-bone contact, subchondral sclerosis, and peripheral osteophytosis. Severe triscaphe joint space narrowing. Small chronic ossicle at the lateral aspect of the trapezium. Moderate to severe thumb interphalangeal and  moderate thumb metacarpophalangeal joint space narrowing. There is mild angulation of the far medial aspect of the distal radial metaphyseal cortex, and there is a linear lucency at the medial aspect of the distal radial articular surface on the final (oblique) provided image. Findings are suspicious for an acute fracture. There also appears to be mild cortical angulation at the dorsal aspect of the distal radial diaphysis on lateral view. There is high-grade regional soft tissue swelling. IMPRESSION: Acute intra-articular fracture of the distal radius, favored to extend from the medial metaphysis through the distal articular surface near the lunate. No significant displacement. Electronically Signed   By: Yvonne Kendall M.D.   On: 08/26/2022 14:23   CT HEAD WO CONTRAST  Result Date: 08/26/2022 CLINICAL DATA:  Fall.  Poly trauma. EXAM: CT HEAD WITHOUT CONTRAST CT CERVICAL SPINE WITHOUT CONTRAST TECHNIQUE: Multidetector CT imaging of the head and cervical spine was performed following the standard protocol without intravenous contrast. Multiplanar CT image reconstructions of the cervical spine were also generated. RADIATION DOSE REDUCTION: This exam was performed according to the departmental dose-optimization program which includes automated exposure control, adjustment of the mA and/or kV according to patient size and/or use of iterative reconstruction technique. COMPARISON:  CT head 05/22/2022 FINDINGS: CT HEAD FINDINGS Brain: Generalized atrophy. Negative for hydrocephalus. Mild white matter hypodensity bilaterally. Negative for acute infarct, hemorrhage, mass Vascular: Negative for hyperdense vessel Skull: Negative Sinuses/Orbits: Mild mucosal edema paranasal sinuses. Negative orbit Other: None CT CERVICAL SPINE FINDINGS Alignment: Normal Skull base and vertebrae: Negative for fracture Soft tissues and spinal canal: Negative for mass or edema in the neck. Disc levels: Mild disc degeneration and spurring in  the cervical spine. Negative for stenosis. Upper chest: Mild apical scarring bilaterally. No acute abnormality. Other: None IMPRESSION: Atrophy and chronic microvascular ischemia. No acute intracranial abnormality Negative for cervical spine fracture Electronically Signed   By: Franchot Gallo M.D.   On: 08/26/2022 13:42   CT CERVICAL SPINE WO CONTRAST  Result Date: 08/26/2022 CLINICAL DATA:  Fall.  Poly trauma. EXAM: CT HEAD  WITHOUT CONTRAST CT CERVICAL SPINE WITHOUT CONTRAST TECHNIQUE: Multidetector CT imaging of the head and cervical spine was performed following the standard protocol without intravenous contrast. Multiplanar CT image reconstructions of the cervical spine were also generated. RADIATION DOSE REDUCTION: This exam was performed according to the departmental dose-optimization program which includes automated exposure control, adjustment of the mA and/or kV according to patient size and/or use of iterative reconstruction technique. COMPARISON:  CT head 05/22/2022 FINDINGS: CT HEAD FINDINGS Brain: Generalized atrophy. Negative for hydrocephalus. Mild white matter hypodensity bilaterally. Negative for acute infarct, hemorrhage, mass Vascular: Negative for hyperdense vessel Skull: Negative Sinuses/Orbits: Mild mucosal edema paranasal sinuses. Negative orbit Other: None CT CERVICAL SPINE FINDINGS Alignment: Normal Skull base and vertebrae: Negative for fracture Soft tissues and spinal canal: Negative for mass or edema in the neck. Disc levels: Mild disc degeneration and spurring in the cervical spine. Negative for stenosis. Upper chest: Mild apical scarring bilaterally. No acute abnormality. Other: None IMPRESSION: Atrophy and chronic microvascular ischemia. No acute intracranial abnormality Negative for cervical spine fracture Electronically Signed   By: Franchot Gallo M.D.   On: 08/26/2022 13:42   DG Pelvis Portable  Result Date: 08/26/2022 CLINICAL DATA:  Fall.  Level 2 trauma. EXAM: PORTABLE  PELVIS 1-2 VIEWS COMPARISON:  CT of the abdomen and pelvis 01/14/2019 FINDINGS: There is no evidence of pelvic fracture or diastasis. No pelvic bone lesions are seen. IMPRESSION: Negative. Electronically Signed   By: San Morelle M.D.   On: 08/26/2022 13:28   DG Chest Port 1 View  Result Date: 08/26/2022 CLINICAL DATA:  Trauma.  Fall. EXAM: PORTABLE CHEST 1 VIEW COMPARISON:  Chest and right rib radiographs 06/12/2021 FINDINGS: Heart size is normal. Atherosclerotic changes are present at the aortic arch. Chronic changes of COPD are stable. No superimposed edema or effusion is present. No acute fractures or pneumothorax is present. Mild curvature in the thoracolumbar spine is stable. IMPRESSION: 1. Stable chronic changes of COPD. 2. No acute cardiopulmonary disease. Electronically Signed   By: San Morelle M.D.   On: 08/26/2022 13:27    Procedures Procedures    Medications Ordered in ED Medications - No data to display  ED Course/ Medical Decision Making/ A&P                           Medical Decision Making Problems Addressed: Dementia without behavioral disturbance ([place]): chronic illness or injury with exacerbation, progression, or side effects of treatment that poses a threat to life or bodily functions    Details: Acute on chronic Fall from slip, trip, or stumble, subsequent encounter: acute illness or injury with systemic symptoms that poses a threat to life or bodily functions Gait instability: chronic illness or injury with exacerbation, progression, or side effects of treatment that poses a threat to life or bodily functions Generalized weakness: acute illness or injury that poses a threat to life or bodily functions    Details: Acute on chronic Long term current use of anticoagulant therapy: chronic illness or injury that poses a threat to life or bodily functions  Amount and/or Complexity of Data Reviewed Independent Historian: spouse and EMS    Details:  hx External Data Reviewed: labs, radiology and notes. Labs: ordered. Decision-making details documented in ED Course. Radiology: ordered and independent interpretation performed. Decision-making details documented in ED Course. ECG/medicine tests: ordered.  Risk OTC drugs. Decision regarding hospitalization.   Iv ns. Continuous pulse ox and cardiac monitoring. Labs ordered/sent.  Imaging ordered.   Diff dx includes dehydration, aki, head injury, uti, etc - dispo decision including potential need for admission considered - will get labs and imaging and reassess.   Reviewed nursing notes and prior charts for additional history. External reports reviewed. Additional history from: EMS.   Cardiac monitor: sinus rhythm, rate 80.  Labs reviewed/interpreted by me - chem normal. Ua neg for uti.   Recent Xrays reviewed/interpreted by me - left wrist fx  CT reviewed/interpreted by me - no hem.   Spouse-additional hx - indicates issues w falls in past year or so, and that has seemed generally weaker/more unsteady in past couple weeks. Given dementia, patient cannot effectively use walker but he tries to assist.  States pcp/Dr Melrose Nakayama w neurology (recent eval 9/18) had discussed setting up home health services but has not started yet.  Currently pts mental status c/w baseline. Pt remains content, alert, in no apparent distress or discomfort.   Acetaminophen po.   Pt has received ivf, and po fluids/food. Vitals normal.   Toc consulted to facilitate home health services. They met w pt/spouse - they indicates spouse wants to continue to try to manage at home w [facility] services -  order placed.   Rec close pcp f/u.  Return precautions provided.            Final Clinical Impression(s) / ED Diagnoses Final diagnoses:  None    Rx / DC Orders ED Discharge Orders     None         Lajean Saver, MD 08/27/22 [phone]

## 2022-08-27 NOTE — ED Notes (Signed)
PT bladder scanned, retaining 131 ml of urine

## 2022-08-27 NOTE — Discharge Instructions (Addendum)
It was our pleasure to provide your ER care today - we hope that you feel better.  Drink plenty of fluids/stay well hydrated.   Fall precautions - use great care and assistance to help decrease risk of falling.  Given falls, discuss with your doctor whether they feel benefit of blood thinner therapy continues to outweigh risk.  Referral has been made for home health services - they should be contacting you soon. You may also want to consider [facility], [facility] or other home care agency for additional help.   In the event you wish to pursue some for of extended care facility in future, discuss with your primary care doctors office, and begin to pursue possible options.  Return to ER if worse, new symptoms, fevers, chest pain, trouble breathing, or other concern.   Private Pay Resources  [facility] Address: [address], [place], [place] [address] Phone: [phone]  [facility] Address: [address], [place], [place] [address] Phone: [phone]  [facility] Address: [address] [place], [place] [address] Phone: [phone]  [facility] Address: [address] [place], [place] [address] Phone: [phone]  [facility] Address: [address] [place], [place], [place] [address] Phone: [phone]  [facility] Phone: [phone]  [facility] Address:  [address], [place], [place] [address] Phone:  [phone]  [facility] Address:  [address] Phone:  [phone]  [email or web address]  [facility] [place] Phone: [phone]    [facility] consulted regarding [place] and Agencies.  RNCM provided the following list for family.  Memory Care Facilities in [place], [place]  Also serving communities of [place].  [place] at [facility] [address], [place], [place] [address] [place] of [place]  [place], [place], [place] [address] [phone]  [facility] [address] [place], [place] [address] [phone]  [facility] [address] [place], [place] [address] [place] [address] [place], [place] [address] [phone]  [place] and [place] [address] [place], [place] [address] [phone]  [facility] [place], [place], [place] [address] [place] [address], [place], [place] [address] [place] at [facility] and [facility] [address], [place], [place] [address] [phone]  [facility] [place], [place] [address], [place], [place] [address] [phone] of [place] [address], [place], [place] [address] [place] [address], [place], [place] [address] [phone]  The average cost of memory care in [place] is [phone] per month. This is higher than the national median of $5,395. Cheaper nearby regions include [place] with an average starting cost of $5,067.  [email or web address] has helped many in [place] find high-quality senior care. To speak with one of our Family Advisors about memory care options and costs in [place], call [phone].

## 2022-08-30 ENCOUNTER — Ambulatory Visit (INDEPENDENT_AMBULATORY_CARE_PROVIDER_SITE_OTHER): Payer: Medicare Other | Admitting: Family Medicine

## 2022-08-30 ENCOUNTER — Encounter: Payer: Self-pay | Admitting: Family Medicine

## 2022-08-30 VITALS — BP 110/60 | HR 80 | Temp 98.3°F | Ht 62.25 in | Wt 122.1 lb

## 2022-08-30 DIAGNOSIS — S52502A Unspecified fracture of the lower end of left radius, initial encounter for closed fracture: Secondary | ICD-10-CM | POA: Diagnosis not present

## 2022-08-30 NOTE — Progress Notes (Signed)
Kimberly Davies T. Kimberly Bogart, MD, [place] at [facility] [place], [address]  Phone: [phone]  FAX: [place] - 81 y.o. female  MRN [id number]  Date of Birth: [birth year]  Date: 08/30/2022  PCP: Ria Bush, MD  Referral: Ria Bush, MD  Chief Complaint  Patient presents with  . Follow-up    ER Visit-Left wrist fracture   Subjective:   Kimberly Davies is a 81 y.o. very pleasant female patient with Body mass index is 22.16 kg/m. who presents with the following:  Patient presents after 2 ER visits with falls and a L sided distal radius fracture.  She is currently immobilized in a splint.   She does have a nondisplaced intra-articular distal radius fracture visualized on the plain film.  She has been in a sugar-tong splint, and they have been having a lot of difficulty with this, and her husband has been having to rewrap this multiple times a day.  When she is having some sundowning at nighttime, she is pulling her splint off.  Review of Systems is noted in the HPI, as appropriate  Objective:   BP 110/60   Pulse 80   Temp 98.3 F (36.8 C) (Oral)   Ht 5' 2.25" (1.581 m)   Wt 122 lb 2 oz (55.4 kg)   SpO2 98%   BMI 22.16 kg/m   GEN: No acute distress; alert,appropriate. PULM: Breathing comfortably in no respiratory distress PSYCH: Normally interactive.   I did not reexamine the fracture site.  Laboratory and Imaging Data: CLINICAL DATA:  Blunt trauma. Fell last night 08/25/2022. Pain in left wrist. Swelling of left hand.   EXAM: LEFT WRIST - COMPLETE 3+ VIEW   COMPARISON:  Left forearm radiographs 01/03/2022   FINDINGS: Severe thumb carpometacarpal joint space narrowing with bone-on-bone contact, subchondral sclerosis, and peripheral osteophytosis. Severe triscaphe joint space narrowing. Small chronic ossicle at the lateral aspect of the trapezium. Moderate to severe  thumb interphalangeal and moderate thumb metacarpophalangeal joint space narrowing.   There is mild angulation of the far medial aspect of the distal radial metaphyseal cortex, and there is a linear lucency at the medial aspect of the distal radial articular surface on the final (oblique) provided image. Findings are suspicious for an acute fracture. There also appears to be mild cortical angulation at the dorsal aspect of the distal radial diaphysis on lateral view.   There is high-grade regional soft tissue swelling.   IMPRESSION: Acute intra-articular fracture of the distal radius, favored to extend from the medial metaphysis through the distal articular surface near the lunate. No significant displacement.     Electronically Signed   By: Yvonne Kendall M.D.   On: 08/26/2022 14:23    Assessment and Plan:     ICD-10-CM   1. Closed fracture of distal end of left radius, unspecified fracture morphology, initial encounter  S52.502A Ambulatory referral to Orthopedic Surgery     Nondisplaced intra-articular fracture of the distal radius.  With problems with the sugar-tong splint, we are going to remove this and replaced with a removable prefabricated splint.  I think with this intra-articular fracture, she would be best served by following up and management by orthopedic surgery, and we will help coordinate this and refer her thus.  Medications Discontinued During This Encounter  Medication Reason  . QUEtiapine (SEROQUEL) 25 MG tablet Duplicate    Orders placed today for conditions managed today: Orders Placed This Encounter  Procedures  . Ambulatory referral to Orthopedic Surgery    Disposition: No follow-ups on file.  [employer] One speech-to-text software was used for transcription in this dictation.  Possible transcriptional errors can occur using Editor, commissioning.   Signed,  Maud Deed. Jahmire Ruffins, MD   Outpatient Encounter Medications as of 08/30/2022   Medication Sig  . apixaban (ELIQUIS) 2.5 MG TABS tablet Take 1 tablet (2.5 mg total) by mouth 2 (two) times daily.  . Cholecalciferol (VITAMIN D3) 1.25 MG (50000 UT) TABS Take 1 tablet by mouth once a week.  . denosumab (PROLIA) 60 MG/ML SOSY injection Inject 60 mg into the skin every 6 (six) months.  Marland Kitchen LORazepam (ATIVAN) 0.5 MG tablet Take 0.5 mg by mouth daily as needed.  . memantine (NAMENDA) 10 MG tablet TAKE 1 TABLET BY MOUTH TWICE A DAY  . QUEtiapine (SEROQUEL) 25 MG tablet Take 50 mg by mouth at bedtime.  . sertraline (ZOLOFT) 50 MG tablet TAKE 1 TABLET BY MOUTH EVERY DAY  . vitamin B-12 (CYANOCOBALAMIN) 1000 MCG tablet Take 1 tablet (1,000 mcg total) by mouth every Monday, Wednesday, and Friday.  . [DISCONTINUED] QUEtiapine (SEROQUEL) 25 MG tablet TAKE 1 TABLET BY MOUTH EVERYDAY AT BEDTIME (Patient taking differently: Take 25 mg by mouth at bedtime.)   No facility-administered encounter medications on file as of 08/30/2022.

## 2022-11-01 ENCOUNTER — Observation Stay: Payer: Medicare Other

## 2022-11-01 ENCOUNTER — Emergency Department: Payer: Medicare Other

## 2022-11-01 ENCOUNTER — Other Ambulatory Visit: Payer: Self-pay

## 2022-11-01 ENCOUNTER — Encounter: Payer: Self-pay | Admitting: Radiology

## 2022-11-01 ENCOUNTER — Observation Stay
Admission: EM | Admit: 2022-11-01 | Discharge: 2022-11-02 | Disposition: A | Discharge status: Hospice | Payer: Medicare Other | Attending: Internal Medicine | Admitting: Internal Medicine

## 2022-11-01 ENCOUNTER — Telehealth: Payer: Self-pay

## 2022-11-01 DIAGNOSIS — Z7901 Long term (current) use of anticoagulants: Secondary | ICD-10-CM

## 2022-11-01 DIAGNOSIS — Z79899 Other long term (current) drug therapy: Secondary | ICD-10-CM | POA: Diagnosis not present

## 2022-11-01 DIAGNOSIS — I82402 Acute embolism and thrombosis of unspecified deep veins of left lower extremity: Secondary | ICD-10-CM | POA: Diagnosis present

## 2022-11-01 DIAGNOSIS — G309 Alzheimer's disease, unspecified: Secondary | ICD-10-CM | POA: Diagnosis not present

## 2022-11-01 DIAGNOSIS — G459 Transient cerebral ischemic attack, unspecified: Secondary | ICD-10-CM | POA: Diagnosis present

## 2022-11-01 DIAGNOSIS — F0284 Dementia in other diseases classified elsewhere, unspecified severity, with anxiety: Secondary | ICD-10-CM | POA: Insufficient documentation

## 2022-11-01 DIAGNOSIS — F411 Generalized anxiety disorder: Secondary | ICD-10-CM | POA: Diagnosis present

## 2022-11-01 DIAGNOSIS — F0280 Dementia in other diseases classified elsewhere without behavioral disturbance: Secondary | ICD-10-CM | POA: Diagnosis present

## 2022-11-01 DIAGNOSIS — Z515 Encounter for palliative care: Secondary | ICD-10-CM

## 2022-11-01 DIAGNOSIS — R29898 Other symptoms and signs involving the musculoskeletal system: Secondary | ICD-10-CM

## 2022-11-01 DIAGNOSIS — Z86718 Personal history of other venous thrombosis and embolism: Secondary | ICD-10-CM | POA: Insufficient documentation

## 2022-11-01 DIAGNOSIS — F05 Delirium due to known physiological condition: Secondary | ICD-10-CM | POA: Diagnosis present

## 2022-11-01 DIAGNOSIS — R531 Weakness: Principal | ICD-10-CM | POA: Insufficient documentation

## 2022-11-01 LAB — COMPREHENSIVE METABOLIC PANEL
ALT: 17 U/L (ref 0–44)
AST: 30 U/L (ref 15–41)
Albumin: 4.2 g/dL (ref 3.5–5.0)
Alkaline Phosphatase: 81 U/L (ref 38–126)
Anion gap: 10 (ref 5–15)
BUN: 14 mg/dL (ref 8–23)
CO2: 27 mmol/L (ref 22–32)
Calcium: 9.7 mg/dL (ref 8.9–10.3)
Chloride: 105 mmol/L (ref 98–111)
Creatinine, Ser: 0.71 mg/dL (ref 0.44–1.00)
GFR, Estimated: >60 mL/min (ref >60)
Glucose, Bld: 143 mg/dL — ABNORMAL HIGH (ref 70–99)
Potassium: 3.4 mmol/L — ABNORMAL LOW (ref 3.5–5.1)
Sodium: 142 mmol/L (ref 135–145)
Total Bilirubin: 0.5 mg/dL (ref 0.3–1.2)
Total Protein: 8.3 g/dL — ABNORMAL HIGH (ref 6.5–8.1)

## 2022-11-01 LAB — CBC
HCT: 39 % (ref 36.0–46.0)
Hemoglobin: 12.3 g/dL (ref 12.0–15.0)
MCH: 27.9 pg (ref 26.0–34.0)
MCHC: 31.5 g/dL (ref 30.0–36.0)
MCV: 88.4 fL (ref 80.0–100.0)
Platelets: 421 10*3/uL — ABNORMAL HIGH (ref 150–400)
RBC: 4.41 MIL/uL (ref 3.87–5.11)
RDW: 13.8 % (ref 11.5–15.5)
WBC: 8.9 10*3/uL (ref 4.0–10.5)
nRBC: 0 % (ref 0.0–0.2)

## 2022-11-01 LAB — URINE DRUG SCREEN, QUALITATIVE (ARMC ONLY)
Amphetamines, Ur Screen: NOT DETECTED
Barbiturates, Ur Screen: NOT DETECTED
Benzodiazepine, Ur Scrn: NOT DETECTED
Cannabinoid 50 Ng, Ur ~~LOC~~: NOT DETECTED
Cocaine Metabolite,Ur ~~LOC~~: NOT DETECTED
MDMA (Ecstasy)Ur Screen: NOT DETECTED
Methadone Scn, Ur: NOT DETECTED
Opiate, Ur Screen: NOT DETECTED
Phencyclidine (PCP) Ur S: NOT DETECTED
Tricyclic, Ur Screen: NOT DETECTED

## 2022-11-01 LAB — DIFFERENTIAL
Abs Immature Granulocytes: 0.03 10*3/uL (ref 0.00–0.07)
Basophils Absolute: 0.1 10*3/uL (ref 0.0–0.1)
Basophils Relative: 1 %
Eosinophils Absolute: 0.1 10*3/uL (ref 0.0–0.5)
Eosinophils Relative: 1 %
Immature Granulocytes: 0 %
Lymphocytes Relative: 32 %
Lymphs Abs: 2.8 10*3/uL (ref 0.7–4.0)
Monocytes Absolute: 0.7 10*3/uL (ref 0.1–1.0)
Monocytes Relative: 8 %
Neutro Abs: 5.2 10*3/uL (ref 1.7–7.7)
Neutrophils Relative %: 58 %

## 2022-11-01 LAB — CBG MONITORING, ED: Glucose-Capillary: 137 mg/dL — ABNORMAL HIGH (ref 70–99)

## 2022-11-01 LAB — TROPONIN I (HIGH SENSITIVITY)
Troponin I (High Sensitivity): 3 ng/L (ref <18)
Troponin I (High Sensitivity): 4 ng/L (ref <18)

## 2022-11-01 LAB — PROTIME-INR
INR: 1.1 (ref 0.8–1.2)
Prothrombin Time: 14.1 seconds (ref 11.4–15.2)

## 2022-11-01 LAB — APTT: aPTT: 29 seconds (ref 24–36)

## 2022-11-01 LAB — ETHANOL: Alcohol, Ethyl (B): <10 mg/dL (ref <10)

## 2022-11-01 MED ORDER — CYANOCOBALAMIN 500 MCG PO TABS
1000.0000 ug | ORAL_TABLET | ORAL | Status: DC
  Filled 2022-11-01: qty 2

## 2022-11-01 MED ORDER — ACETAMINOPHEN 325 MG RE SUPP: 650.0000 mg | RECTAL | Status: DC | PRN

## 2022-11-01 MED ORDER — STROKE: EARLY STAGES OF RECOVERY BOOK: Freq: Once | Status: DC

## 2022-11-01 MED ORDER — MEMANTINE HCL 5 MG PO TABS
10.0000 mg | ORAL_TABLET | Freq: Two times a day (BID) | ORAL | Status: DC
  Administered 2022-11-01 – 2022-11-02 (×2): 10 mg via ORAL
  Filled 2022-11-01 (×2): qty 2

## 2022-11-01 MED ORDER — ACETAMINOPHEN 325 MG PO TABS: 650.0000 mg | ORAL_TABLET | ORAL | Status: DC | PRN

## 2022-11-01 MED ORDER — VITAMIN D (ERGOCALCIFEROL) 1.25 MG (50000 UNIT) PO CAPS: 50000.0000 [IU] | ORAL_CAPSULE | ORAL | Status: DC

## 2022-11-01 MED ORDER — ACETAMINOPHEN 160 MG/5ML PO SOLN: 650.0000 mg | ORAL | Status: DC | PRN

## 2022-11-01 MED ORDER — QUETIAPINE FUMARATE 25 MG PO TABS
50.0000 mg | ORAL_TABLET | Freq: Every day | ORAL | Status: DC
  Administered 2022-11-01: 50 mg via ORAL
  Filled 2022-11-01: qty 2

## 2022-11-01 MED ORDER — LORAZEPAM 0.5 MG PO TABS: 0.5000 mg | ORAL_TABLET | Freq: Every day | ORAL | Status: DC | PRN

## 2022-11-01 MED ORDER — LABETALOL HCL 5 MG/ML IV SOLN: 5.0000 mg | INTRAVENOUS | Status: DC | PRN

## 2022-11-01 MED ORDER — LORAZEPAM 2 MG/ML IJ SOLN
1.0000 mg | Freq: Once | INTRAMUSCULAR | Status: AC | PRN
  Administered 2022-11-01: 1 mg via INTRAVENOUS
  Filled 2022-11-01: qty 1

## 2022-11-01 MED ORDER — APIXABAN 2.5 MG PO TABS
2.5000 mg | ORAL_TABLET | Freq: Two times a day (BID) | ORAL | Status: DC
  Administered 2022-11-01 – 2022-11-02 (×2): 2.5 mg via ORAL
  Filled 2022-11-01 (×2): qty 1

## 2022-11-01 MED ORDER — SERTRALINE HCL 50 MG PO TABS
50.0000 mg | ORAL_TABLET | Freq: Every day | ORAL | Status: DC
  Administered 2022-11-02: 50 mg via ORAL
  Filled 2022-11-01: qty 1

## 2022-11-01 MED ORDER — SODIUM CHLORIDE 0.9% FLUSH: 3.0000 mL | Freq: Once | INTRAVENOUS | Status: DC

## 2022-11-01 MED ORDER — SENNOSIDES-DOCUSATE SODIUM 8.6-50 MG PO TABS: 1.0000 | ORAL_TABLET | Freq: Every evening | ORAL | Status: DC | PRN

## 2022-11-01 NOTE — ED Provider Notes (Signed)
[facility] Provider Note    Event Date/Time   First MD Initiated Contact with Patient 11/01/22 1823     (approximate)   History   Weakness   HPI  Kimberly Davies is a 81 y.o. female   Past medical history of DVT on eliquis, HLD, dementia  Here with gait instability and leaning to the right side, right leg weakness, dysarthria.  Last known normal was 8:00 last night.  No other medical complaints or recent illnesses or injuries.  On triage noted to have dysarthria.  Husband states right-sided leg weakness and gait instability leaning to the right.  Dementia at baseline, disoriented at baseline.  Patient without other complaints.  History was obtained by patient though minimal given her baseline dementia.  Her husband is at bedside as an independent historian to offer most of the history gathered as above.  Reviewed external medical notes including a CT scan of the head without contrast obtained on 08/27/2022 which showed no evidence of bleeding or infarctions.      Physical Exam   Triage Vital Signs: ED Triage Vitals  Enc Vitals Group     BP 11/01/22 1406 115/61     Pulse Rate 11/01/22 1406 82     Resp 11/01/22 1406 17     Temp 11/01/22 1406 98.4 F (36.9 C)     Temp Source 11/01/22 1406 Oral     SpO2 11/01/22 1406 94 %     Weight 11/01/22 1408 121 lb (54.9 kg)     Height 11/01/22 1408 '5\' 2"'$  (1.575 m)     Head Circumference --      Peak Flow --      Pain Score 11/01/22 1849 0     Pain Loc --      Pain Edu? --      Excl. in [place]? --     Most recent vital signs: Vitals:   11/01/22 1900 11/01/22 2014  BP: 129/73   Pulse: 81   Resp: 18   Temp:    SpO2: 96% 96%    General: Awake, no distress.  CV:  Good peripheral perfusion.  Resp:  Normal effort.  Abd:  No distention.  Other:  Awake alert and disoriented, which is baseline per husband.  She is able to move all extremities with full active range of motion and there are no motor deficits  noted on my exam.  No sensory deficits and no facial asymmetry.  No dysarthria.  Extraocular movements are intact.  Gait is unsteady, slow, careful, which is baseline per husband.   ED Results / Procedures / Treatments   Labs (all labs ordered are listed, but only abnormal results are displayed) Labs Reviewed  CBC - Abnormal; Notable for the following components:      Result Value   Platelets 421 (*)    All other components within normal limits  COMPREHENSIVE METABOLIC PANEL - Abnormal; Notable for the following components:   Potassium 3.4 (*)    Glucose, Bld 143 (*)    Total Protein 8.3 (*)    All other components within normal limits  CBG MONITORING, ED - Abnormal; Notable for the following components:   Glucose-Capillary 137 (*)    All other components within normal limits  PROTIME-INR  APTT  DIFFERENTIAL  ETHANOL  URINE DRUG SCREEN, QUALITATIVE ([facility] ONLY)  LIPID PANEL  HEMOGLOBIN A1C  TROPONIN I (HIGH SENSITIVITY)  TROPONIN I (HIGH SENSITIVITY)     I reviewed labs and  they are notable for a glucose of 137.  EKG  ED ECG REPORT I, Lucillie Garfinkel, the attending physician, personally viewed and interpreted this ECG.   Date: 11/01/2022  EKG Time: 1841  Rate: 74  Rhythm: normal sinus rhythm  Axis: nl  Intervals:none  ST&T Change: no ischemic changes    RADIOLOGY I independently reviewed and interpreted no acute ischemic changes.   PROCEDURES:  Critical Care performed: No  Procedures   MEDICATIONS ORDERED IN ED: Medications  sodium chloride flush (NS) 0.9 % injection 3 mL (3 mLs Intravenous Not Given 11/01/22 1842)   stroke: early stages of recovery book (has no administration in time range)  acetaminophen (TYLENOL) tablet 650 mg (has no administration in time range)    Or  acetaminophen (TYLENOL) 160 MG/5ML solution 650 mg (has no administration in time range)    Or  acetaminophen (TYLENOL) suppository 650 mg (has no administration in time range)   senna-docusate (Senokot-S) tablet 1 tablet (has no administration in time range)  labetalol (NORMODYNE) injection 5 mg (has no administration in time range)  LORazepam (ATIVAN) injection 1 mg (has no administration in time range)    Consultants:  I spoke with hospitalist for admission and regarding care plan for this patient.   IMPRESSION / MDM / ASSESSMENT AND PLAN / ED COURSE  I reviewed the triage vital signs and the nursing notes.                              Differential diagnosis includes, but is not limited to, CVA, TIA, metabolic derangement/hypoglycemia, dysrhythmia   The patient is on the cardiac monitor to evaluate for evidence of arrhythmia and/or significant heart rate changes.  MDM: Patient with transient and completely resolved neurologic deficits, concern for TIA.  Fortunately CT scan was negative.  Labs unremarkable.  Patient is at baseline now.  Admit for TIA work-up.   Patient's presentation is most consistent with acute presentation with potential threat to life or bodily function.       FINAL CLINICAL IMPRESSION(S) / ED DIAGNOSES   Final diagnoses:  Right leg weakness     Rx / DC Orders   ED Discharge Orders     None        Note:  This document was prepared using Dragon voice recognition software and may include unintentional dictation errors.    Lucillie Garfinkel, MD 11/01/22 2040

## 2022-11-01 NOTE — Assessment & Plan Note (Signed)
-   Patient reports this is the dose that her medical oncologist has put her on - Apixaban 2.5 mg p.o. twice daily resumed

## 2022-11-01 NOTE — Telephone Encounter (Signed)
I spoke with pts husband (DPR signed) and Mr Potvin said pt does not have any other symptoms and I advised could be different things but one possibility with leaning to right, difficulty in sitting and walking and dragging rt foot pt needs to be evaluated and tested for possible stroke. Mr Zagal said [place] and he will take pt to [place]. Sending note to Dr Darnell Level and G pool.

## 2022-11-01 NOTE — ED Triage Notes (Signed)
At 3am pt woke and was leaning to the right. She has been awake since constantly leaning to the right. Pt sitter came today and has noticed that she was not picking her right leg up as well as she normally does.Neuro assessement reveals right face droop.

## 2022-11-01 NOTE — Assessment & Plan Note (Signed)
-   Apixaban 2.5 mg p.o. twice daily resumed

## 2022-11-01 NOTE — Hospital Course (Addendum)
Ms. Kimberly Davies is a 81 year old female history of advanced Alzheimer dementia, history of recurrent DVT, currently on Eliquis, prior history of GI bleed on Xarelto, generalized anxiety, history of sundowning, imbalance, who presents emergency department from home via POV for chief concerns of dysarthria with gait changes and right-sided leg weakness.  Initial vitals in the emergency department show temperature of 98.4, respiration rate of 17, heart rate of 82, blood pressure 115/61, SpO2 of 94% on room air.  Serum sodium is 142, potassium 3.4, chloride of 105, bicarb 27, BUN of 14, serum creatinine 0.71, EGFR greater than 60, nonfasting blood glucose 143, WBC 8.9, hemoglobin 12.3, platelets of 421.  High sensitive troponin is 3.  EtOH level was negative.  CT head without contrast: Was read as no acute intracranial abnormality.  Chronic microvascular ischemic change and cerebral volume loss.  ED treatment: None

## 2022-11-01 NOTE — ED Triage Notes (Signed)
Pt to ED via POV for concerns of possible CVA. Pt husband states that when patient woke up this morning around 0300 she was leaning to the right. Pt husband also reports that pt was dragging her right foot per caregiver. LKW last night at 830pm. Pt husband states that pt does not have slurred speech. Pt does not have facial droop at this time and is able to follow commands.

## 2022-11-01 NOTE — Assessment & Plan Note (Signed)
-   Patient takes memantine 10 mg twice daily

## 2022-11-01 NOTE — Assessment & Plan Note (Signed)
-   Patient takes quetiapine 50 mg nightly and sertraline 50 mg daily

## 2022-11-01 NOTE — ED Provider Triage Note (Signed)
Emergency Medicine Provider Triage Evaluation Note  Kimberly Davies , a 81 y.o. female  was evaluated in triage.  Pt arrives with husband who reports that she awoke this am around 3am. Since then, she has been leaning to the right and dragging her right foot. History of advanced dementia and is unable to self report any symptom of concern.  Physical Exam  There were no vitals taken for this visit. Gen:   Awake, no distress   Resp:  Normal effort  MSK:   Moves extremities without difficulty  Other:    Medical Decision Making  Medically screening exam initiated at 2:06 PM.  Appropriate orders placed.  Kimberly Davies was informed that the remainder of the evaluation will be completed by another provider, this initial triage assessment does not replace that evaluation, and the importance of remaining in the ED until their evaluation is complete.  LKW 8:30pm.   Victorino Dike, FNP 11/01/22 1411

## 2022-11-01 NOTE — Assessment & Plan Note (Addendum)
Stroke-like symptoms - AM team to consult neurology as appropriate - Complete echo and MRI of the brain ordered - Fasting lipid and A1c ordered - Permissive hypertension: Labetalol 5 mg IV every 4 hours as needed for SBP greater than 185, 4 doses ordered - Frequent neuro vascular checks - Heart healthy diet as patient passed nursing bedside swallow study - Fall precaution

## 2022-11-01 NOTE — Assessment & Plan Note (Signed)
-   Resumed home Ativan 0.5 mg daily as needed for anxiety

## 2022-11-01 NOTE — Telephone Encounter (Signed)
Symptoms started last evening. Will await evaluation.

## 2022-11-01 NOTE — ED Triage Notes (Signed)
Pt is on eliquis 
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
## 2022-11-01 NOTE — Telephone Encounter (Signed)
[place] [facility] - Client TELEPHONE ADVICE RECORD [facility] Patient Name: Kimberly Davies [facility] Gender: Female DOB: 03-15-1941 Age: 81 Y 11 M 3 D Return Phone Number: [phone] (Primary), [phone] (Secondary) Address: [place]/ State/ Zip: [place] [address] Client Kimberly Davies - Client Client Site [place] Provider Ria Bush - MD Contact Type Call Who Is Calling Patient / Member / Family / Caregiver Call Type Triage / Clinical Caller Name Kimberly Davies Relationship To Patient Spouse Return Phone Number [phone] (Secondary) Chief Complaint Walking difficulty Reason for Call Symptomatic / Request for Health Information Initial Comment His wife has dementia, lately when walking or sitting she leans to the right, has to help her sit and walk right. Seems like she looses balance when walking. Wants to know if this part of dementia or not. Translation No Nurse Assessment Nurse: Raphael Gibney, RN, Vanita Ingles Date/Time (Eastern Time): 11/01/2022 10:44:17 AM Confirm and document reason for call. If symptomatic, describe symptoms. ---Caller states spouse has dementia. She is leaning to the right when she walks. right foot is dragging. symptoms started last evening. Does the patient have any new or worsening symptoms? ---Yes Will a triage be completed? ---Yes Related visit to physician within the last 2 weeks? ---No Does the PT have any chronic conditions? (i.e. diabetes, asthma, this includes High risk factors for pregnancy, etc.) ---Yes List chronic conditions. ---dementia; Is this a behavioral health or substance abuse call? ---No Guidelines Guideline Title Affirmed Question Affirmed Notes Nurse Date/Time (Eastern Time) Neurologic Deficit [1] Weakness of the face, arm / hand, or leg / foot on one side of the body AND [2] gradual onset (e.g., days to weeks) AND [3]  present now Raphael Gibney, Therapist, sports, Vanita Ingles 11/01/2022 10:47:48 AM PLEASE NOTE: All timestamps contained within this report are represented as [place] Standard Time. CONFIDENTIALTY NOTICE: This fax transmission is intended only for the addressee. It contains information that is legally privileged, confidential or otherwise protected from use or disclosure. If you are not the intended recipient, you are strictly prohibited from reviewing, disclosing, copying using or disseminating any of this information or taking any action in reliance on or regarding this information. If you have received this fax in error, please notify us immediately by telephone so that we can arrange for its return to [place]. Phone: [phone], Toll-Free: [phone], Fax: [phone] Page: 2 of 2 Call Id: [id number] [place]. Time [name] Time) Disposition Final User 11/01/2022 10:51:18 AM Go to ED Now Yes Raphael Gibney, RN, Vanita Ingles Final Disposition 11/01/2022 10:51:18 AM Go to ED Now Yes Raphael Gibney, RN, Vanita Ingles Disposition Overriden: See HCP within 4 Hours (or PCP triage) Override Reason: Patient's symptoms need a higher level of care Caller Disagree/Comply Comply Caller Understands Yes PreDisposition Did not know what to do Care Advice Given Per Guideline GO TO ED NOW: * You need to be seen in the Emergency Department. NOTE TO TRIAGER - DRIVING: * Another adult should drive. ANOTHER ADULT SHOULD DRIVE: * It is better and safer if another adult drives instead of you. Comments User: Dannielle Burn, RN Date/Time [name] Time): 11/01/2022 10:50:58 AM triage outcome upgraded to go to ER now as pt is leaning to the right. Referrals GO TO FACILITY UNDECIDE

## 2022-11-02 ENCOUNTER — Telehealth: Payer: Self-pay | Admitting: Family Medicine

## 2022-11-02 ENCOUNTER — Observation Stay
Admit: 2022-11-02 | Discharge: 2022-11-02 | Disposition: A | Discharge status: Home / Self Care | Payer: Medicare Other | Attending: Internal Medicine | Admitting: Internal Medicine

## 2022-11-02 ENCOUNTER — Other Ambulatory Visit: Payer: Self-pay | Admitting: Family Medicine

## 2022-11-02 DIAGNOSIS — Z515 Encounter for palliative care: Secondary | ICD-10-CM

## 2022-11-02 DIAGNOSIS — F411 Generalized anxiety disorder: Secondary | ICD-10-CM | POA: Diagnosis not present

## 2022-11-02 DIAGNOSIS — G459 Transient cerebral ischemic attack, unspecified: Secondary | ICD-10-CM | POA: Diagnosis not present

## 2022-11-02 DIAGNOSIS — R29898 Other symptoms and signs involving the musculoskeletal system: Secondary | ICD-10-CM | POA: Diagnosis not present

## 2022-11-02 DIAGNOSIS — F02B18 Dementia in other diseases classified elsewhere, moderate, with other behavioral disturbance: Secondary | ICD-10-CM

## 2022-11-02 DIAGNOSIS — G309 Alzheimer's disease, unspecified: Secondary | ICD-10-CM

## 2022-11-02 DIAGNOSIS — E559 Vitamin D deficiency, unspecified: Secondary | ICD-10-CM

## 2022-11-02 LAB — LIPID PANEL
Cholesterol: 240 mg/dL — ABNORMAL HIGH (ref 0–200)
HDL: 61 mg/dL (ref >40)
LDL Cholesterol: 170 mg/dL — ABNORMAL HIGH (ref 0–99)
Total CHOL/HDL Ratio: 3.9 RATIO
Triglycerides: 44 mg/dL (ref <150)
VLDL: 9 mg/dL (ref 0–40)

## 2022-11-02 NOTE — Progress Notes (Signed)
Manufacturing engineer [facility]) Hospital Liaison Note   Received request from MD, Max Sane, for hospice services at home after discharge. TOC/Jeanna aware, Chart and patient information under review by [facility] physician. Hospice eligibility approved.   Spoke with spouse/Dyer to initiate education related to hospice philosophy, services, and team approach to care. He verbalized understanding of information given. Per discussion, the plan is for patient to discharge home via POV once cleared to DC.    DME needs discussed. Patient has the following equipment in the home (Purchased privately): Walker Patient requests the following equipment for delivery: N/A  Address verified and is correct in the chart.   Please send signed and completed DNR home with patient/family. Please provide prescriptions at discharge as needed to ensure ongoing symptom management.    [facility] information and contact numbers given to family & above information shared with TOC.   Please call with any questions/concerns.    Thank you for the opportunity to participate in this patient's care.   Daphene Calamity, MSW [facility] Liaison  [phone]

## 2022-11-02 NOTE — Telephone Encounter (Signed)
Kimberly Davies from [facility] called stating the pt is asking for Dr. Darnell Level to become her attending hospice physician? Call back # [phone], secured

## 2022-11-02 NOTE — IPAL (Signed)
  Interdisciplinary Goals of Care Family Meeting   Date carried out: 11/02/2022  Location of the meeting: Phone conference  Member's involved: Physician and Family Member or next of kin  [place] or acting medical decision maker: Husband/HCPOA  Discussion: We discussed goals of care for [place] status: Full DNR  Disposition: Home with Hospice  Time spent for the meeting: 35 minutes    Max Sane, MD  11/02/2022, 12:24 PM

## 2022-11-03 ENCOUNTER — Telehealth: Payer: Self-pay

## 2022-11-03 LAB — HEMOGLOBIN A1C
Hgb A1c MFr Bld: 5.4 % (ref 4.8–5.6)
Mean Plasma Glucose: 108 mg/dL

## 2022-11-03 NOTE — Telephone Encounter (Signed)
Spoke with [place] notifying her Dr. Darnell Level agrees to be the attending for pt.  Gwen Pounds expresses her thanks.

## 2022-11-03 NOTE — Telephone Encounter (Signed)
Transition Care Management Unsuccessful Follow-up Telephone Call  Date of discharge and from where:  [place] 11-02-22 Dx: weakness   Attempts:  1st Attempt  Reason for unsuccessful TCM follow-up call:  Left voice message   Juanda Crumble LPN [place] Direct Dial [phone]

## 2022-11-03 NOTE — Telephone Encounter (Signed)
[place] for me to be attending for hospice. Thank you.

## 2022-11-03 NOTE — Telephone Encounter (Signed)
[facility] called in advise that Dr Darnell Level is ok with being pt attending for hospice

## 2022-11-03 NOTE — Telephone Encounter (Signed)
Transition Care Management Follow-up Telephone Call Date of discharge and from where:   [place] 11-02-22 Dx: weakness    How have you been since you were released from the hospital? weak Any questions or concerns? No  Items Reviewed: Did the pt receive and understand the discharge instructions provided? Yes  Medications obtained and verified? Yes  Other? No  Any new allergies since your discharge? No  Dietary orders reviewed? Yes Do you have support at home? Yes   Home Care and Equipment/Supplies: Were home health services ordered? no If so, what is the name of the agency? na  Has the agency set up a time to come to the patient's home? not applicable Were any new equipment or medical supplies ordered?  No What is the name of the medical supply agency? na Were you able to get the supplies/equipment? not applicable Do you have any questions related to the use of the equipment or supplies? No  Functional Questionnaire: (I = Independent and D = Dependent) ADLs: D  Bathing/Dressing- D  Meal Prep- D  Eating- I- with assistance   Maintaining continence- D  Transferring/Ambulation- D  Managing Meds- D-husband manages   Follow up appointments reviewed:  PCP Hospital f/u appt confirmed? Yes  Scheduled to see Dr Danise Mina on 11-09-22 @ [facility] f/u appt confirmed? Yes Dr Melrose Nakayama 11-22-22 at 115pm Are transportation arrangements needed? No  If their condition worsens, is the pt aware to call PCP or go to the Emergency Dept.? Yes Was the patient provided with contact information for the PCP's office or ED? Yes Was to pt encouraged to call back with questions or concerns? Yes   Juanda Crumble LPN [place] Direct Dial [phone]

## 2022-11-04 ENCOUNTER — Other Ambulatory Visit (HOSPITAL_COMMUNITY): Payer: Self-pay

## 2022-11-04 ENCOUNTER — Telehealth: Payer: Self-pay

## 2022-11-04 NOTE — Telephone Encounter (Signed)
Pt ready for scheduling on or after 11/23/2022  Out-of-pocket cost due at time of visit: $30  Primary: Medicare Prolia co-insurance: 0% (100% covered after OOP is met- $900 out of $900 met) Admin fee co-insurance: $30  Secondary:  Prolia co-insurance:  Admin fee co-insurance:   Deductible:   Prior Auth:  PA# Valid: 05/18/2022-05/19/2023  ** This summary of benefits is an estimation of the patient's out-of-pocket cost. Exact cost may vary based on individual plan coverage.

## 2022-11-09 ENCOUNTER — Encounter: Payer: Self-pay | Admitting: Family Medicine

## 2022-11-09 ENCOUNTER — Ambulatory Visit (INDEPENDENT_AMBULATORY_CARE_PROVIDER_SITE_OTHER): Payer: Medicare Other | Admitting: Family Medicine

## 2022-11-09 ENCOUNTER — Other Ambulatory Visit (HOSPITAL_COMMUNITY): Payer: Self-pay

## 2022-11-09 ENCOUNTER — Telehealth: Payer: Self-pay

## 2022-11-09 VITALS — BP 122/64 | HR 78 | Temp 97.2°F | Ht 62.0 in | Wt 120.8 lb

## 2022-11-09 DIAGNOSIS — G309 Alzheimer's disease, unspecified: Secondary | ICD-10-CM

## 2022-11-09 DIAGNOSIS — Z8673 Personal history of transient ischemic attack (TIA), and cerebral infarction without residual deficits: Secondary | ICD-10-CM | POA: Diagnosis not present

## 2022-11-09 DIAGNOSIS — G459 Transient cerebral ischemic attack, unspecified: Secondary | ICD-10-CM

## 2022-11-09 DIAGNOSIS — Z23 Encounter for immunization: Secondary | ICD-10-CM

## 2022-11-09 DIAGNOSIS — M810 Age-related osteoporosis without current pathological fracture: Secondary | ICD-10-CM

## 2022-11-09 DIAGNOSIS — F02B18 Dementia in other diseases classified elsewhere, moderate, with other behavioral disturbance: Secondary | ICD-10-CM

## 2022-11-09 DIAGNOSIS — Z515 Encounter for palliative care: Secondary | ICD-10-CM

## 2022-11-09 NOTE — Telephone Encounter (Signed)
Pharmacy Patient Advocate Encounter  Prolia due 11/26/22  Insurance verification completed.    The patient is insured through [employer] for: Prolia.  Pharmacy benefit copay: $505.51 (patient in coverage gap)

## 2022-11-09 NOTE — Patient Instructions (Addendum)
Flu shot today Schedule nurse visit for Prolia injection after 11/25/2022.  Good to see you today.  Return in 7 months for wellness visit/physical.

## 2022-11-09 NOTE — Progress Notes (Unsigned)
Patient ID: Kimberly Davies, female    DOB: [birth year], 81 y.o.   MRN: [id number]  This visit was conducted in person.  BP 122/64   Pulse 78   Temp (!) 97.2 F (36.2 C)   Ht '5\' 2"'$  (1.575 m)   Wt 120 lb 12.8 oz (54.8 kg)   SpO2 98%   BMI 22.09 kg/m    CC: hosp f/u visit  Subjective:   HPI: Kimberly Davies is a 81 y.o. female presenting on 11/09/2022 for Hospitalization Follow-up (Seen on 11/01/22 at [facility] ED, dx R leg weakness. Pt accompanied by husband, [name]. )   Recent hospitalization for dysarthria with R sided leg weakness. Had reassuring head CT (chronic microvascular ischemic changes) and brain MRI (chronic small vessel ischemia with volume loss). Symptoms resolved at ER.  Thought TIA due to stroke-like symptoms.  Hospital records reviewed. Med rec performed.   Since home, she's had progressive daily recovery. Still leaning somewhat to the right. Slurred speech has resolved.   Home health not set up. Set up with [facility] hospice at home.  Hospice evaluated her and didn't think she was hospice eligible.  Other follow up appointments scheduled: 11/22/2022 with Dr Melrose Nakayama.  [place] [facility] f/u 12/202/2023 for L wrist fracture. Onc f/u with Dr Tasia Catchings appt 02/03/2023.   Pending Prolia injection on or after 11/23/2022. This has already been approved.   Pending glasses for mild visual difficulty.  She's has difficulty using rolling walker.  They have home aide that comes out 12 hours/wk.  Just completed HHPT course, aide goes through HEP with her.   Sister lives 2 [address] down.  Husband's DIL lives in [place].  ______________________________________________________________________ Hospital admission: 11/01/2022 Hospital discharge: 11/02/2022 TCM f/u phone call: 11/03/2022 performed   Assessment/Plan Principal Problem:   TIA (transient ischemic attack) Active Problems:   Anxiety state   Alzheimer's dementia ([place])   Recurrent acute deep vein thrombosis (DVT) of left lower  extremity (HCC)   Sundowning   Chronic anticoagulation     Relevant past medical, surgical, family and social history reviewed and updated as indicated. Interim medical history since our last visit reviewed. Allergies and medications reviewed and updated. Outpatient Medications Prior to Visit  Medication Sig Dispense Refill   apixaban (ELIQUIS) 2.5 MG TABS tablet Take 1 tablet (2.5 mg total) by mouth 2 (two) times daily. 60 tablet 5   Cholecalciferol (D3-50) 1.25 MG (50000 UT) capsule TAKE 1 CAPSULE BY MOUTH ONE TIME PER WEEK 12 capsule 2   denosumab (PROLIA) 60 MG/ML SOSY injection Inject 60 mg into the skin every 6 (six) months.     LORazepam (ATIVAN) 0.5 MG tablet Take 0.5 mg by mouth daily as needed.     memantine (NAMENDA) 10 MG tablet TAKE 1 TABLET BY MOUTH TWICE A DAY 180 tablet 1   QUEtiapine (SEROQUEL) 25 MG tablet Take 50 mg by mouth at bedtime.     sertraline (ZOLOFT) 50 MG tablet TAKE 1 TABLET BY MOUTH EVERY DAY 90 tablet 0   vitamin B-12 (CYANOCOBALAMIN) 1000 MCG tablet Take 1 tablet (1,000 mcg total) by mouth every Monday, Wednesday, and Friday.     No facility-administered medications prior to visit.     Per HPI unless specifically indicated in ROS section below Review of Systems  Objective:  BP 122/64   Pulse 78   Temp (!) 97.2 F (36.2 C)   Ht '5\' 2"'$  (1.575 m)   Wt 120 lb 12.8 oz (54.8 kg)  SpO2 98%   BMI 22.09 kg/m   Wt Readings from Last 3 Encounters:  11/09/22 120 lb 12.8 oz (54.8 kg)  11/01/22 121 lb (54.9 kg)  08/30/22 122 lb 2 oz (55.4 kg)      Physical Exam Vitals and nursing note reviewed.  Constitutional:      Appearance: Normal appearance. She is not ill-appearing.  HENT:     Head: Normocephalic and atraumatic.     Mouth/Throat:     Comments: Wearing mask Eyes:     Extraocular Movements: Extraocular movements intact.     Pupils: Pupils are equal, round, and reactive to light.  Cardiovascular:     Rate and Rhythm: Normal rate and regular  rhythm.     Pulses: Normal pulses.     Heart sounds: Normal heart sounds. No murmur heard. Pulmonary:     Effort: Pulmonary effort is normal. No respiratory distress.     Breath sounds: Normal breath sounds. No wheezing, rhonchi or rales.  Musculoskeletal:     Right lower leg: No edema.     Left lower leg: No edema.  Skin:    General: Skin is warm and dry.     Findings: No rash.  Neurological:     General: No focal deficit present.     Mental Status: She is alert.     Comments:  Slowed gait, needs assistance from husband Follows commands Symmetrically equal strength BUE, grip strength       Results for orders placed or performed during the hospital encounter of 11/01/22  Protime-INR  Result Value Ref Range   Prothrombin Time 14.1 11.4 - 15.2 seconds   INR 1.1 0.8 - 1.2  APTT  Result Value Ref Range   aPTT 29 24 - 36 seconds  CBC  Result Value Ref Range   WBC 8.9 4.0 - 10.5 K/uL   RBC 4.41 3.87 - 5.11 MIL/uL   Hemoglobin 12.3 12.0 - 15.0 g/dL   HCT 39.0 36.0 - 46.0 %   MCV 88.4 80.0 - 100.0 fL   MCH 27.9 26.0 - 34.0 pg   MCHC 31.5 30.0 - 36.0 g/dL   RDW 13.8 11.5 - 15.5 %   Platelets 421 (H) 150 - 400 K/uL   nRBC 0.0 0.0 - 0.2 %  Differential  Result Value Ref Range   Neutrophils Relative % 58 %   Neutro Abs 5.2 1.7 - 7.7 K/uL   Lymphocytes Relative 32 %   Lymphs Abs 2.8 0.7 - 4.0 K/uL   Monocytes Relative 8 %   Monocytes Absolute 0.7 0.1 - 1.0 K/uL   Eosinophils Relative 1 %   Eosinophils Absolute 0.1 0.0 - 0.5 K/uL   Basophils Relative 1 %   Basophils Absolute 0.1 0.0 - 0.1 K/uL   Immature Granulocytes 0 %   Abs Immature Granulocytes 0.03 0.00 - 0.07 K/uL  Comprehensive metabolic panel  Result Value Ref Range   Sodium 142 135 - 145 mmol/L   Potassium 3.4 (L) 3.5 - 5.1 mmol/L   Chloride 105 98 - 111 mmol/L   CO2 27 22 - 32 mmol/L   Glucose, Bld 143 (H) 70 - 99 mg/dL   BUN 14 8 - 23 mg/dL   Creatinine, Ser 0.71 0.44 - 1.00 mg/dL   Calcium 9.7 8.9 - 10.3  mg/dL   Total Protein 8.3 (H) 6.5 - 8.1 g/dL   Albumin 4.2 3.5 - 5.0 g/dL   AST 30 15 - 41 U/L   ALT 17 0 -  44 U/L   Alkaline Phosphatase 81 38 - 126 U/L   Total Bilirubin 0.5 0.3 - 1.2 mg/dL   GFR, Estimated >60 >60 mL/min   Anion gap 10 5 - 15  Ethanol  Result Value Ref Range   Alcohol, Ethyl (B) <10 <10 mg/dL  Urine Drug Screen, Qualitative ([facility] only)  Result Value Ref Range   Tricyclic, Ur Screen NONE DETECTED NONE DETECTED   Amphetamines, Ur Screen NONE DETECTED NONE DETECTED   MDMA (Ecstasy)Ur Screen NONE DETECTED NONE DETECTED   Cocaine Metabolite,Ur [place] NONE DETECTED NONE DETECTED   Opiate, Ur Screen NONE DETECTED NONE DETECTED   Phencyclidine (PCP) Ur S NONE DETECTED NONE DETECTED   Cannabinoid 50 Ng, Ur [place] NONE DETECTED NONE DETECTED   Barbiturates, Ur Screen NONE DETECTED NONE DETECTED   Benzodiazepine, Ur Scrn NONE DETECTED NONE DETECTED   Methadone Scn, Ur NONE DETECTED NONE DETECTED  Lipid panel  Result Value Ref Range   Cholesterol 240 (H) 0 - 200 mg/dL   Triglycerides 44 <150 mg/dL   HDL 61 >40 mg/dL   Total CHOL/HDL Ratio 3.9 RATIO   VLDL 9 0 - 40 mg/dL   LDL Cholesterol 170 (H) 0 - 99 mg/dL  Hemoglobin A1c  Result Value Ref Range   Hgb A1c MFr Bld 5.4 4.8 - 5.6 %   Mean Plasma Glucose 108 mg/dL  CBG monitoring, ED  Result Value Ref Range   Glucose-Capillary 137 (H) 70 - 99 mg/dL   Comment 1 Notify RN    Comment 2 Document in Chart   Troponin I (High Sensitivity)  Result Value Ref Range   Troponin I (High Sensitivity) 3 <18 ng/L  Troponin I (High Sensitivity)  Result Value Ref Range   Troponin I (High Sensitivity) 4 <18 ng/L    Assessment & Plan:   Problem List Items Addressed This Visit   None Visit Diagnoses     Need for influenza vaccination    -  Primary   Relevant Orders   Flu Vaccine QUAD High Dose(Fluad) (Completed)        No orders of the defined types were placed in this encounter.  Orders Placed This Encounter  Procedures    Flu Vaccine QUAD High Dose(Fluad)     ***Follow up plan: No follow-ups on file.  Ria Bush, MD

## 2022-11-10 ENCOUNTER — Encounter: Payer: Self-pay | Admitting: Family Medicine

## 2022-11-10 NOTE — Assessment & Plan Note (Addendum)
Recent hospital admission for stroke like symptoms that slowly resolved.  Neurology did not see pt.  Discharged with hospice evaluation however once hospice came to house they didn't feel she qualified for hospice care. They decline HH - she recently completed HHPT course earlier this year, has HEP that personal care services home health aide helps her do.  Husband notes mild R sided weakness and leaning still but significant improvement from prior. She remains off statin and aspirin due to dementia, although cholesterol levels were elevated (LDL 170). Will continue low dose eliquis 2.'5mg'$  BID.

## 2022-11-10 NOTE — Assessment & Plan Note (Signed)
S/p evaluation, deemed not eligible. [facility] no longer offering home palliative care services.

## 2022-11-10 NOTE — Assessment & Plan Note (Signed)
Due for repeat prolia after 11/23/2022 - asked to return for nurse visit for this.

## 2022-11-10 NOTE — Assessment & Plan Note (Addendum)
With sundowning. Continue namenda, as well as seroquel '50mg'$  nightly and lorazepam PRN, followed by neurology Dr Melrose Nakayama. She also continues sertraline '50mg'$  daily.

## 2022-11-16 NOTE — Telephone Encounter (Signed)
Hold pending call from prolia rep. Have questions about two different co pay amounts.

## 2022-11-23 NOTE — Telephone Encounter (Signed)
Prolia co pay should be $30 under medical benefits will call patient to schedule lab and nurse visit

## 2022-11-23 NOTE — Telephone Encounter (Signed)
Called lab and nurse visit scheduled patient aware of co pay

## 2022-11-25 ENCOUNTER — Emergency Department
Admission: EM | Admit: 2022-11-25 | Discharge: 2022-11-25 | Disposition: A | Discharge status: Home / Self Care | Payer: Medicare Other | Attending: Emergency Medicine | Admitting: Emergency Medicine

## 2022-11-25 ENCOUNTER — Telehealth: Payer: Self-pay

## 2022-11-25 ENCOUNTER — Emergency Department: Payer: Medicare Other

## 2022-11-25 ENCOUNTER — Other Ambulatory Visit (INDEPENDENT_AMBULATORY_CARE_PROVIDER_SITE_OTHER): Payer: Medicare Other

## 2022-11-25 ENCOUNTER — Encounter: Payer: Self-pay | Admitting: Emergency Medicine

## 2022-11-25 ENCOUNTER — Other Ambulatory Visit: Payer: Self-pay

## 2022-11-25 ENCOUNTER — Other Ambulatory Visit: Payer: Self-pay | Admitting: Family Medicine

## 2022-11-25 DIAGNOSIS — Z7901 Long term (current) use of anticoagulants: Secondary | ICD-10-CM | POA: Insufficient documentation

## 2022-11-25 DIAGNOSIS — M810 Age-related osteoporosis without current pathological fracture: Secondary | ICD-10-CM | POA: Diagnosis not present

## 2022-11-25 DIAGNOSIS — F0390 Unspecified dementia without behavioral disturbance: Secondary | ICD-10-CM | POA: Insufficient documentation

## 2022-11-25 DIAGNOSIS — R531 Weakness: Secondary | ICD-10-CM

## 2022-11-25 LAB — BASIC METABOLIC PANEL
BUN: 17 mg/dL (ref 6–23)
CO2: 31 mEq/L (ref 19–32)
Calcium: 10.6 mg/dL — ABNORMAL HIGH (ref 8.4–10.5)
Chloride: 100 mEq/L (ref 96–112)
Creatinine, Ser: 0.83 mg/dL (ref 0.40–1.20)
GFR: 67.25 mL/min (ref >60.00)
Glucose, Bld: 99 mg/dL (ref 70–99)
Potassium: 4.2 mEq/L (ref 3.5–5.1)
Sodium: 141 mEq/L (ref 135–145)

## 2022-11-25 LAB — CBC
HCT: 39.8 % (ref 36.0–46.0)
Hemoglobin: 12.1 g/dL (ref 12.0–15.0)
MCH: 27.3 pg (ref 26.0–34.0)
MCHC: 30.4 g/dL (ref 30.0–36.0)
MCV: 89.8 fL (ref 80.0–100.0)
Platelets: 448 10*3/uL — ABNORMAL HIGH (ref 150–400)
RBC: 4.43 MIL/uL (ref 3.87–5.11)
RDW: 13.9 % (ref 11.5–15.5)
WBC: 12.8 10*3/uL — ABNORMAL HIGH (ref 4.0–10.5)
nRBC: 0 % (ref 0.0–0.2)

## 2022-11-25 LAB — VITAMIN D 25 HYDROXY (VIT D DEFICIENCY, FRACTURES): VITD: 70.18 ng/mL (ref 30.00–100.00)

## 2022-11-25 LAB — COMPREHENSIVE METABOLIC PANEL
ALT: 17 U/L (ref 0–44)
AST: 25 U/L (ref 15–41)
Albumin: 4.1 g/dL (ref 3.5–5.0)
Alkaline Phosphatase: 74 U/L (ref 38–126)
Anion gap: 8 (ref 5–15)
BUN: 18 mg/dL (ref 8–23)
CO2: 30 mmol/L (ref 22–32)
Calcium: 10.1 mg/dL (ref 8.9–10.3)
Chloride: 103 mmol/L (ref 98–111)
Creatinine, Ser: 0.82 mg/dL (ref 0.44–1.00)
GFR, Estimated: >60 mL/min (ref >60)
Glucose, Bld: 111 mg/dL — ABNORMAL HIGH (ref 70–99)
Potassium: 4 mmol/L (ref 3.5–5.1)
Sodium: 141 mmol/L (ref 135–145)
Total Bilirubin: 0.8 mg/dL (ref 0.3–1.2)
Total Protein: 8.1 g/dL (ref 6.5–8.1)

## 2022-11-25 LAB — URINALYSIS, ROUTINE W REFLEX MICROSCOPIC
Bilirubin Urine: NEGATIVE
Glucose, UA: NEGATIVE mg/dL
Hgb urine dipstick: NEGATIVE
Ketones, ur: NEGATIVE mg/dL
Leukocytes,Ua: NEGATIVE
Nitrite: NEGATIVE
Protein, ur: NEGATIVE mg/dL
Specific Gravity, Urine: 1.008 (ref 1.005–1.030)
pH: 6 (ref 5.0–8.0)

## 2022-11-25 LAB — PROTIME-INR
INR: 1 (ref 0.8–1.2)
Prothrombin Time: 13.3 seconds (ref 11.4–15.2)

## 2022-11-25 LAB — DIFFERENTIAL
Abs Immature Granulocytes: 0.05 10*3/uL (ref 0.00–0.07)
Basophils Absolute: 0.1 10*3/uL (ref 0.0–0.1)
Basophils Relative: 1 %
Eosinophils Absolute: 0 10*3/uL (ref 0.0–0.5)
Eosinophils Relative: 0 %
Immature Granulocytes: 0 %
Lymphocytes Relative: 13 %
Lymphs Abs: 1.6 10*3/uL (ref 0.7–4.0)
Monocytes Absolute: 0.7 10*3/uL (ref 0.1–1.0)
Monocytes Relative: 5 %
Neutro Abs: 10.3 10*3/uL — ABNORMAL HIGH (ref 1.7–7.7)
Neutrophils Relative %: 81 %

## 2022-11-25 LAB — CBG MONITORING, ED: Glucose-Capillary: 96 mg/dL (ref 70–99)

## 2022-11-25 LAB — APTT: aPTT: 30 seconds (ref 24–36)

## 2022-11-25 MED ORDER — LACTATED RINGERS IV BOLUS
1000.0000 mL | Freq: Once | INTRAVENOUS | Status: AC
  Administered 2022-11-25: 1000 mL via INTRAVENOUS

## 2022-11-25 NOTE — ED Triage Notes (Signed)
Patient arrives in wheelchair with husband stating patient has been leaning to the right and when walking falling over to the right side. Husband states a couple weeks ago he brought pt for same complaints. Husband states he woke pt up at 8am and noticed symptoms. Patient has equal grip strength and no other neuro deficits noted in triage. Pt hx of dementia.

## 2022-11-25 NOTE — Telephone Encounter (Signed)
Agree. Thanks

## 2022-11-25 NOTE — Telephone Encounter (Signed)
Pt in office for lab appointment.  [name], spouse with pt and reports that pt's weakness and leaning to the right side worse today.  He also reports that he had to wake pt up this morning for the appointment.  Assisted pt to exam room, ambulation slow with dragging of right foot.  BP 120/78, weakness noted to bilateral hands, symmetry in smile.  Spoke with Dr. Damita Dunnings who is covering for PCP Dr. Danise Mina, with the symptoms advised pt to do to ED to be evaluated.  Spouse driving pt via [facility].  Called and gave report to [place] in [facility] ED.

## 2022-11-25 NOTE — Discharge Instructions (Signed)
Your CAT scan did not show any new abnormality.  Your blood work and urine sample were all reassuring.  If you develop any new neurologic symptoms please return to the emergency department.

## 2022-11-25 NOTE — ED Provider Notes (Signed)
[facility] Provider Note    Event Date/Time   First MD Initiated Contact with Patient 11/25/22 1139     (approximate)   History   No chief complaint on file.   HPI  ANNSLEIGH Davies is a 81 y.o. female past medical history of DVT, dementia, hyperlipidemia, osteoporosis who presents because she has been leaning to the right.  Patient is accompanied by her husband who provides all of the history.  He notes that this morning when he went to get her up she seemed to be slumped to the right.  She was not really able to get up and walk however this is her baseline.  He denies any facial droop difficulty speaking or otherwise change in mental status.  Has not had any recent falls.  He tested her grip strength and it was symmetric bilaterally.  Patient was seen last month for similar.  Had MRI brain that was negative for acute stroke and was admitted but ultimately patient decided she wanted to be hospice.  However apparently when hospice came to evaluate her they decided she was not eligible so she is not getting hospice care currently.  Her husband tells me that since the hospitalization she is intermittently leaning to the right but seems to be worse today.  He was told not to put her on aspirin because she is on Eliquis.  Has since followed up with primary care.  She also sees Dr. Melrose Nakayama with neurology, and saw him on 12/18 which was after her hospitalization as well.     Past Medical History:  Diagnosis Date   Acute deep vein thrombosis (DVT) of distal end of left lower extremity ([place]) 10/04/2016   Acute DVT of the left gastrocnemius veins. Acute SVT of the left lesser saphenous vein, beginning approximately 0.4- 0.5cm from its take-off from the popliteal vein. (09/2016)   Dementia ([place])    DVT (deep venous thrombosis) ([place]) 09/2016   s/p 3 mo xarelto   Hyperlipidemia 05/2003   Osteoporosis 2008   took actonel for 5 yrs   Rectal bleeding 10/22/2016   xarelto  related.     Patient Active Problem List   Diagnosis Date Noted   Right leg weakness 11/02/2022   TIA (transient ischemic attack) 11/01/2022   Open wound of chin 06/03/2022   Fall with injury 01/06/2022   Acute cystitis with hematuria 12/25/2021   Chronic anticoagulation 07/30/2021   Sundowning 12/30/2020   Recurrent acute deep vein thrombosis (DVT) of left lower extremity ([place]) 11/07/2018   Alzheimer's dementia ([place]) 04/18/2018   Advanced care planning/counseling discussion 04/20/2017   Abnormal TSH 04/01/2016   Medicare annual wellness visit, subsequent 06/26/2012   Vitamin D deficiency 06/18/2012   Vitamin B12 deficiency 06/18/2012   Anxiety state 05/14/2009   Diverticulosis of colon 05/14/2009   CARCINOMA, BASAL CELL, NOSE 04/24/2007   HLD (hyperlipidemia) 04/24/2007   VARICOSE VEINS, LOWER EXTREMITIES 04/24/2007   Osteoporosis 04/24/2007   Anemia 02/03/2004     Physical Exam  Triage Vital Signs: ED Triage Vitals [11/25/22 1032]  Enc Vitals Group     BP 112/70     Pulse Rate 85     Resp 20     Temp 97.9 F (36.6 C)     Temp Source Oral     SpO2 95 %     Weight      Height      Head Circumference      Peak Flow  Pain Score      Pain Loc      Pain Edu?      Excl. in [place]?     Most recent vital signs: Vitals:   11/25/22 1032  BP: 112/70  Pulse: 85  Resp: 20  Temp: 97.9 F (36.6 C)  SpO2: 95%     General: Awake, no distress.  CV:  Good peripheral perfusion.  Resp:  Normal effort.  Abd:  No distention.  Neuro:             Awake, Alert, Oriented x 2 Other:  Patient has a flat affect Patient is sitting back in the stretcher and does lean slightly to the right PERRLA, EOMI, face symmetric  5/5 strength with grip bilateral upper extremities symmetric strength with elbow flexion extension bilateral upper extremities 5/5 strength in bilateral lower extremities Patient unable to follow commands for finger-nose-finger No neglect When patient asked to  sit up in bed she has to hold on to the stretcher and preferentially hold onto the right side, no clear truncal ataxia Gait testing deferred as patient has been tells me she is not able to ambulate on her own     ED Results / Procedures / Treatments  Labs (all labs ordered are listed, but only abnormal results are displayed) Labs Reviewed  CBC - Abnormal; Notable for the following components:      Result Value   WBC 12.8 (*)    Platelets 448 (*)    All other components within normal limits  DIFFERENTIAL - Abnormal; Notable for the following components:   Neutro Abs 10.3 (*)    All other components within normal limits  COMPREHENSIVE METABOLIC PANEL - Abnormal; Notable for the following components:   Glucose, Bld 111 (*)    All other components within normal limits  URINALYSIS, ROUTINE W REFLEX MICROSCOPIC - Abnormal; Notable for the following components:   Color, Urine STRAW (*)    APPearance CLEAR (*)    All other components within normal limits  PROTIME-INR  APTT  CBG MONITORING, ED  CBG MONITORING, ED     EKG  EKG reviewed interpreted myself shows sinus rhythm normal axis normal intervals inferior Q waves   RADIOLOGY I reviewed and interpreted the CT scan of the brain which does not show any acute intracranial process    PROCEDURES:  Critical Care performed: No  Procedures  MEDICATIONS ORDERED IN ED: Medications  lactated ringers bolus 1,000 mL (1,000 mLs Intravenous New Bag/Given 11/25/22 1214)     IMPRESSION / MDM / [place] / ED COURSE  I reviewed the triage vital signs and the nursing notes.                              Patient's presentation is most consistent with acute complicated illness / injury requiring diagnostic workup.  Differential diagnosis includes, but is not limited to, CVA, recrudescence, intracranial hemorrhage, metabolic abnormality, infectious process  Patient is a 82 year old female with underlying severe dementia  who presents because of leaning towards the right.  This started about 2 weeks ago when she was initially hospitalized and had an MRI of her brain that was normal.  Initially was going to be seen by neurology but she was determined to be hospice and was discharged.  However she seemed to improve and when hospice came out to evaluate her they deemed her not eligible.  Patient's husband tells me that he  does want her to be treated fully but is trying to keep her at home.  She has since followed up with her primary care and her neurologist Dr. Melrose Nakayama.  She is currently on Eliquis at a low dose.  Patient's husband tells me that she has intermittently lean towards the right since the hospitalization but it seemed to be worse this morning.  She has no other acute neurologic symptoms that he has noticed including no difficulty with speech, facial droop or change in mental status.  He is denying any other symptoms including fevers nausea vomiting or shortness of breath.  Patient is not able to provide any history because of her dementia.  On exam she does seem to lie towards the right when she is in the bed but her strength appears to be symmetric in the bilateral upper extremities I am unable to test finger-nose-finger because she does not understand what I am asking her to do.  Tried to test for truncal ataxia but patient is really not able to hold her weight up in bed anyway and has to hold onto the stretcher.  Did not test gait because apparently she is not able to walk on her own and patient's husband has to essentially support her and she is not able to use a walker either.  Ultimately it does not sound like she had an acute neurologic event today.  Unclear whether she had a stroke 2 weeks ago as her MRI was negative.  I suppose she may have had a posterior stroke that was not yet seen on MRI.  She is on Eliquis already and both her primary care doctor and neurologist have not recommended aspirin at this time so I  we will hold off and allow them to make any further determination of whether she should be on an aspirin.  If she is going to be treated maximally I think she would ideally need to have vessel imaging and an echo but because this neurologic event I think happened 2 weeks ago I do not think that it is urgent that we do this today in the emergency department.  Will check for UTI and other things such as metabolic abnormalities that could be causing some recrudescence.  Patient's husband's worried that she is not drinking much so we will give a small fluid bolus and check urinalysis.  Ultimately patient's husband was comfortable with having her follow-up with her neurologist    Patient's urinalysis is clean.  Again discussed with patient's husband about need to follow-up with neurology and primary care.  He agrees with avoiding admission at this time.  Discussed return for any new neurologic symptoms such as facial droop or decreased grip strength or difficulty with speech   FINAL CLINICAL IMPRESSION(S) / ED DIAGNOSES   Final diagnoses:  None     Rx / DC Orders   ED Discharge Orders     None        Note:  This document was prepared using Dragon voice recognition software and may include unintentional dictation errors.   Rada Hay, MD 11/25/22 [phone]

## 2022-11-26 ENCOUNTER — Telehealth: Payer: Self-pay

## 2022-11-26 NOTE — Telephone Encounter (Signed)
Transition Care Management Unsuccessful Follow-up Telephone Call  Date of discharge and from where:  11/25/2022 [facility] ED  Attempts:  1st Attempt  Reason for unsuccessful TCM follow-up call:  Left voice message

## 2022-12-02 ENCOUNTER — Ambulatory Visit (INDEPENDENT_AMBULATORY_CARE_PROVIDER_SITE_OTHER): Payer: Medicare Other

## 2022-12-02 DIAGNOSIS — M810 Age-related osteoporosis without current pathological fracture: Secondary | ICD-10-CM

## 2022-12-02 MED ORDER — DENOSUMAB 60 MG/ML ~~LOC~~ SOSY
60.0000 mg | PREFILLED_SYRINGE | Freq: Once | SUBCUTANEOUS | Status: AC
  Administered 2022-12-02: 60 mg via SUBCUTANEOUS

## 2022-12-02 NOTE — Progress Notes (Signed)
Per orders of Dr. Glori Bickers, injection of Prolia given by Brenton Grills. Patient tolerated injection well.

## 2022-12-07 ENCOUNTER — Ambulatory Visit (INDEPENDENT_AMBULATORY_CARE_PROVIDER_SITE_OTHER): Payer: Medicare Other | Admitting: Family Medicine

## 2022-12-07 ENCOUNTER — Encounter: Payer: Self-pay | Admitting: Family Medicine

## 2022-12-07 VITALS — BP 120/80 | HR 72 | Temp 97.0°F | Ht 61.0 in | Wt 123.5 lb

## 2022-12-07 DIAGNOSIS — R293 Abnormal posture: Secondary | ICD-10-CM

## 2022-12-07 DIAGNOSIS — G309 Alzheimer's disease, unspecified: Secondary | ICD-10-CM | POA: Diagnosis not present

## 2022-12-07 DIAGNOSIS — F02B18 Dementia in other diseases classified elsewhere, moderate, with other behavioral disturbance: Secondary | ICD-10-CM | POA: Diagnosis not present

## 2022-12-07 DIAGNOSIS — R0989 Other specified symptoms and signs involving the circulatory and respiratory systems: Secondary | ICD-10-CM

## 2022-12-07 NOTE — Progress Notes (Unsigned)
Patient ID: Kimberly Davies, female    DOB: May 23, 1941, 82 y.o.   MRN: [id number]  This visit was conducted in person.  BP 120/80 (BP Location: Left Arm, Patient Position: Sitting)   Pulse 72   Temp (!) 97 F (36.1 C) (Skin)   Ht '5\' 1"'$  (1.549 m)   Wt 123 lb 8 oz (56 kg)   BMI 23.34 kg/m    CC: ER f/u visit  Subjective:   HPI: Kimberly Davies is a 82 y.o. female presenting on 12/07/2022 for Follow-up (From ER)   See prior note for details. Seen again at ER on 11/25/2022 with ongoing leaning towards R side. Workup reassuring including CT head, UA, labwork.   Husband notes fluctuating levels of neurologic impairment that then resolve.   Regularly sees Dr Britt Bottom neurology, on namenda '10mg'$  twice daily, seroquel '25mg'$  nightly, sertraline '50mg'$  daily. Using lorazepam 0.'5mg'$  sparingly.   They've hired personal care services for 12 hours/wk, not so during these holiday weeks.      Relevant past medical, surgical, family and social history reviewed and updated as indicated. Interim medical history since our last visit reviewed. Allergies and medications reviewed and updated. Outpatient Medications Prior to Visit  Medication Sig Dispense Refill   apixaban (ELIQUIS) 2.5 MG TABS tablet Take 1 tablet (2.5 mg total) by mouth 2 (two) times daily. 60 tablet 5   Cholecalciferol (D3-50) 1.25 MG (50000 UT) capsule TAKE 1 CAPSULE BY MOUTH ONE TIME PER WEEK 12 capsule 2   denosumab (PROLIA) 60 MG/ML SOSY injection Inject 60 mg into the skin every 6 (six) months.     LORazepam (ATIVAN) 0.5 MG tablet Take 0.5 mg by mouth daily as needed.     memantine (NAMENDA) 10 MG tablet TAKE 1 TABLET BY MOUTH TWICE A DAY 180 tablet 1   sertraline (ZOLOFT) 50 MG tablet TAKE 1 TABLET BY MOUTH EVERY DAY 90 tablet 0   vitamin B-12 (CYANOCOBALAMIN) 1000 MCG tablet Take 1 tablet (1,000 mcg total) by mouth every Monday, Wednesday, and Friday.     QUEtiapine (SEROQUEL) 25 MG tablet Take 50 mg by mouth at bedtime.      QUEtiapine (SEROQUEL) 25 MG tablet Take 1 tablet (25 mg total) by mouth at bedtime.     No facility-administered medications prior to visit.     Per HPI unless specifically indicated in ROS section below Review of Systems  Objective:  BP 120/80 (BP Location: Left Arm, Patient Position: Sitting)   Pulse 72   Temp (!) 97 F (36.1 C) (Skin)   Ht '5\' 1"'$  (1.549 m)   Wt 123 lb 8 oz (56 kg)   BMI 23.34 kg/m   Wt Readings from Last 3 Encounters:  12/07/22 123 lb 8 oz (56 kg)  11/25/22 121 lb 4.1 oz (55 kg)  11/09/22 120 lb 12.8 oz (54.8 kg)      Physical Exam Vitals and nursing note reviewed.  Constitutional:      Appearance: Normal appearance. She is not ill-appearing.  HENT:     Head: Normocephalic and atraumatic.     Mouth/Throat:     Mouth: Mucous membranes are moist.     Pharynx: Oropharynx is clear. No oropharyngeal exudate or posterior oropharyngeal erythema.  Eyes:     Extraocular Movements: Extraocular movements intact.     Conjunctiva/sclera: Conjunctivae normal.     Pupils: Pupils are equal, round, and reactive to light.  Neck:     Vascular: Carotid bruit (faint R  sided) present.  Cardiovascular:     Rate and Rhythm: Normal rate and regular rhythm.     Pulses: Normal pulses.     Heart sounds: Normal heart sounds. No murmur heard.    Comments: No murmur or irregular rate appreciated Pulmonary:     Effort: Pulmonary effort is normal. No respiratory distress.     Breath sounds: Normal breath sounds. No wheezing, rhonchi or rales.  Musculoskeletal:     Cervical back: Normal range of motion and neck supple.     Right lower leg: No edema.     Left lower leg: No edema.  Skin:    General: Skin is warm and dry.     Findings: No rash.  Neurological:     Mental Status: She is alert.     Motor: Motor function is intact.     Comments:  CN grossly intact, EOMI, moves face equally and symmetrically  Slowed unsteady gait  Psychiatric:        Mood and Affect: Mood  normal.        Behavior: Behavior normal.       Results for orders placed or performed during the hospital encounter of 11/25/22  Protime-INR  Result Value Ref Range   Prothrombin Time 13.3 11.4 - 15.2 seconds   INR 1.0 0.8 - 1.2  APTT  Result Value Ref Range   aPTT 30 24 - 36 seconds  CBC  Result Value Ref Range   WBC 12.8 (H) 4.0 - 10.5 K/uL   RBC 4.43 3.87 - 5.11 MIL/uL   Hemoglobin 12.1 12.0 - 15.0 g/dL   HCT 39.8 36.0 - 46.0 %   MCV 89.8 80.0 - 100.0 fL   MCH 27.3 26.0 - 34.0 pg   MCHC 30.4 30.0 - 36.0 g/dL   RDW 13.9 11.5 - 15.5 %   Platelets 448 (H) 150 - 400 K/uL   nRBC 0.0 0.0 - 0.2 %  Differential  Result Value Ref Range   Neutrophils Relative % 81 %   Neutro Abs 10.3 (H) 1.7 - 7.7 K/uL   Lymphocytes Relative 13 %   Lymphs Abs 1.6 0.7 - 4.0 K/uL   Monocytes Relative 5 %   Monocytes Absolute 0.7 0.1 - 1.0 K/uL   Eosinophils Relative 0 %   Eosinophils Absolute 0.0 0.0 - 0.5 K/uL   Basophils Relative 1 %   Basophils Absolute 0.1 0.0 - 0.1 K/uL   Immature Granulocytes 0 %   Abs Immature Granulocytes 0.05 0.00 - 0.07 K/uL  Comprehensive metabolic panel  Result Value Ref Range   Sodium 141 135 - 145 mmol/L   Potassium 4.0 3.5 - 5.1 mmol/L   Chloride 103 98 - 111 mmol/L   CO2 30 22 - 32 mmol/L   Glucose, Bld 111 (H) 70 - 99 mg/dL   BUN 18 8 - 23 mg/dL   Creatinine, Ser 0.82 0.44 - 1.00 mg/dL   Calcium 10.1 8.9 - 10.3 mg/dL   Total Protein 8.1 6.5 - 8.1 g/dL   Albumin 4.1 3.5 - 5.0 g/dL   AST 25 15 - 41 U/L   ALT 17 0 - 44 U/L   Alkaline Phosphatase 74 38 - 126 U/L   Total Bilirubin 0.8 0.3 - 1.2 mg/dL   GFR, Estimated >60 >60 mL/min   Anion gap 8 5 - 15  Urinalysis, Routine w reflex microscopic Urine, Clean Catch  Result Value Ref Range   Color, Urine STRAW (A) YELLOW   APPearance CLEAR (A)  CLEAR   Specific Gravity, Urine 1.008 1.005 - 1.030   pH 6.0 5.0 - 8.0   Glucose, UA NEGATIVE NEGATIVE mg/dL   Hgb urine dipstick NEGATIVE NEGATIVE   Bilirubin  Urine NEGATIVE NEGATIVE   Ketones, ur NEGATIVE NEGATIVE mg/dL   Protein, ur NEGATIVE NEGATIVE mg/dL   Nitrite NEGATIVE NEGATIVE   Leukocytes,Ua NEGATIVE NEGATIVE  CBG monitoring, ED  Result Value Ref Range   Glucose-Capillary 96 70 - 99 mg/dL    Assessment & Plan:   Problem List Items Addressed This Visit   None Visit Diagnoses     Right carotid bruit    -  Primary   Relevant Orders   VAS US CAROTID        No orders of the defined types were placed in this encounter.  No orders of the defined types were placed in this encounter.   Patient Instructions  Good to see you today I'd like to get carotid ultrasound ordered - we will call you to get this scheduled. Let me know if you haven't heard by 2 weeks.  Continue current medicines.   Follow up plan: Return if symptoms worsen or fail to improve.  Ria Bush, MD

## 2022-12-07 NOTE — Patient Instructions (Addendum)
Good to see you today I'd like to get carotid ultrasound ordered - we will call you to get this scheduled. Let me know if you haven't heard by 2 weeks.  Continue current medicines.

## 2022-12-08 DIAGNOSIS — R293 Abnormal posture: Secondary | ICD-10-CM | POA: Insufficient documentation

## 2022-12-08 NOTE — Assessment & Plan Note (Signed)
Mild bruit heard today - will update carotid US

## 2022-12-08 NOTE — Assessment & Plan Note (Signed)
Intermittent rightward lean, given fluctuation in symptoms doubt stroke related but possibly more related to dementia. Discussed this. Reviewed prior CT and MRI as well as latest head CT overall reassuring. She did not undergo further stroke work up during previous observation hospitalization 10/2022. Will proceed with carotid US given bruit heard.

## 2022-12-08 NOTE — Assessment & Plan Note (Addendum)
On namenda with seroquel '25mg'$  nightly, sees neurology.  Recent symptoms possibly due to dementia not stroke.  Will continue to monitor.  They have hired personal care services 4 hours/day 3d/wk

## 2022-12-13 ENCOUNTER — Ambulatory Visit (HOSPITAL_COMMUNITY)
Admission: RE | Admit: 2022-12-13 | Discharge: 2022-12-13 | Disposition: A | Discharge status: Home / Self Care | Payer: Medicare Other | Source: Ambulatory Visit | Attending: Cardiology | Admitting: Cardiology

## 2022-12-13 DIAGNOSIS — R0989 Other specified symptoms and signs involving the circulatory and respiratory systems: Secondary | ICD-10-CM | POA: Diagnosis not present

## 2022-12-16 ENCOUNTER — Other Ambulatory Visit (HOSPITAL_COMMUNITY): Payer: Self-pay

## 2022-12-16 ENCOUNTER — Encounter: Payer: Self-pay | Admitting: Family Medicine

## 2022-12-16 NOTE — Telephone Encounter (Signed)
Submitted benefit verificaiton for Prolia via [employer] portal.   Patient Teacher, English as a foreign language completed.    The current 180 day co-pay is, $375.00.   The patient is insured through [employer]

## 2023-01-18 NOTE — Telephone Encounter (Signed)
Patient last had her Prolia injection on 12/02/2022.  No inj due at this time.

## 2023-02-03 ENCOUNTER — Inpatient Hospital Stay: Payer: Medicare Other | Attending: Oncology

## 2023-02-03 ENCOUNTER — Inpatient Hospital Stay: Payer: Medicare Other | Admitting: Oncology

## 2023-02-03 ENCOUNTER — Encounter: Payer: Self-pay | Admitting: Oncology

## 2023-02-03 VITALS — BP 119/58 | HR 74 | Temp 97.1°F | Resp 18 | Wt 123.6 lb

## 2023-02-03 DIAGNOSIS — Z880 Allergy status to penicillin: Secondary | ICD-10-CM | POA: Insufficient documentation

## 2023-02-03 DIAGNOSIS — Z888 Allergy status to other drugs, medicaments and biological substances status: Secondary | ICD-10-CM | POA: Insufficient documentation

## 2023-02-03 DIAGNOSIS — F0390 Unspecified dementia without behavioral disturbance: Secondary | ICD-10-CM | POA: Insufficient documentation

## 2023-02-03 DIAGNOSIS — I82402 Acute embolism and thrombosis of unspecified deep veins of left lower extremity: Secondary | ICD-10-CM

## 2023-02-03 DIAGNOSIS — Z800 Family history of malignant neoplasm of digestive organs: Secondary | ICD-10-CM | POA: Diagnosis not present

## 2023-02-03 DIAGNOSIS — Z818 Family history of other mental and behavioral disorders: Secondary | ICD-10-CM | POA: Diagnosis not present

## 2023-02-03 DIAGNOSIS — Z808 Family history of malignant neoplasm of other organs or systems: Secondary | ICD-10-CM | POA: Diagnosis not present

## 2023-02-03 DIAGNOSIS — Z79899 Other long term (current) drug therapy: Secondary | ICD-10-CM | POA: Diagnosis not present

## 2023-02-03 DIAGNOSIS — Z9049 Acquired absence of other specified parts of digestive tract: Secondary | ICD-10-CM | POA: Insufficient documentation

## 2023-02-03 DIAGNOSIS — Z811 Family history of alcohol abuse and dependence: Secondary | ICD-10-CM | POA: Diagnosis not present

## 2023-02-03 DIAGNOSIS — Z833 Family history of diabetes mellitus: Secondary | ICD-10-CM | POA: Insufficient documentation

## 2023-02-03 DIAGNOSIS — Z86718 Personal history of other venous thrombosis and embolism: Secondary | ICD-10-CM | POA: Diagnosis present

## 2023-02-03 DIAGNOSIS — E785 Hyperlipidemia, unspecified: Secondary | ICD-10-CM | POA: Insufficient documentation

## 2023-02-03 DIAGNOSIS — Z801 Family history of malignant neoplasm of trachea, bronchus and lung: Secondary | ICD-10-CM | POA: Insufficient documentation

## 2023-02-03 DIAGNOSIS — M79662 Pain in left lower leg: Secondary | ICD-10-CM | POA: Insufficient documentation

## 2023-02-03 DIAGNOSIS — Z8249 Family history of ischemic heart disease and other diseases of the circulatory system: Secondary | ICD-10-CM | POA: Insufficient documentation

## 2023-02-03 DIAGNOSIS — Z8052 Family history of malignant neoplasm of bladder: Secondary | ICD-10-CM | POA: Insufficient documentation

## 2023-02-03 DIAGNOSIS — Z885 Allergy status to narcotic agent status: Secondary | ICD-10-CM | POA: Diagnosis not present

## 2023-02-03 DIAGNOSIS — I82409 Acute embolism and thrombosis of unspecified deep veins of unspecified lower extremity: Secondary | ICD-10-CM | POA: Diagnosis not present

## 2023-02-03 DIAGNOSIS — Z823 Family history of stroke: Secondary | ICD-10-CM | POA: Diagnosis not present

## 2023-02-03 DIAGNOSIS — Z7901 Long term (current) use of anticoagulants: Secondary | ICD-10-CM | POA: Diagnosis not present

## 2023-02-03 LAB — CBC WITH DIFFERENTIAL/PLATELET
Abs Immature Granulocytes: 0.02 10*3/uL (ref 0.00–0.07)
Basophils Absolute: 0.1 10*3/uL (ref 0.0–0.1)
Basophils Relative: 1 %
Eosinophils Absolute: 0.1 10*3/uL (ref 0.0–0.5)
Eosinophils Relative: 2 %
HCT: 40.8 % (ref 36.0–46.0)
Hemoglobin: 12.6 g/dL (ref 12.0–15.0)
Immature Granulocytes: 0 %
Lymphocytes Relative: 37 %
Lymphs Abs: 2.6 10*3/uL (ref 0.7–4.0)
MCH: 28.1 pg (ref 26.0–34.0)
MCHC: 30.9 g/dL (ref 30.0–36.0)
MCV: 90.9 fL (ref 80.0–100.0)
Monocytes Absolute: 0.7 10*3/uL (ref 0.1–1.0)
Monocytes Relative: 10 %
Neutro Abs: 3.6 10*3/uL (ref 1.7–7.7)
Neutrophils Relative %: 50 %
Platelets: 434 10*3/uL — ABNORMAL HIGH (ref 150–400)
RBC: 4.49 MIL/uL (ref 3.87–5.11)
RDW: 13.3 % (ref 11.5–15.5)
WBC: 7.2 10*3/uL (ref 4.0–10.5)
nRBC: 0 % (ref 0.0–0.2)

## 2023-02-03 LAB — COMPREHENSIVE METABOLIC PANEL
ALT: 14 U/L (ref 0–44)
AST: 21 U/L (ref 15–41)
Albumin: 4.1 g/dL (ref 3.5–5.0)
Alkaline Phosphatase: 77 U/L (ref 38–126)
Anion gap: 10 (ref 5–15)
BUN: 18 mg/dL (ref 8–23)
CO2: 29 mmol/L (ref 22–32)
Calcium: 9.5 mg/dL (ref 8.9–10.3)
Chloride: 100 mmol/L (ref 98–111)
Creatinine, Ser: 0.97 mg/dL (ref 0.44–1.00)
GFR, Estimated: 59 mL/min — ABNORMAL LOW (ref >60)
Glucose, Bld: 82 mg/dL (ref 70–99)
Potassium: 3.8 mmol/L (ref 3.5–5.1)
Sodium: 139 mmol/L (ref 135–145)
Total Bilirubin: 0.4 mg/dL (ref 0.3–1.2)
Total Protein: 8.3 g/dL — ABNORMAL HIGH (ref 6.5–8.1)

## 2023-02-03 MED ORDER — APIXABAN 2.5 MG PO TABS: 2.5000 mg | ORAL_TABLET | Freq: Two times a day (BID) | ORAL | 5 refills | Status: DC
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
## 2023-02-03 NOTE — Assessment & Plan Note (Signed)
#  History of recurrent acute lower extremity DVT, Overall she tolerates.  Patient is at high risk of recurrent thrombosis as well as bleeding risk due to fall risk After weighing benefits and the risks, shared decision was made to continue prophylactic Eliquis 2.5 mg twice daily.

## 2023-02-14 ENCOUNTER — Observation Stay
Admission: EM | Admit: 2023-02-14 | Discharge: 2023-02-16 | Disposition: A | Discharge status: Home / Self Care | Payer: Medicare Other | Attending: Family Medicine | Admitting: Family Medicine

## 2023-02-14 ENCOUNTER — Other Ambulatory Visit: Payer: Self-pay

## 2023-02-14 ENCOUNTER — Encounter: Payer: Self-pay | Admitting: Internal Medicine

## 2023-02-14 ENCOUNTER — Telehealth: Payer: Self-pay | Admitting: Family Medicine

## 2023-02-14 DIAGNOSIS — K922 Gastrointestinal hemorrhage, unspecified: Secondary | ICD-10-CM | POA: Diagnosis not present

## 2023-02-14 DIAGNOSIS — F02B18 Dementia in other diseases classified elsewhere, moderate, with other behavioral disturbance: Secondary | ICD-10-CM

## 2023-02-14 DIAGNOSIS — Z86718 Personal history of other venous thrombosis and embolism: Secondary | ICD-10-CM | POA: Insufficient documentation

## 2023-02-14 DIAGNOSIS — G309 Alzheimer's disease, unspecified: Secondary | ICD-10-CM

## 2023-02-14 DIAGNOSIS — Z8673 Personal history of transient ischemic attack (TIA), and cerebral infarction without residual deficits: Secondary | ICD-10-CM | POA: Insufficient documentation

## 2023-02-14 DIAGNOSIS — K640 First degree hemorrhoids: Secondary | ICD-10-CM

## 2023-02-14 DIAGNOSIS — Z7901 Long term (current) use of anticoagulants: Secondary | ICD-10-CM | POA: Diagnosis not present

## 2023-02-14 DIAGNOSIS — F0280 Dementia in other diseases classified elsewhere without behavioral disturbance: Secondary | ICD-10-CM | POA: Diagnosis not present

## 2023-02-14 DIAGNOSIS — K625 Hemorrhage of anus and rectum: Secondary | ICD-10-CM | POA: Diagnosis present

## 2023-02-14 DIAGNOSIS — I82409 Acute embolism and thrombosis of unspecified deep veins of unspecified lower extremity: Secondary | ICD-10-CM | POA: Diagnosis present

## 2023-02-14 LAB — CBC
HCT: 39.9 % (ref 36.0–46.0)
HCT: 36.8 % (ref 36.0–46.0)
Hemoglobin: 12.3 g/dL (ref 12.0–15.0)
Hemoglobin: 11.4 g/dL — ABNORMAL LOW (ref 12.0–15.0)
MCH: 27.9 pg (ref 26.0–34.0)
MCH: 27.9 pg (ref 26.0–34.0)
MCHC: 30.8 g/dL (ref 30.0–36.0)
MCHC: 31 g/dL (ref 30.0–36.0)
MCV: 90.5 fL (ref 80.0–100.0)
MCV: 90 fL (ref 80.0–100.0)
Platelets: 421 10*3/uL — ABNORMAL HIGH (ref 150–400)
Platelets: 365 10*3/uL (ref 150–400)
RBC: 4.41 MIL/uL (ref 3.87–5.11)
RBC: 4.09 MIL/uL (ref 3.87–5.11)
RDW: 13.4 % (ref 11.5–15.5)
RDW: 13.3 % (ref 11.5–15.5)
WBC: 7.8 10*3/uL (ref 4.0–10.5)
WBC: 9.7 10*3/uL (ref 4.0–10.5)
nRBC: 0 % (ref 0.0–0.2)
nRBC: 0 % (ref 0.0–0.2)

## 2023-02-14 LAB — COMPREHENSIVE METABOLIC PANEL
ALT: 18 U/L (ref 0–44)
AST: 23 U/L (ref 15–41)
Albumin: 4 g/dL (ref 3.5–5.0)
Alkaline Phosphatase: 85 U/L (ref 38–126)
Anion gap: 7 (ref 5–15)
BUN: 20 mg/dL (ref 8–23)
CO2: 31 mmol/L (ref 22–32)
Calcium: 9.7 mg/dL (ref 8.9–10.3)
Chloride: 102 mmol/L (ref 98–111)
Creatinine, Ser: 0.95 mg/dL (ref 0.44–1.00)
GFR, Estimated: >60 mL/min (ref >60)
Glucose, Bld: 101 mg/dL — ABNORMAL HIGH (ref 70–99)
Potassium: 3.9 mmol/L (ref 3.5–5.1)
Sodium: 140 mmol/L (ref 135–145)
Total Bilirubin: 0.6 mg/dL (ref 0.3–1.2)
Total Protein: 8.3 g/dL — ABNORMAL HIGH (ref 6.5–8.1)

## 2023-02-14 LAB — TYPE AND SCREEN
ABO/RH(D): A POS
Antibody Screen: NEGATIVE

## 2023-02-14 LAB — PROTIME-INR
INR: 1 (ref 0.8–1.2)
Prothrombin Time: 13.1 seconds (ref 11.4–15.2)

## 2023-02-14 LAB — POC OCCULT BLOOD, ED: Occult Blood: POSITIVE

## 2023-02-14 MED ORDER — ACETAMINOPHEN 650 MG RE SUPP: 650.0000 mg | Freq: Four times a day (QID) | RECTAL | Status: DC | PRN

## 2023-02-14 MED ORDER — ONDANSETRON HCL 4 MG/2ML IJ SOLN: 4.0000 mg | Freq: Four times a day (QID) | INTRAMUSCULAR | Status: DC | PRN

## 2023-02-14 MED ORDER — ONDANSETRON HCL 4 MG PO TABS: 4.0000 mg | ORAL_TABLET | Freq: Four times a day (QID) | ORAL | Status: DC | PRN

## 2023-02-14 MED ORDER — QUETIAPINE FUMARATE 25 MG PO TABS
25.0000 mg | ORAL_TABLET | Freq: Every day | ORAL | Status: DC
  Administered 2023-02-15: 25 mg via ORAL
  Filled 2023-02-14: qty 1

## 2023-02-14 MED ORDER — MEMANTINE HCL 5 MG PO TABS
10.0000 mg | ORAL_TABLET | Freq: Two times a day (BID) | ORAL | Status: DC
  Administered 2023-02-14 – 2023-02-16 (×4): 10 mg via ORAL
  Filled 2023-02-14 (×4): qty 2

## 2023-02-14 MED ORDER — SERTRALINE HCL 50 MG PO TABS
50.0000 mg | ORAL_TABLET | Freq: Every day | ORAL | Status: DC
  Administered 2023-02-15 – 2023-02-16 (×2): 50 mg via ORAL
  Filled 2023-02-14 (×2): qty 1

## 2023-02-14 MED ORDER — ACETAMINOPHEN 325 MG PO TABS: 650.0000 mg | ORAL_TABLET | Freq: Four times a day (QID) | ORAL | Status: DC | PRN

## 2023-02-14 MED ORDER — SODIUM CHLORIDE 0.9% FLUSH
3.0000 mL | Freq: Two times a day (BID) | INTRAVENOUS | Status: DC
  Administered 2023-02-14 – 2023-02-16 (×4): 3 mL via INTRAVENOUS

## 2023-02-14 NOTE — Assessment & Plan Note (Addendum)
Given patient is not endorsing any abdominal pain, most likely diverticular in nature however differential also includes internal hemorrhoids.  Will hold home Eliquis and monitor serial CBC.  - Hold home Eliquis - CBC twice daily - If evidence of severe anemia or hemodynamic compromise should occur, will obtain CTA of the abdomen - Will need outpatient referral to GI

## 2023-02-14 NOTE — ED Provider Notes (Signed)
[facility] Provider Note    Event Date/Time   First MD Initiated Contact with Patient 02/14/23 1809     (approximate)   History   Rectal Bleeding   HPI  BRYANNAH GOGUE is a 82 y.o. female  with h/o DVT, HLD, dementia, here with rectal bleeding. History provided primarily by husband. Per report, she has had increasing rectal bleeding x 3 days. She has had a h/o hemorrhoids with sparse one or two episodes of bleeding previously but she is now passing blood spontaneously and with any BM or sitting. She has otherwise been well. Has not c/o of abdominal pain. No recent trauma. She is on anticaogulation for h/o dvt years ago.        Physical Exam   Triage Vital Signs: ED Triage Vitals  Enc Vitals Group     BP 02/14/23 1629 116/70     Pulse Rate 02/14/23 1629 82     Resp 02/14/23 1629 18     Temp 02/14/23 1629 97.6 F (36.4 C)     Temp Source 02/14/23 1629 Oral     SpO2 02/14/23 1629 95 %     Weight 02/14/23 1630 123 lb 7.3 oz (56 kg)     Height 02/14/23 1630 '5\' 1"'$  (1.549 m)     Head Circumference --      Peak Flow --      Pain Score 02/14/23 1630 0     Pain Loc --      Pain Edu? --      Excl. in [place]? --     Most recent vital signs: Vitals:   02/14/23 2100 02/14/23 2203  BP: (!) 103/59 118/69  Pulse:  68  Resp:  17  Temp:  98 F (36.7 C)  SpO2:  97%     General: Awake, no distress. CV:  Good peripheral perfusion.  Resp:  Normal work of breathing.  Abd:  No distention. No tenderness. Hemorrhoids noted circumferentially but no significant bleeding or thrombosis from hermorhoid. Mixed dark and light blood in rectal vault. Other:  No LE edema.    ED Results / Procedures / Treatments   Labs (all labs ordered are listed, but only abnormal results are displayed) Labs Reviewed  COMPREHENSIVE METABOLIC PANEL - Abnormal; Notable for the following components:      Result Value   Glucose, Bld 101 (*)    Total Protein 8.3 (*)    All other  components within normal limits  CBC - Abnormal; Notable for the following components:   Platelets 421 (*)    All other components within normal limits  CBC - Abnormal; Notable for the following components:   Hemoglobin 11.4 (*)    All other components within normal limits  POC OCCULT BLOOD, ED - Abnormal  PROTIME-INR  CBC  CBC  TYPE AND SCREEN     EKG    RADIOLOGY    I also independently reviewed and agree with radiologist interpretations.   PROCEDURES:  Critical Care performed: No  MEDICATIONS ORDERED IN ED: Medications  sodium chloride flush (NS) 0.9 % injection 3 mL (3 mLs Intravenous Given 02/14/23 2207)  acetaminophen (TYLENOL) tablet 650 mg (has no administration in time range)    Or  acetaminophen (TYLENOL) suppository 650 mg (has no administration in time range)  ondansetron (ZOFRAN) tablet 4 mg (has no administration in time range)    Or  ondansetron (ZOFRAN) injection 4 mg (has no administration in time range)  memantine (NAMENDA)  tablet 10 mg (10 mg Oral Given 02/14/23 2207)  QUEtiapine (SEROQUEL) tablet 25 mg (has no administration in time range)  sertraline (ZOLOFT) tablet 50 mg (has no administration in time range)     IMPRESSION / MDM / ASSESSMENT AND PLAN / ED COURSE  I reviewed the triage vital signs and the nursing notes.                              Differential diagnosis includes, but is not limited to, diverticular bleed, hemorrhoidal bleeding, AVM, UGIB  Patient's presentation is most consistent with acute presentation with potential threat to life or bodily function.  The patient is on the cardiac monitor to evaluate for evidence of arrhythmia and/or significant heart rate changes  82 yo F with PMHx DVT on Eliquis here with BRBPR. She is hemodynamically stable. CBC shows no leukocytosis or significant anemia. Hemoccult positive with gross blood on exam. CMP unremarkable with normal renal function, LFTs. Reviewed prior CT scans which do  show diverticulosis. Will admit given ongoing gi bleed on anticoagulation with diverticulosis.     FINAL CLINICAL IMPRESSION(S) / ED DIAGNOSES   Final diagnoses:  Lower GI bleed     Rx / DC Orders   ED Discharge Orders     None        Note:  This document was prepared using Dragon voice recognition software and may include unintentional dictation errors.   Duffy Bruce, MD 02/15/23 [phone]
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
## 2023-02-14 NOTE — Telephone Encounter (Signed)
PLEASE NOTE: All timestamps contained within this report are represented as [place] Standard Time. CONFIDENTIALTY NOTICE: This fax transmission is intended only for the addressee. It contains information that is legally privileged, confidential or otherwise protected from use or disclosure. If you are not the intended recipient, you are strictly prohibited from reviewing, disclosing, copying using or disseminating any of this information or taking any action in reliance on or regarding this information. If you have received this fax in error, please notify us immediately by telephone so that we can arrange for its return to [place]. Phone: [phone], Toll-Free: [phone], Fax: [phone] Page: 1 of 2 Call Id: [id number] [place] [facility] - Client TELEPHONE ADVICE RECORD AccessNurse Patient Name: Kimberly Davies [facility] Gender: Female DOB: 06/03/1941 Age: 82 Y 2 M 16 D Return Phone Number: [phone] (Primary), [phone] (Secondary) Address: [place]/ State/ Zip: [place] [address] Client [place] [facility] - Client Client Site [facility] Provider Ria Bush - MD Contact Type Call Who Is Calling Patient / Member / Family / Caregiver Call Type Triage / Clinical Caller Name Takendra Hugie Relationship To Patient Spouse Return Phone Number [phone] (Secondary) Chief Complaint Rectal Bleeding Reason for Call Symptomatic / Request for [place] stated that the pt is c/o hemorrhoids, bleeding into toilet for 3 days. Translation No Nurse Assessment Nurse: Cox, RN, [place] Date/Time (Eastern Time): 02/14/2023 3:23:35 PM Confirm and document reason for call. If symptomatic, describe symptoms. ---Caller stated that the patient is c/o hemorrhoids, bleeding into toilet for 3 days. Caller reports that starting on Saturday he noticed blood in the toilet after patient  had a bowel movement and when he is assisting cleaning her up the blood still keeps flowing and only seems to stop when he pulls her depends up. Reports that patient does take a blood thinner. Does the patient have any new or worsening symptoms? ---Yes Will a triage be completed? ---Yes Related visit to physician within the last 2 weeks? ---No Does the PT have any chronic conditions? (i.e. diabetes, asthma, this includes High risk factors for pregnancy, etc.) ---Yes List chronic conditions. ---Dementia and hx DVT Is this a behavioral health or substance abuse call? ---No Guidelines Guideline Title Affirmed Question Affirmed Notes Nurse Date/Time (Eastern Time) Rectal Bleeding [1] MODERATE rectal bleeding (small blood clots, passing blood without stool, or toilet water turns Cox, RN, [facility] 02/14/2023 3:26:29 PM PLEASE NOTE: All timestamps contained within this report are represented as [place] Standard Time. CONFIDENTIALTY NOTICE: This fax transmission is intended only for the addressee. It contains information that is legally privileged, confidential or otherwise protected from use or disclosure. If you are not the intended recipient, you are strictly prohibited from reviewing, disclosing, copying using or disseminating any of this information or taking any action in reliance on or regarding this information. If you have received this fax in error, please notify us immediately by telephone so that we can arrange for its return to [place]. Phone: [phone], Toll-Free: [phone], Fax: [phone] Page: 2 of 2 Call Id: [id number] Guidelines Guideline Title Affirmed Question Affirmed Notes Nurse Date/Time [name] Time) red) AND [2] more than once a day Disp. Time [name] Time) Disposition Final User 02/14/2023 3:30:00 PM Go to ED Now Yes Cox, RN, [facility] Final Disposition 02/14/2023 3:30:00 PM Go to ED Now Yes Cox, RN, [facility] Disagree/Comply Comply Caller Understands  Yes PreDisposition InappropriateToAsk Care Advice Given Per Guideline GO TO  ED NOW: * You need to be seen in the Emergency Department. * Go to the ED at ___________ [place] now. Drive carefully. CARE ADVICE given per Rectal Bleeding (Adult) guideline. Referrals [facility] - ED

## 2023-02-14 NOTE — Assessment & Plan Note (Signed)
Holding home Eliquis in the setting of diverticular bleed.  - Restart home Eliquis when able

## 2023-02-14 NOTE — Telephone Encounter (Addendum)
Pt's husband, Kimberly Davies, called stating the pt has Hemorrhoids & lately, when she uses the bathroom, the toilet is full with blood. Transferred pt & husband to access nurse. Call back # [phone]

## 2023-02-14 NOTE — Telephone Encounter (Signed)
Patient is currently at the ED. 

## 2023-02-14 NOTE — Assessment & Plan Note (Signed)
-   Continue home Seroquel, sertraline, memantine - Delirium precautions

## 2023-02-14 NOTE — ED Triage Notes (Signed)
Pt presents to the ED due to rectal bleeding that started 3 days. Husband states sometimes this happens and stops within a day. Husband denies NVD, weakness, and dizziness. Husbands it is bright red and everytime she uses the restroom. Pt is on Eliquis. Pt hx of dementia

## 2023-02-15 DIAGNOSIS — K640 First degree hemorrhoids: Secondary | ICD-10-CM | POA: Diagnosis not present

## 2023-02-15 DIAGNOSIS — K922 Gastrointestinal hemorrhage, unspecified: Secondary | ICD-10-CM

## 2023-02-15 LAB — CBC
HCT: 38.4 % (ref 36.0–46.0)
HCT: 38.7 % (ref 36.0–46.0)
Hemoglobin: 11.9 g/dL — ABNORMAL LOW (ref 12.0–15.0)
Hemoglobin: 12.2 g/dL (ref 12.0–15.0)
MCH: 27.8 pg (ref 26.0–34.0)
MCH: 28 pg (ref 26.0–34.0)
MCHC: 31 g/dL (ref 30.0–36.0)
MCHC: 31.5 g/dL (ref 30.0–36.0)
MCV: 89.7 fL (ref 80.0–100.0)
MCV: 88.8 fL (ref 80.0–100.0)
Platelets: 385 10*3/uL (ref 150–400)
Platelets: 417 10*3/uL — ABNORMAL HIGH (ref 150–400)
RBC: 4.28 MIL/uL (ref 3.87–5.11)
RBC: 4.36 MIL/uL (ref 3.87–5.11)
RDW: 13.2 % (ref 11.5–15.5)
RDW: 13.3 % (ref 11.5–15.5)
WBC: 11.1 10*3/uL — ABNORMAL HIGH (ref 4.0–10.5)
WBC: 9.9 10*3/uL (ref 4.0–10.5)
nRBC: 0 % (ref 0.0–0.2)
nRBC: 0 % (ref 0.0–0.2)

## 2023-02-15 LAB — IRON AND TIBC
Iron: 45 ug/dL (ref 28–170)
Saturation Ratios: 11 % (ref 10.4–31.8)
TIBC: 406 ug/dL (ref 250–450)
UIBC: 361 ug/dL

## 2023-02-15 LAB — VITAMIN B12: Vitamin B-12: 772 pg/mL (ref 180–914)

## 2023-02-15 LAB — FOLATE: Folate: 16.1 ng/mL (ref >5.9)

## 2023-02-15 LAB — FERRITIN: Ferritin: 18 ng/mL (ref 11–307)

## 2023-02-15 MED ORDER — SODIUM CHLORIDE 0.9 % IV SOLN
250.0000 mg | Freq: Once | INTRAVENOUS | Status: AC
  Administered 2023-02-15: 250 mg via INTRAVENOUS
  Filled 2023-02-15: qty 20

## 2023-02-15 MED ORDER — HYDROCORTISONE (PERIANAL) 2.5 % EX CREA
TOPICAL_CREAM | Freq: Two times a day (BID) | CUTANEOUS | Status: DC
  Filled 2023-02-15: qty 28.35

## 2023-02-15 MED ORDER — SODIUM CHLORIDE 0.9 % IV SOLN
250.0000 mg | Freq: Once | INTRAVENOUS | Status: DC
  Filled 2023-02-15: qty 20

## 2023-02-15 NOTE — Progress Notes (Signed)
PROGRESS NOTE    Kimberly Davies  [id number] DOB: 09/11/1941 DOA: 02/14/2023  PCP: Ria Bush, MD   Brief Narrative:  This 82 years old female with PMH significant for DVT on Eliquis, dementia, hyperlipidemia, hemorrhoids, osteoporosis, TIA presented to the ED with rectal bleeding.  Patient has developed bright red blood per rectum after bowel movement since then she has been having bright red blood every time she sits on the commode.  Patient denies any pain while having bowel movements.  She takes Eliquis for DVT.  Husband also reports she has a hemorrhoid that occasionally causes minimal rectal bleeding  on toilet paper only.  On arrival in the ED patient is hemodynamically stable.  Hemoglobin slightly dropped from 12.6-11.9.  Patient is admitted for further evaluation.  GI consulted.  Assessment & Plan:   Principal Problem:   Lower GI bleed Active Problems:   Recurrent deep vein thrombosis (DVT) ([facility])   Alzheimer's dementia ([facility])  Lower GI bleed: Given patient is not endorsing any abdominal pain, most likely would be diverticular in nature however it could be hemorrhoids. Will hold Eliquis and monitor serial CBC. Patient continued to reports having blood while sitting on the commode. GI is consulted, awaiting recommendation.  Recurrent DVT: Will hold Eliquis in the setting of diverticular bleed. Will resume Eliquis when able to.  Alzheimer's dementia: Continue Seroquel, Zoloft and Namenda. Delirium precautions.   DVT prophylaxis: SCDs Code Status: Full code Family Communication: No family at bed side. Disposition Plan:    Status is: Observation The patient remains OBS appropriate and will d/c before 2 midnights. Admitted for lower GI bleed. GI is consulted.   Consultants:  Gastroenterology  Procedures: None  Antimicrobials: None  Subjective: Patient was seen and examined at bedside.  Overnight events noted. Patient continued to report having blood  while she sits on the commode. H&H remains stable.  GI is consulted.  Objective: Vitals:   02/14/23 2100 02/14/23 2203 02/15/23 0443 02/15/23 0726  BP: (!) 103/59 118/69 114/70 122/67  Pulse:  68 93 98  Resp:  17 17   Temp:  98 F (36.7 C) (!) 97.5 F (36.4 C) 97.8 F (36.6 C)  TempSrc:    Oral  SpO2:  97% 97% 97%  Weight:  52.8 kg    Height:  '5\' 4"'$  (1.626 m)      Intake/Output Summary (Last 24 hours) at 02/15/2023 1043 Last data filed at 02/15/2023 1000 Gross per 24 hour  Intake 120 ml  Output --  Net 120 ml   Filed Weights   02/14/23 1630 02/14/23 2203  Weight: 56 kg 52.8 kg    Examination:  General exam: Appears calm and comfortable , not in any acute distress. Respiratory system: Clear to auscultation. Respiratory effort normal.  RR 15 Cardiovascular system: S1 & S2 heard, regular rate and rhythm, no murmur. Gastrointestinal system: Abdomen is soft, nontender, nondistended, BS+ Central nervous system: Alert and oriented x 1. No focal neurological deficits. Extremities: No edema, no cyanosis, no clubbing Skin: No rashes, lesions or ulcers Psychiatry: Judgement and insight appear normal. Mood & affect appropriate.     Data Reviewed: I have personally reviewed following labs and imaging studies  CBC: Recent Labs  Lab 02/14/23 1637 02/14/23 2215 02/15/23 0407  WBC 7.8 9.7 11.1*  HGB 12.3 11.4* 11.9*  HCT 39.9 36.8 38.4  MCV 90.5 90.0 89.7  PLT 421* 365 0000000   Basic Metabolic Panel: Recent Labs  Lab 02/14/23 1637  NA 140  K 3.9  CL 102  CO2 31  GLUCOSE 101*  BUN 20  CREATININE 0.95  CALCIUM 9.7   GFR: Estimated Creatinine Clearance: 40 mL/min (by C-G formula based on SCr of 0.95 mg/dL). Liver Function Tests: Recent Labs  Lab 02/14/23 1637  AST 23  ALT 18  ALKPHOS 85  BILITOT 0.6  PROT 8.3*  ALBUMIN 4.0   No results for input(s): "LIPASE", "AMYLASE" in the last 168 hours. No results for input(s): "AMMONIA" in the last 168  hours. Coagulation Profile: Recent Labs  Lab 02/14/23 1637  INR 1.0   Cardiac Enzymes: No results for input(s): "CKTOTAL", "CKMB", "CKMBINDEX", "TROPONINI" in the last 168 hours. BNP (last 3 results) No results for input(s): "PROBNP" in the last 8760 hours. HbA1C: No results for input(s): "HGBA1C" in the last 72 hours. CBG: No results for input(s): "GLUCAP" in the last 168 hours. Lipid Profile: No results for input(s): "CHOL", "HDL", "LDLCALC", "TRIG", "CHOLHDL", "LDLDIRECT" in the last 72 hours. Thyroid Function Tests: No results for input(s): "TSH", "T4TOTAL", "FREET4", "T3FREE", "THYROIDAB" in the last 72 hours. Anemia Panel: No results for input(s): "VITAMINB12", "FOLATE", "FERRITIN", "TIBC", "IRON", "RETICCTPCT" in the last 72 hours. Sepsis Labs: No results for input(s): "PROCALCITON", "LATICACIDVEN" in the last 168 hours.  No results found for this or any previous visit (from the past 240 hour(s)).   Radiology Studies: No results found. Scheduled Meds:  memantine  10 mg Oral BID   QUEtiapine  25 mg Oral QHS   sertraline  50 mg Oral Daily   sodium chloride flush  3 mL Intravenous Q12H   Continuous Infusions:   LOS: 0 days    Time spent: 50 mins.    Duard Brady, MD [facility]   If 7PM-7AM, please contact night-coverage
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
## 2023-02-15 NOTE — Telephone Encounter (Addendum)
Hgb stable, pt admitted for observation.

## 2023-02-16 DIAGNOSIS — K922 Gastrointestinal hemorrhage, unspecified: Secondary | ICD-10-CM | POA: Diagnosis not present

## 2023-02-16 LAB — COMPREHENSIVE METABOLIC PANEL
ALT: 13 U/L (ref 0–44)
AST: 21 U/L (ref 15–41)
Albumin: 3.3 g/dL — ABNORMAL LOW (ref 3.5–5.0)
Alkaline Phosphatase: 71 U/L (ref 38–126)
Anion gap: 9 (ref 5–15)
BUN: 19 mg/dL (ref 8–23)
CO2: 23 mmol/L (ref 22–32)
Calcium: 8.4 mg/dL — ABNORMAL LOW (ref 8.9–10.3)
Chloride: 105 mmol/L (ref 98–111)
Creatinine, Ser: 0.87 mg/dL (ref 0.44–1.00)
GFR, Estimated: >60 mL/min (ref >60)
Glucose, Bld: 133 mg/dL — ABNORMAL HIGH (ref 70–99)
Potassium: 3.7 mmol/L (ref 3.5–5.1)
Sodium: 137 mmol/L (ref 135–145)
Total Bilirubin: 0.8 mg/dL (ref 0.3–1.2)
Total Protein: 7 g/dL (ref 6.5–8.1)

## 2023-02-16 LAB — PHOSPHORUS: Phosphorus: 3.1 mg/dL (ref 2.5–4.6)

## 2023-02-16 LAB — MAGNESIUM: Magnesium: 2.1 mg/dL (ref 1.7–2.4)

## 2023-02-16 NOTE — Discharge Summary (Signed)
Physician Discharge Summary  Kimberly Davies [id number] DOB: March 15, 1941 DOA: 02/14/2023  PCP: Ria Bush, MD  Admit date: 02/14/2023  Discharge date: 02/16/2023  Admitted From: Home.  Disposition:Home.  Recommendations for Outpatient Follow-up:  Follow up with PCP in 1-2 weeks. Please obtain BMP/CBC in one week Patient presented with bright red blood per rectum likely secondary to hemorrhoids.  Home Health:None Equipment/Devices:None  Discharge Condition: Stable CODE STATUS:Full code Diet recommendation: Heart Healthy   Brief [facility] Course: This 82 years old female with PMH significant for DVT on Eliquis, dementia, hyperlipidemia, hemorrhoids, osteoporosis, TIA presented to the ED with rectal bleeding. Patient has developed bright red blood per rectum after bowel movement since then she has been having bright red blood every time she sits on the commode. Patient denies any pain while having bowel movements.  She takes Eliquis for DVT.  Husband also reports she has hemorrhoids that occasionally causes minimal rectal bleeding on toilet paper only.  On arrival in the ED patient is hemodynamically stable.  Hemoglobin slightly dropped from 12.6-11.9.  Patient was admitted for further evaluation.  GI was consulted.  There was no plan for any colonoscopy intervention.  H&H remained stable she was hemodynamically stable.  Husband also declined colonoscopy in the future.  Patient feels better, and wants to be discharged.  Bleeding was most likely secondary to hemorrhoids.  Patient is being discharged home.   Discharge Diagnoses:  Principal Problem:   Lower GI bleed Active Problems:   Recurrent deep vein thrombosis (DVT) (HCC)   Alzheimer's dementia (HCC)   Rectal bleeding   Grade I hemorrhoids  Lower GI bleed: Given patient is not endorsing any abdominal pain, most likely would be diverticular in nature however it could be hemorrhoids. Will hold Eliquis and monitor  serial CBC. Patient continued to reports having blood while sitting on the commode. GI is consulted, no plan for any intervention likely due to hemorrhoids.   Recurrent DVT: Will hold Eliquis in the setting of diverticular bleed. Resume Eliquis at discharge.   Alzheimer's dementia: Continue Seroquel, Zoloft and Namenda. Delirium precautions.  Discharge Instructions  Discharge Instructions     Call MD for:  persistant dizziness or light-headedness   Complete by: As directed    Call MD for:  persistant nausea and vomiting   Complete by: As directed    Diet - low sodium heart healthy   Complete by: As directed    Diet Carb Modified   Complete by: As directed    Discharge instructions   Complete by: As directed    Advised to follow-up with primary care physician in 1 week. Patient presented with bright red blood per rectum likely secondary to hemorrhoids.   Increase activity slowly   Complete by: As directed       Allergies as of 02/16/2023       Reactions   Aricept [donepezil] Nausea Only, Other (See Comments)   Epigastric pain   Codeine Other (See Comments)   Dizziness Tachycardia   Penicillins Other (See Comments)   Reaction unknown        Medication List     TAKE these medications    apixaban 2.5 MG Tabs tablet Commonly known as: Eliquis Take 1 tablet (2.5 mg total) by mouth 2 (two) times daily.   cyanocobalamin 1000 MCG tablet Commonly known as: VITAMIN B12 Take 1 tablet (1,000 mcg total) by mouth every Monday, Wednesday, and Friday.   D3-50 1.25 MG (50000 UT) capsule Generic drug: Cholecalciferol TAKE  1 CAPSULE BY MOUTH ONE TIME PER WEEK   denosumab 60 MG/ML Sosy injection Commonly known as: PROLIA Inject 60 mg into the skin every 6 (six) months.   LORazepam 0.5 MG tablet Commonly known as: ATIVAN Take 0.5 mg by mouth daily as needed.   memantine 10 MG tablet Commonly known as: NAMENDA TAKE 1 TABLET BY MOUTH TWICE A DAY   QUEtiapine 25 MG  tablet Commonly known as: SEROQUEL Take 1 tablet (25 mg total) by mouth at bedtime.   sertraline 50 MG tablet Commonly known as: ZOLOFT TAKE 1 TABLET BY MOUTH EVERY DAY        Follow-up Information     Ria Bush, MD. Go on 02/23/2023.   Specialty: Family Medicine Why: @ 11:30am Contact information: [place] [address] [phone]                Allergies  Allergen Reactions   Aricept [Donepezil] Nausea Only and Other (See Comments)    Epigastric pain   Codeine Other (See Comments)    Dizziness Tachycardia   Penicillins Other (See Comments)    Reaction unknown    Consultations: Gastroenterology   Procedures/Studies: No results found.  Subjective: Patient was seen and examined at bedside. Overnight events noted.   Patient reports doing better,  reports she has no further bleeding in the stools.  She wants to be discharged.  Discharge Exam: Vitals:   02/16/23 0501 02/16/23 0754  BP: 108/66 111/70  Pulse: (!) 103 97  Resp: 16 17  Temp: 98.2 F (36.8 C) 98.4 F (36.9 C)  SpO2: 96% 97%   Vitals:   02/15/23 1700 02/15/23 2003 02/16/23 0501 02/16/23 0754  BP:  109/68 108/66 111/70  Pulse: (!) 106 93 (!) 103 97  Resp:  '18 16 17  '$ Temp:  98.1 F (36.7 C) 98.2 F (36.8 C) 98.4 F (36.9 C)  TempSrc:      SpO2:  95% 96% 97%  Weight:      Height:        General: Pt is alert, awake, not in acute distress Cardiovascular: RRR, S1/S2 +, no rubs, no gallops Respiratory: CTA bilaterally, no wheezing, no rhonchi Abdominal: Soft, NT, ND, bowel sounds + Extremities: no edema, no cyanosis    The results of significant diagnostics from this hospitalization (including imaging, microbiology, ancillary and laboratory) are listed below for reference.     Microbiology: No results found for this or any previous visit (from the past 240 hour(s)).   Labs: BNP (last 3 results) No results for input(s): "BNP" in the last 8760  hours. Basic Metabolic Panel: Recent Labs  Lab 02/14/23 1637 02/16/23 0423  NA 140 137  K 3.9 3.7  CL 102 105  CO2 31 23  GLUCOSE 101* 133*  BUN 20 19  CREATININE 0.95 0.87  CALCIUM 9.7 8.4*  MG  --  2.1  PHOS  --  3.1   Liver Function Tests: Recent Labs  Lab 02/14/23 1637 02/16/23 0423  AST 23 21  ALT 18 13  ALKPHOS 85 71  BILITOT 0.6 0.8  PROT 8.3* 7.0  ALBUMIN 4.0 3.3*   No results for input(s): "LIPASE", "AMYLASE" in the last 168 hours. No results for input(s): "AMMONIA" in the last 168 hours. CBC: Recent Labs  Lab 02/14/23 1637 02/14/23 2215 02/15/23 0407 02/15/23 1645  WBC 7.8 9.7 11.1* 9.9  HGB 12.3 11.4* 11.9* 12.2  HCT 39.9 36.8 38.4 38.7  MCV 90.5 90.0 89.7  88.8  PLT 421* 365 385 417*   Cardiac Enzymes: No results for input(s): "CKTOTAL", "CKMB", "CKMBINDEX", "TROPONINI" in the last 168 hours. BNP: Invalid input(s): "POCBNP" CBG: No results for input(s): "GLUCAP" in the last 168 hours. D-Dimer No results for input(s): "DDIMER" in the last 72 hours. Hgb A1c No results for input(s): "HGBA1C" in the last 72 hours. Lipid Profile No results for input(s): "CHOL", "HDL", "LDLCALC", "TRIG", "CHOLHDL", "LDLDIRECT" in the last 72 hours. Thyroid function studies No results for input(s): "TSH", "T4TOTAL", "T3FREE", "THYROIDAB" in the last 72 hours.  Invalid input(s): "FREET3" Anemia work up Recent Labs    02/15/23 0407 02/15/23 1645  VITAMINB12  --  772  FOLATE 16.1  --   FERRITIN 18  --   TIBC 406  --   IRON 45  --    Urinalysis    Component Value Date/Time   COLORURINE STRAW (A) 11/25/2022 1426   APPEARANCEUR CLEAR (A) 11/25/2022 1426   LABSPEC 1.008 11/25/2022 1426   PHURINE 6.0 11/25/2022 1426   GLUCOSEU NEGATIVE 11/25/2022 1426   HGBUR NEGATIVE 11/25/2022 1426   HGBUR moderate 07/21/2010 1458   BILIRUBINUR NEGATIVE 11/25/2022 1426   BILIRUBINUR negative 01/05/2022 1648   KETONESUR NEGATIVE 11/25/2022 1426   PROTEINUR NEGATIVE  11/25/2022 1426   UROBILINOGEN 0.2 01/05/2022 1648   UROBILINOGEN 0.2 07/21/2010 1458   NITRITE NEGATIVE 11/25/2022 1426   LEUKOCYTESUR NEGATIVE 11/25/2022 1426   Sepsis Labs Recent Labs  Lab 02/14/23 1637 02/14/23 2215 02/15/23 0407 02/15/23 1645  WBC 7.8 9.7 11.1* 9.9   Microbiology No results found for this or any previous visit (from the past 240 hour(s)).   Time coordinating discharge: Over 30 minutes  SIGNED:   Duard Brady, MD  [facility] 02/16/2023, 12:01 PM Pager   If 7PM-7AM, please contact night-coverage

## 2023-02-16 NOTE — Progress Notes (Signed)
Discharge instructions reviewed with patient including followup visits.  Understanding was verbalized and all questions were answered.  IV removed without complication; patient tolerated well.  Patient discharged home via wheelchair in stable condition escorted by volunteer staff.  

## 2023-02-16 NOTE — Discharge Instructions (Signed)
Advised to follow-up with primary care physician in 1 week. Patient presented with bright red blood per rectum likely secondary to hemorrhoids.

## 2023-02-16 NOTE — Progress Notes (Signed)
Mobility Specialist - Progress Note   02/16/23 0923  Mobility  Activity Dangled on edge of bed  Level of Assistance Moderate assist, patient does 50-74%  Assistive Device None  Distance Ambulated (ft) 0 ft  Activity Response Tolerated well  Mobility Referral Yes  $Mobility charge 1 Mobility   Pt semi-supine in bed on RA upon arrival. Pt completes bed mobility ModA with extra time and VC for task initiation. Pt sits EOB and maintains siting balance indep for 3~5 minutes. Pt defers further mobility at this time. Pt left in bed with needs in reach and bed alarm active.   Gretchen Short  Mobility Specialist  02/16/23 9:26 AM

## 2023-02-17 ENCOUNTER — Telehealth: Payer: Self-pay

## 2023-02-17 NOTE — Transitions of Care (Post Inpatient/ED Visit) (Signed)
   02/17/2023  Name: Kimberly Davies MRN: [id number] DOB: 1941-02-20  Today's TOC FU Call Status: Today's TOC FU Call Status:: Unsuccessul Call (1st Attempt) Unsuccessful Call (1st Attempt) Date: 02/17/23  Attempted to reach the patient regarding the most recent Inpatient/ED visit.  Follow Up Plan: Additional outreach attempts will be made to reach the patient to complete the Transitions of Care (Post Inpatient/ED visit) call.   Olympia Fields LPN [place] Advisor Direct Dial [phone]

## 2023-02-21 NOTE — Transitions of Care (Post Inpatient/ED Visit) (Signed)
   02/21/2023  Name: Kimberly Davies MRN: [id number] DOB: 28-Apr-1941  Today's TOC FU Call Status: Today's TOC FU Call Status:: Successful TOC FU Call Competed Unsuccessful Call (1st Attempt) Date: 02/17/23 Unsuccessful Call (2nd Attempt) Date: 02/21/23 [facility] FU Call Complete Date: 02/21/23  Transition Care Management Follow-up Telephone Call Date of Discharge: 02/16/23 Discharge Facility[facility]) Type of Discharge: Inpatient Admission Primary Inpatient Discharge Diagnosis:: Lower GI bleed How have you been since you were released from the hospital?: Better Any questions or concerns?: No  Items Reviewed: Did you receive and understand the discharge instructions provided?: Yes Medications obtained and verified?: Yes (Medications Reviewed) Any new allergies since your discharge?: No Dietary orders reviewed?: Yes Do you have support at home?: Yes  Home Care and Equipment/Supplies: [place]?: No Any new equipment or medical supplies ordered?: No  Functional Questionnaire: Do you need assistance with bathing/showering or dressing?: No Do you need assistance with meal preparation?: No Do you need assistance with eating?: No Do you have difficulty maintaining continence: No Do you need assistance with getting out of bed/getting out of a chair/moving?: No Do you have difficulty managing or taking your medications?: No  Folllow up appointments reviewed: PCP Follow-up appointment confirmed?: Yes Date of PCP follow-up appointment?: 02/23/23 Follow-up Provider: [facility] Follow-up appointment confirmed?: No Do you need transportation to your follow-up appointment?: No Do you understand care options if your condition(s) worsen?: Yes-patient verbalized understanding    [place] LPN [place] Direct Dial [phone]

## 2023-02-23 ENCOUNTER — Encounter: Payer: Self-pay | Admitting: Family Medicine

## 2023-02-23 ENCOUNTER — Ambulatory Visit (INDEPENDENT_AMBULATORY_CARE_PROVIDER_SITE_OTHER): Payer: Medicare Other | Admitting: Family Medicine

## 2023-02-23 ENCOUNTER — Telehealth: Payer: Self-pay | Admitting: Family Medicine

## 2023-02-23 VITALS — BP 112/64 | HR 80 | Temp 96.8°F | Ht 64.0 in | Wt 123.4 lb

## 2023-02-23 DIAGNOSIS — K5909 Other constipation: Secondary | ICD-10-CM

## 2023-02-23 DIAGNOSIS — K625 Hemorrhage of anus and rectum: Secondary | ICD-10-CM

## 2023-02-23 DIAGNOSIS — Z7901 Long term (current) use of anticoagulants: Secondary | ICD-10-CM

## 2023-02-23 DIAGNOSIS — D649 Anemia, unspecified: Secondary | ICD-10-CM

## 2023-02-23 DIAGNOSIS — G309 Alzheimer's disease, unspecified: Secondary | ICD-10-CM | POA: Diagnosis not present

## 2023-02-23 DIAGNOSIS — F02B18 Dementia in other diseases classified elsewhere, moderate, with other behavioral disturbance: Secondary | ICD-10-CM

## 2023-02-23 NOTE — Progress Notes (Signed)
Patient ID: Kimberly Davies, female    DOB: 1941-06-20, 82 y.o.   MRN: [id number]  This visit was conducted in person.  BP 112/64   Pulse 80   Temp (!) 96.8 F (36 C) (Temporal)   Ht 5\' 4"  (1.626 m)   Wt 123 lb 6 oz (56 kg)   SpO2 97%   BMI 21.18 kg/m    CC: hosp f/u visit  Subjective:   HPI: Kimberly Davies is a 82 y.o. female presenting on 02/23/2023 for Hospitalization Follow-up (Admitted on 02/14/23 at [facility], dx lower GI bleed. Pt accompanied by husband, [name]. )   Recent hospitalization for rectal bleed on low dose eliquis for DVT ppx. Presented to ER with BRBPR after BM that persisted for 3 days.  Hospital records reviewed. Med rec performed.  Hgb stable ranged from 11.4 to 12.3. Ferritin low at 18, iron stores were normal.  She was hemoccult positive.  GI consulted - no need for colonoscopy or intervention.  Eliquis was held - resumed on discharge.   Alz - continues seroquel, zoloft, namenda.   Since home, no bleeding, feeling well.  She does have 1 stool every several days.  Husband has started using metamucil supplement.   Home health not set up.  Other follow up appointments scheduled: none ______________________________________________________________________ Hospital admission: 02/14/2023 Hospital discharge: 02/16/2023 TCM f/u phone call: performed on 02/21/2023  Discharge Diagnoses:  Principal Problem:   Lower GI bleed Active Problems:   Recurrent deep vein thrombosis (DVT) (HCC)   Alzheimer's dementia ([place])   Rectal bleeding   Grade I hemorrhoids  Recommendations for Outpatient Follow-up:  Follow up with PCP in 1-2 weeks. Please obtain BMP/CBC in one week Patient presented with bright red blood per rectum likely secondary to hemorrhoids.   Home Health:None Equipment/Devices:None   Discharge Condition: Stable CODE STATUS:Full code Diet recommendation: Heart Healthy      Relevant past medical, surgical, family and social history reviewed and  updated as indicated. Interim medical history since our last visit reviewed. Allergies and medications reviewed and updated. Outpatient Medications Prior to Visit  Medication Sig Dispense Refill   apixaban (ELIQUIS) 2.5 MG TABS tablet Take 1 tablet (2.5 mg total) by mouth 2 (two) times daily. 60 tablet 5   Cholecalciferol (D3-50) 1.25 MG (50000 UT) capsule TAKE 1 CAPSULE BY MOUTH ONE TIME PER WEEK 12 capsule 2   denosumab (PROLIA) 60 MG/ML SOSY injection Inject 60 mg into the skin every 6 (six) months.     LORazepam (ATIVAN) 0.5 MG tablet Take 0.5 mg by mouth daily as needed.     memantine (NAMENDA) 10 MG tablet TAKE 1 TABLET BY MOUTH TWICE A DAY 180 tablet 1   QUEtiapine (SEROQUEL) 25 MG tablet Take 1 tablet (25 mg total) by mouth at bedtime.     sertraline (ZOLOFT) 50 MG tablet TAKE 1 TABLET BY MOUTH EVERY DAY 90 tablet 0   vitamin B-12 (CYANOCOBALAMIN) 1000 MCG tablet Take 1 tablet (1,000 mcg total) by mouth every Monday, Wednesday, and Friday.     No facility-administered medications prior to visit.     Per HPI unless specifically indicated in ROS section below Review of Systems  Objective:  BP 112/64   Pulse 80   Temp (!) 96.8 F (36 C) (Temporal)   Ht 5\' 4"  (1.626 m)   Wt 123 lb 6 oz (56 kg)   SpO2 97%   BMI 21.18 kg/m   Wt Readings from Last 3 Encounters:  02/23/23 123 lb 6 oz (56 kg)  02/14/23 116 lb 6.5 oz (52.8 kg)  02/03/23 123 lb 9.6 oz (56.1 kg)      Physical Exam Vitals and nursing note reviewed.  Constitutional:      Appearance: Normal appearance. She is not ill-appearing.  HENT:     Head: Normocephalic and atraumatic.     Mouth/Throat:     Comments: Wearing mask Eyes:     Extraocular Movements: Extraocular movements intact.     Pupils: Pupils are equal, round, and reactive to light.  Cardiovascular:     Rate and Rhythm: Normal rate and regular rhythm.     Pulses: Normal pulses.     Heart sounds: Normal heart sounds. No murmur heard. Pulmonary:      Effort: Pulmonary effort is normal. No respiratory distress.     Breath sounds: Normal breath sounds. No wheezing, rhonchi or rales.  Abdominal:     General: Bowel sounds are normal. There is no distension.     Palpations: Abdomen is soft. There is no mass.     Tenderness: There is no abdominal tenderness. There is no guarding or rebound.     Hernia: No hernia is present.  Musculoskeletal:     Right lower leg: No edema.     Left lower leg: No edema.  Skin:    General: Skin is warm and dry.     Findings: No rash.  Neurological:     Mental Status: She is alert.  Psychiatric:        Mood and Affect: Mood normal.        Behavior: Behavior normal.        Assessment & Plan:   Problem List Items Addressed This Visit     Anemia    Mild, with low iron stores. Hemoglobin levels overall stable during hospitalization. Avoid oral iron to avoid constipation side effect. Discussed iron rich diet.  Husband may also try Flintstones brand MVI with iron (18mg ) - as he's had success with this.       Rectal bleeding - Primary    BRBPR s/p hospitalization - thought hemorrhoidal bleed from internal hemorrhoid. Stable blood counts during hospitalization, did not need GI intervention.  Will ask her to return at their convenience for labwork (CBC, BMP).  Reviewed preventative measures- avoiding constipation, goal 1 soft stool a day. Consider holding eliquis for a few days if recurrent bleed. Discussed further GI eval, possible banding - they prefer to defer for now.  Will continue watch for recurrence.       Relevant Orders   Basic metabolic panel   CBC with Differential/Platelet   Alzheimer's dementia ([place])    Stable period on seroquel, sertraline, namenda followed by neurology.  Tolerated this hospitalization better than last.       Chronic anticoagulation   Chronic constipation    Reviewed daily bowel regimen for goal 1 soft stool/day - he will try metamucil + miralax low dose daily, hold  for loose stools.         No orders of the defined types were placed in this encounter.   Orders Placed This Encounter  Procedures   Basic metabolic panel    Standing Status:   Future    Standing Expiration Date:   02/23/2024   CBC with Differential/Platelet    Standing Status:   Future    Standing Expiration Date:   02/23/2024    Patient Instructions  Ok to use both miralax and metamucil for  goal 1 soft stool a day.  If worsening bleeding, let us know to consider GI evaluation.  If recurrent bleed, ok to hold eliquis.  Work on iron-rich foods such as meats/chicken/fish, nuts/beans, iron fortified bread and cereal, and leafy greens to increase iron stores. Ok to try Flintstones multivitamin with iron.  We will keep an eye on iron levels as well as blood counts.  Stop up front and reschedule wellness visit in July to after the 12th.   Follow up plan: No follow-ups on file.  Ria Bush, MD

## 2023-02-23 NOTE — Assessment & Plan Note (Signed)
Stable period on seroquel, sertraline, namenda followed by neurology.  Tolerated this hospitalization better than last.

## 2023-02-23 NOTE — Telephone Encounter (Signed)
Spoke with pt's husband, [name] (on dpr), relaying Dr. Synthia Innocent message and scheduling lab visit on 02/28/23 at 3:30.

## 2023-02-23 NOTE — Patient Instructions (Addendum)
Ok to use both miralax and metamucil for goal 1 soft stool a day.  If worsening bleeding, let us know to consider GI evaluation.  If recurrent bleed, ok to hold eliquis.  Work on iron-rich foods such as meats/chicken/fish, nuts/beans, iron fortified bread and cereal, and leafy greens to increase iron stores. Ok to try Flintstones multivitamin with iron.  We will keep an eye on iron levels as well as blood counts.  Stop up front and reschedule wellness visit in July to after the 12th.

## 2023-02-23 NOTE — Telephone Encounter (Signed)
I'd like pt to come in for labwork over the next 1 week which I forgot to check today.  Will update blood counts and kidney function

## 2023-02-23 NOTE — Assessment & Plan Note (Signed)
Reviewed daily bowel regimen for goal 1 soft stool/day - he will try metamucil + miralax low dose daily, hold for loose stools.

## 2023-02-23 NOTE — Assessment & Plan Note (Signed)
BRBPR s/p hospitalization - thought hemorrhoidal bleed from internal hemorrhoid. Stable blood counts during hospitalization, did not need GI intervention.  Will ask her to return at their convenience for labwork (CBC, BMP).  Reviewed preventative measures- avoiding constipation, goal 1 soft stool a day. Consider holding eliquis for a few days if recurrent bleed. Discussed further GI eval, possible banding - they prefer to defer for now.  Will continue watch for recurrence.

## 2023-02-23 NOTE — Assessment & Plan Note (Addendum)
Mild, with low iron stores. Hemoglobin levels overall stable during hospitalization. Avoid oral iron to avoid constipation side effect. Discussed iron rich diet.  Husband may also try Flintstones brand MVI with iron (18mg ) - as he's had success with this.

## 2023-02-28 ENCOUNTER — Other Ambulatory Visit (INDEPENDENT_AMBULATORY_CARE_PROVIDER_SITE_OTHER): Payer: Medicare Other

## 2023-02-28 DIAGNOSIS — K625 Hemorrhage of anus and rectum: Secondary | ICD-10-CM | POA: Diagnosis not present

## 2023-03-01 LAB — CBC WITH DIFFERENTIAL/PLATELET
Basophils Absolute: 0.1 10*3/uL (ref 0.0–0.1)
Basophils Relative: 1.3 % (ref 0.0–3.0)
Eosinophils Absolute: 0.1 10*3/uL (ref 0.0–0.7)
Eosinophils Relative: 1.2 % (ref 0.0–5.0)
HCT: 38.1 % (ref 36.0–46.0)
Hemoglobin: 12.6 g/dL (ref 12.0–15.0)
Lymphocytes Relative: 34.2 % (ref 12.0–46.0)
Lymphs Abs: 2.9 10*3/uL (ref 0.7–4.0)
MCHC: 32.9 g/dL (ref 30.0–36.0)
MCV: 86.9 fl (ref 78.0–100.0)
Monocytes Absolute: 0.9 10*3/uL (ref 0.1–1.0)
Monocytes Relative: 10.2 % (ref 3.0–12.0)
Neutro Abs: 4.5 10*3/uL (ref 1.4–7.7)
Neutrophils Relative %: 53.1 % (ref 43.0–77.0)
Platelets: 441 10*3/uL — ABNORMAL HIGH (ref 150.0–400.0)
RBC: 4.39 Mil/uL (ref 3.87–5.11)
RDW: 14 % (ref 11.5–15.5)
WBC: 8.5 10*3/uL (ref 4.0–10.5)

## 2023-03-01 LAB — BASIC METABOLIC PANEL
BUN: 13 mg/dL (ref 6–23)
CO2: 31 mEq/L (ref 19–32)
Calcium: 9.8 mg/dL (ref 8.4–10.5)
Chloride: 101 mEq/L (ref 96–112)
Creatinine, Ser: 0.97 mg/dL (ref 0.40–1.20)
GFR: 55.68 mL/min — ABNORMAL LOW (ref >60.00)
Glucose, Bld: 93 mg/dL (ref 70–99)
Potassium: 3.9 mEq/L (ref 3.5–5.1)
Sodium: 140 mEq/L (ref 135–145)

## 2023-03-07 ENCOUNTER — Telehealth: Payer: Self-pay | Admitting: Family Medicine

## 2023-03-07 NOTE — Telephone Encounter (Signed)
Patient husband called in and relayed message regarding results. Patient husband stated that rectal bleeding hasn't returned but he will keep check. Thank you!

## 2023-03-07 NOTE — Telephone Encounter (Signed)
See result note for further documentation.

## 2023-04-18 ENCOUNTER — Telehealth: Payer: Self-pay | Admitting: Family Medicine

## 2023-04-18 DIAGNOSIS — Z0279 Encounter for issue of other medical certificate: Secondary | ICD-10-CM

## 2023-04-18 NOTE — Telephone Encounter (Addendum)
Type of forms received:Medical Health Exam  Routed to [facility] received by : Lesly Rubenstein   Individual made aware of 3-5 business day turn around (Y/N):Y  Form completed and patient made aware of charges(Y/N):Y   Faxed to :   Form location: Place in PCP folder

## 2023-04-18 NOTE — Telephone Encounter (Signed)
Placed form in Dr. G's box.  

## 2023-04-18 NOTE — Telephone Encounter (Addendum)
Filled and in Lisa's box. $20

## 2023-04-19 NOTE — Telephone Encounter (Signed)
Spoke with pt's husband, Gerri Spore (on dpr), notifying him pt's form is completed and there is a $20.00 fee to have it completed. He verbalizes understanding and requests for be faxed to [facility] and he will pick up original.  Faxed form to [facility] [employer] at [phone], attn:  Elizabeth Hipsher.   [Made copy to scan and 1 for billing. Placed original form at front office to pick up.]

## 2023-05-30 ENCOUNTER — Telehealth: Payer: Self-pay | Admitting: Family Medicine

## 2023-05-30 DIAGNOSIS — Z0279 Encounter for issue of other medical certificate: Secondary | ICD-10-CM

## 2023-05-30 DIAGNOSIS — Z111 Encounter for screening for respiratory tuberculosis: Secondary | ICD-10-CM

## 2023-05-30 NOTE — Telephone Encounter (Signed)
Pt's husband called back stating he spoke to facility & was told our office could use our own FL2 form. Call back # [phone]

## 2023-05-30 NOTE — Telephone Encounter (Signed)
Spoke with pt's husband, Gerri Spore (on dpr), asking what facility pt is going to. Says it's [address] in [place]. Placed FL2 form and current med list in Dr. Timoteo Expose box.

## 2023-05-30 NOTE — Telephone Encounter (Signed)
Patient's husband contacted the office on patient's behalf, asked if he could get her set up for a lab appointment to have a TB test done through lab draw. Advised patient's husband that I would send a message to Dr. Reece Agar to ask him to put this order in. Husband also stated he would be bringing some FL2 forms for Dr. Sharen Hones to fill out at some point.

## 2023-05-31 NOTE — Addendum Note (Signed)
Addended by: Eustaquio Boyden on: 05/31/2023 07:37 AM   Modules accepted: Orders

## 2023-05-31 NOTE — Telephone Encounter (Addendum)
Ok to TB test - ordered lab test.  FL2 form filled out and placed in Lisa's box.  $20

## 2023-05-31 NOTE — Telephone Encounter (Signed)
Lvm asking pt's husband, Gerri Spore (on dpr), to call back. Need to notify him pt's FL2 form is ready to pick up and there is a $20 fee to have it completed. Also, he needs to schedule lab visit for TB test.   [Placed form at front office. Made copy to scan and 1 for billing.]

## 2023-06-01 ENCOUNTER — Other Ambulatory Visit (INDEPENDENT_AMBULATORY_CARE_PROVIDER_SITE_OTHER): Payer: Medicare Other

## 2023-06-01 DIAGNOSIS — I82402 Acute embolism and thrombosis of unspecified deep veins of left lower extremity: Secondary | ICD-10-CM

## 2023-06-01 DIAGNOSIS — Z111 Encounter for screening for respiratory tuberculosis: Secondary | ICD-10-CM

## 2023-06-02 NOTE — Telephone Encounter (Signed)
Spoke with pt's husband, Gerri Spore (on dpr), notifying him pt's FL2 form is ready to pick up and there is a $20 fee to have it completed.   [Placed form at front office. Made copy to scan and 1 for billing.]

## 2023-06-03 LAB — QUANTIFERON-TB GOLD PLUS
Mitogen-NIL: >10 IU/mL
NIL: 0.02 IU/mL
QuantiFERON-TB Gold Plus: NEGATIVE
TB1-NIL: 0 IU/mL
TB2-NIL: 0 IU/mL

## 2023-06-08 NOTE — Telephone Encounter (Signed)
Noted.  Fyi to Dr. G.  

## 2023-06-08 NOTE — Telephone Encounter (Signed)
[facility] [phone]  Fax: [phone]  Under nutrition status-need diet written in, understanding it is regular  Medications, #3 is an injection, cannot be administered at the facility, patient can be brought to the office to have administered, but needs to be removed from the form

## 2023-06-10 ENCOUNTER — Ambulatory Visit (INDEPENDENT_AMBULATORY_CARE_PROVIDER_SITE_OTHER): Payer: Medicare Other | Admitting: Family Medicine

## 2023-06-10 ENCOUNTER — Encounter: Payer: Self-pay | Admitting: Family Medicine

## 2023-06-10 ENCOUNTER — Telehealth: Payer: Self-pay | Admitting: Family Medicine

## 2023-06-10 VITALS — BP 120/66 | HR 80 | Temp 97.4°F | Ht 64.0 in | Wt 123.5 lb

## 2023-06-10 DIAGNOSIS — F411 Generalized anxiety disorder: Secondary | ICD-10-CM | POA: Diagnosis not present

## 2023-06-10 DIAGNOSIS — G309 Alzheimer's disease, unspecified: Secondary | ICD-10-CM

## 2023-06-10 DIAGNOSIS — M810 Age-related osteoporosis without current pathological fracture: Secondary | ICD-10-CM

## 2023-06-10 DIAGNOSIS — E559 Vitamin D deficiency, unspecified: Secondary | ICD-10-CM | POA: Diagnosis not present

## 2023-06-10 DIAGNOSIS — F02B18 Dementia in other diseases classified elsewhere, moderate, with other behavioral disturbance: Secondary | ICD-10-CM

## 2023-06-10 LAB — BASIC METABOLIC PANEL
BUN: 18 mg/dL (ref 6–23)
CO2: 31 mEq/L (ref 19–32)
Calcium: 10.1 mg/dL (ref 8.4–10.5)
Chloride: 103 mEq/L (ref 96–112)
Creatinine, Ser: 0.83 mg/dL (ref 0.40–1.20)
GFR: 67 mL/min (ref >60.00)
Glucose, Bld: 92 mg/dL (ref 70–99)
Potassium: 4 mEq/L (ref 3.5–5.1)
Sodium: 140 mEq/L (ref 135–145)

## 2023-06-10 MED ORDER — SERTRALINE HCL 50 MG PO TABS: 50.0000 mg | ORAL_TABLET | Freq: Every day | ORAL | 0 refills | Status: DC

## 2023-06-10 MED ORDER — D3-50 1.25 MG (50000 UT) PO CAPS: 50000.0000 [IU] | ORAL_CAPSULE | Freq: Every day | ORAL | 2 refills | Status: DC

## 2023-06-10 MED ORDER — MEMANTINE HCL 10 MG PO TABS: 10.0000 mg | ORAL_TABLET | Freq: Two times a day (BID) | ORAL | 1 refills | Status: DC

## 2023-06-10 MED ORDER — SERTRALINE HCL 50 MG PO TABS: 50.0000 mg | ORAL_TABLET | Freq: Every day | ORAL | 1 refills | Status: DC

## 2023-06-10 NOTE — Telephone Encounter (Signed)
Faxed updated form to [facility] at [phone], attn:  Olivia Mackie.

## 2023-06-10 NOTE — Assessment & Plan Note (Addendum)
Due for rpt prolia. Check BMP today, I've sent request to [facility] to set this up.  Discussed continued Q68mo Prolia injections through our office.

## 2023-06-10 NOTE — Progress Notes (Signed)
Ph: [phone] Fax: [phone]   Patient ID: Kimberly Davies, female    DOB: [birth year], 82 y.o.   MRN: [id number]  This visit was conducted in person.  BP 120/66   Pulse 80   Temp (!) 97.4 F (36.3 C) (Temporal)   Ht 5\' 4"  (1.626 m)   Wt 123 lb 8 oz (56 kg)   SpO2 93%   BMI 21.20 kg/m    CC: f/u visit  Subjective:   HPI: Kimberly Davies is a 82 y.o. female presenting on 06/10/2023 for Medical Management of Chronic Issues (Here for 7 mo f/u. Requests sertraline refill. Pt accompanied by husband, Gerri Spore. )   Known alzheimer's dementia on seroquel, zoloft, namenda followed by neurology Dr Malvin Johns. Has lorazepam 0.5mg  PRN emergency situations.   Notes worsening weakness.  Sleeping during day more.  No behavior changes.  Bowels - every few days. No accidents.  Bladder - several times a day. Few accidents. Uses depends diapers.  Appetite good - eating 3 meals a day. Weight stable.   Upcoming move to [facility] in [place] next week.      Relevant past medical, surgical, family and social history reviewed and updated as indicated. Interim medical history since our last visit reviewed. Allergies and medications reviewed and updated. Outpatient Medications Prior to Visit  Medication Sig Dispense Refill   apixaban (ELIQUIS) 2.5 MG TABS tablet Take 1 tablet (2.5 mg total) by mouth 2 (two) times daily. 60 tablet 5   denosumab (PROLIA) 60 MG/ML SOSY injection Inject 60 mg into the skin every 6 (six) months.     LORazepam (ATIVAN) 0.5 MG tablet Take 0.5 mg by mouth daily as needed.     QUEtiapine (SEROQUEL) 25 MG tablet Take 1 tablet (25 mg total) by mouth at bedtime.     vitamin B-12 (CYANOCOBALAMIN) 1000 MCG tablet Take 1 tablet (1,000 mcg total) by mouth every Monday, Wednesday, and Friday.     Cholecalciferol (D3-50) 1.25 MG (50000 UT) capsule TAKE 1 CAPSULE BY MOUTH ONE TIME PER WEEK 12 capsule 2   memantine (NAMENDA) 10 MG tablet TAKE 1 TABLET BY MOUTH TWICE A DAY  180 tablet 1   sertraline (ZOLOFT) 50 MG tablet TAKE 1 TABLET BY MOUTH EVERY DAY 90 tablet 0   No facility-administered medications prior to visit.     Per HPI unless specifically indicated in ROS section below Review of Systems  Objective:  BP 120/66   Pulse 80   Temp (!) 97.4 F (36.3 C) (Temporal)   Ht 5\' 4"  (1.626 m)   Wt 123 lb 8 oz (56 kg)   SpO2 93%   BMI 21.20 kg/m   Wt Readings from Last 3 Encounters:  06/10/23 123 lb 8 oz (56 kg)  02/23/23 123 lb 6 oz (56 kg)  02/14/23 116 lb 6.5 oz (52.8 kg)      Physical Exam Vitals and nursing note reviewed.  Constitutional:      Appearance: Normal appearance. She is not ill-appearing.     Comments: Ambulates with husband's assistance, wearing fall prevention ;belt  HENT:     Head: Normocephalic and atraumatic.     Mouth/Throat:     Mouth: Mucous membranes are moist.     Pharynx: Oropharynx is clear. No oropharyngeal exudate or posterior oropharyngeal erythema.  Eyes:     Extraocular Movements: Extraocular movements intact.     Pupils: Pupils are equal, round, and reactive to light.  Cardiovascular:  Rate and Rhythm: Normal rate and regular rhythm.     Pulses: Normal pulses.     Heart sounds: Normal heart sounds. No murmur heard. Pulmonary:     Effort: Pulmonary effort is normal. No respiratory distress.     Breath sounds: Normal breath sounds. No wheezing, rhonchi or rales.  Musculoskeletal:     Right lower leg: No edema.     Left lower leg: No edema.  Skin:    General: Skin is warm and dry.     Findings: No rash.  Neurological:     Mental Status: She is alert.  Psychiatric:        Mood and Affect: Mood normal.        Behavior: Behavior normal.       Results for orders placed or performed in visit on 06/01/23  QuantiFERON-TB Gold Plus  Result Value Ref Range   QuantiFERON-TB Gold Plus NEGATIVE NEGATIVE   NIL 0.02 IU/mL   Mitogen-NIL >10.00 IU/mL   TB1-NIL 0.00 IU/mL   TB2-NIL 0.00 IU/mL   Lab  Results  Component Value Date   CREATININE 0.97 02/28/2023   BUN 13 02/28/2023   NA 140 02/28/2023   K 3.9 02/28/2023   CL 101 02/28/2023   CO2 31 02/28/2023   GFR 55 Lab Results  Component Value Date   VD25OH 70.18 11/25/2022   Assessment & Plan:   Problem List Items Addressed This Visit     Anxiety state   Relevant Medications   sertraline (ZOLOFT) 50 MG tablet   Osteoporosis    Due for rpt prolia. Check BMP today, I've sent request to [facility] to set this up.  Discussed continued Q35mo Prolia injections through our office.       Relevant Medications   Cholecalciferol (D3-50) 1.25 MG (50000 UT) capsule   Other Relevant Orders   Basic metabolic panel   Vitamin D deficiency    Vit D weekly replacement refilled today.       Relevant Medications   Cholecalciferol (D3-50) 1.25 MG (50000 UT) capsule   Alzheimer's dementia (HCC) - Primary    Stable period.  Appreciate neurology care Malvin Johns). Continue sertraline 50mg , namenda 10mg  bid, seroquel 25mg  nightly, with PRN lorazepam. Planned move to memory care center in [place] next week.  Intemittent bladder incontinence, no bowel incontinence, good appetite, regular diet.       Relevant Medications   sertraline (ZOLOFT) 50 MG tablet   memantine (NAMENDA) 10 MG tablet     Meds ordered this encounter  Medications   DISCONTD: sertraline (ZOLOFT) 50 MG tablet    Sig: Take 1 tablet (50 mg total) by mouth daily.    Dispense:  90 tablet    Refill:  0   sertraline (ZOLOFT) 50 MG tablet    Sig: Take 1 tablet (50 mg total) by mouth daily.    Dispense:  90 tablet    Refill:  1   Cholecalciferol (D3-50) 1.25 MG (50000 UT) capsule    Sig: Take 1 capsule (50,000 Units total) by mouth daily.    Dispense:  12 capsule    Refill:  2   memantine (NAMENDA) 10 MG tablet    Sig: Take 1 tablet (10 mg total) by mouth 2 (two) times daily.    Dispense:  180 tablet    Refill:  1    Orders Placed This Encounter  Procedures   Basic  metabolic panel    Patient Instructions  Labs today in preparation for prolia shot  We will call you when ready to do shot in next few weeks.  Good to see you today  Return in 6 months for physical   Follow up plan: Return in about 6 months (around 12/11/2023) for annual exam, prior fasting for blood work, medicare wellness visit.  Eustaquio Boyden, MD

## 2023-06-10 NOTE — Patient Instructions (Addendum)
Labs today in preparation for prolia shot We will call you when ready to do shot in next few weeks.  Good to see you today  Return in 6 months for physical

## 2023-06-10 NOTE — Telephone Encounter (Signed)
BMP checked today. Can we set pt up for prolia shot as due?  Plz call husband to coordinate due to dementia

## 2023-06-10 NOTE — Assessment & Plan Note (Addendum)
Stable period.  Appreciate neurology care Kimberly Davies). Continue sertraline 50mg , namenda 10mg  bid, seroquel 25mg  nightly, with PRN lorazepam. Planned move to memory care center in [place] next week.  Intemittent bladder incontinence, no bowel incontinence, good appetite, regular diet.

## 2023-06-10 NOTE — Assessment & Plan Note (Signed)
Vit D weekly replacement refilled today.

## 2023-06-15 ENCOUNTER — Telehealth: Payer: Self-pay | Admitting: Family Medicine

## 2023-06-15 ENCOUNTER — Telehealth: Payer: Self-pay

## 2023-06-15 DIAGNOSIS — E559 Vitamin D deficiency, unspecified: Secondary | ICD-10-CM

## 2023-06-15 MED ORDER — DOCUSATE SODIUM 100 MG PO CAPS: 100.0000 mg | ORAL_CAPSULE | Freq: Every day | ORAL | Status: DC

## 2023-06-15 MED ORDER — D3-50 1.25 MG (50000 UT) PO CAPS: 50000.0000 [IU] | ORAL_CAPSULE | ORAL | 2 refills | Status: DC

## 2023-06-15 NOTE — Telephone Encounter (Signed)
Patient husband called in and stated that Kimberly Davies is now living at a facility. He stated that they are wanting to know if Dr. Reece Agar could prescribe her a  stool softener to take on a daily basis. Please advise. Thank you!

## 2023-06-15 NOTE — Telephone Encounter (Addendum)
Would suggest Colace (docusate) stool softener 100mg  one daily to start, let us know if not effective. I've added to med list.

## 2023-06-15 NOTE — Telephone Encounter (Signed)
Received faxed message from [facility] asking for clarification of sig for vit D3 50,000 UT cap.  Current sig:  Take 1 capsule (50,000 Units) by mouth daily. Should be weekly?  Per 06/10/23 OV notes, pt is to take 1 cap wkly.   Corrected rx and e-scribed. Fyi to Dr. Reece Agar.

## 2023-06-15 NOTE — Telephone Encounter (Signed)
Agree with this thank you.  

## 2023-06-15 NOTE — Telephone Encounter (Signed)
Filled and in Lisa's box.  

## 2023-06-15 NOTE — Telephone Encounter (Signed)
Patient husband called in and relayed message below. He stated that the facility will need an order or script on what Dr. Reece Agar suggest and how to take it. It can be faxed over to [phone] Attn: [place] Jones. Thank you!

## 2023-06-15 NOTE — Telephone Encounter (Signed)
Lvm asking pt's husband, Gerri Spore (on dpr), to call back. Need to relay Dr. Timoteo Expose message.

## 2023-06-15 NOTE — Addendum Note (Signed)
Addended by: Eustaquio Boyden on: 06/15/2023 01:45 PM   Modules accepted: Orders

## 2023-06-15 NOTE — Telephone Encounter (Signed)
Patients husband called back stating that he wont need an prescription for the stool softener,he will get her some OTC stool softeners,but the facility needs the permission from Dr G to give them to her and to know exactly how many to give her.

## 2023-06-16 NOTE — Telephone Encounter (Signed)
Faxed written order to [phone], attn:  [place] Jones.

## 2023-06-17 ENCOUNTER — Encounter (HOSPITAL_COMMUNITY): Payer: Self-pay

## 2023-06-17 ENCOUNTER — Emergency Department (HOSPITAL_COMMUNITY)
Admission: EM | Admit: 2023-06-17 | Discharge: 2023-06-17 | Disposition: A | Discharge status: Home / Self Care | Payer: Medicare Other | Attending: Emergency Medicine | Admitting: Emergency Medicine

## 2023-06-17 ENCOUNTER — Emergency Department (HOSPITAL_COMMUNITY): Payer: Medicare Other

## 2023-06-17 ENCOUNTER — Other Ambulatory Visit: Payer: Self-pay

## 2023-06-17 DIAGNOSIS — M25552 Pain in left hip: Secondary | ICD-10-CM | POA: Insufficient documentation

## 2023-06-17 DIAGNOSIS — M25562 Pain in left knee: Secondary | ICD-10-CM | POA: Diagnosis not present

## 2023-06-17 DIAGNOSIS — F0390 Unspecified dementia without behavioral disturbance: Secondary | ICD-10-CM | POA: Insufficient documentation

## 2023-06-17 DIAGNOSIS — W1830XA Fall on same level, unspecified, initial encounter: Secondary | ICD-10-CM | POA: Diagnosis not present

## 2023-06-17 DIAGNOSIS — W19XXXA Unspecified fall, initial encounter: Secondary | ICD-10-CM

## 2023-06-17 LAB — COMPREHENSIVE METABOLIC PANEL
ALT: 19 U/L (ref 0–44)
AST: 34 U/L (ref 15–41)
Albumin: 3.6 g/dL (ref 3.5–5.0)
Alkaline Phosphatase: 80 U/L (ref 38–126)
Anion gap: 9 (ref 5–15)
BUN: 17 mg/dL (ref 8–23)
CO2: 27 mmol/L (ref 22–32)
Calcium: 9.4 mg/dL (ref 8.9–10.3)
Chloride: 105 mmol/L (ref 98–111)
Creatinine, Ser: 0.88 mg/dL (ref 0.44–1.00)
GFR, Estimated: >60 mL/min (ref >60)
Glucose, Bld: 100 mg/dL — ABNORMAL HIGH (ref 70–99)
Potassium: 3.9 mmol/L (ref 3.5–5.1)
Sodium: 141 mmol/L (ref 135–145)
Total Bilirubin: 0.3 mg/dL (ref 0.3–1.2)
Total Protein: 7.7 g/dL (ref 6.5–8.1)

## 2023-06-17 LAB — CBC
HCT: 40.8 % (ref 36.0–46.0)
Hemoglobin: 12.7 g/dL (ref 12.0–15.0)
MCH: 28 pg (ref 26.0–34.0)
MCHC: 31.1 g/dL (ref 30.0–36.0)
MCV: 89.9 fL (ref 80.0–100.0)
Platelets: 413 10*3/uL — ABNORMAL HIGH (ref 150–400)
RBC: 4.54 MIL/uL (ref 3.87–5.11)
RDW: 14 % (ref 11.5–15.5)
WBC: 10.8 10*3/uL — ABNORMAL HIGH (ref 4.0–10.5)
nRBC: 0 % (ref 0.0–0.2)

## 2023-06-17 NOTE — Progress Notes (Signed)
Orthopedic Tech Progress Note Patient Details:  Kimberly Davies 1941-08-24 [id number]  Patient ID: Kimberly Davies, female   DOB: August 27, 1941, 82 y.o.   MRN: [id number] Level II; not needed. Darleen Crocker 06/17/2023, 5:55 PM

## 2023-06-17 NOTE — ED Notes (Signed)
[facility] called, NO ETA

## 2023-06-17 NOTE — ED Provider Notes (Signed)
[place] EMERGENCY DEPARTMENT AT [facility] Provider Note   CSN: [id number] Arrival date & time: 06/17/23  1747     History Chief Complaint  Patient presents with   Fall    HPI Kimberly Davies is a 82 y.o. female presenting for chief complaint of ground-level fall.  Patient is coming from a facility and does not remember the fall.  She is on thinners was activated as a level 2 trauma per hospital policy.  She is endorsing left hip pain.  She denies any other concerns or injuries EMS did not appreciate any other injuries other than her left hip. Per EMS, patient was found on the ground and is otherwise at her mental status baseline.  Patient's recorded medical, surgical, social, medication list and allergies were reviewed in the Snapshot window as part of the initial history.   Review of Systems   Review of Systems  Unable to perform ROS: Dementia    Physical Exam Updated Vital Signs BP (!) 138/59 (BP Location: Left Arm)   Temp 98.5 F (36.9 C) (Oral)   Ht 5\' 4"  (1.626 m)   BMI 21.20 kg/m  Physical Exam Vitals and nursing note reviewed.  Constitutional:      General: She is not in acute distress.    Appearance: She is well-developed.  HENT:     Head: Normocephalic and atraumatic.  Eyes:     Conjunctiva/sclera: Conjunctivae normal.  Cardiovascular:     Rate and Rhythm: Normal rate and regular rhythm.     Heart sounds: No murmur heard. Pulmonary:     Effort: Pulmonary effort is normal. No respiratory distress.     Breath sounds: Normal breath sounds.  Abdominal:     General: There is no distension.     Palpations: Abdomen is soft.     Tenderness: There is no abdominal tenderness. There is no right CVA tenderness or left CVA tenderness.  Musculoskeletal:        General: Tenderness (TTP left hip and left knee) present. No swelling. Normal range of motion.     Cervical back: Neck supple.  Skin:    General: Skin is warm and dry.  Neurological:      General: No focal deficit present.     Mental Status: She is alert and oriented to person, place, and time. Mental status is at baseline.     Cranial Nerves: No cranial nerve deficit.      ED Course/ Medical Decision Making/ A&P Clinical Course as of 06/17/23 2110  Fri Jun 17, 2023  1903 CT HEAD WO CONTRAST [CC]  2042 Tried all contact numbers with no answer [CC]    Clinical Course User Index [CC] Glyn Ade, MD    Procedures .Critical Care  Performed by: Glyn Ade, MD Authorized by: Glyn Ade, MD   Critical care provider statement:    Critical care time (minutes):  30   Critical care was necessary to treat or prevent imminent or life-threatening deterioration of the following conditions:  Trauma   Critical care was time spent personally by me on the following activities:  Development of treatment plan with patient or surrogate, discussions with consultants, evaluation of patient's response to treatment, examination of patient, ordering and review of laboratory studies, ordering and review of radiographic studies, ordering and performing treatments and interventions, pulse oximetry, re-evaluation of patient's condition and review of old charts    Medications Ordered in ED Medications - No data to display  Medical Decision Making:  Kimberly Davies is a 82 y.o. female who presented to the ED today with a high mechanism trauma, detailed above.    By institutional and departmental policy this was activated as a level 2 trauma. Handoff received from EMS.  Patient placed on continuous vitals and telemetry monitoring while in ED which was reviewed periodically.   Given this mechanism of trauma, a full physical exam was performed.  Reviewed and confirmed nursing documentation for past medical history, family history, social history.    Initial Assessment/Plan:   I was called emergently to patient's bedside for a primary survey.  Primary survey: Airway  intact.  BL breath sounds present.   Circulation established with WNL BP, 2 large bore IVs, and radial/femoral pulses.   Disability evaluation negative. No obvious disability requiring intervention.   Patient fully exposed and all injuries were noted, any penetrating injuries were labeled with radiopaque markers.  No emergent interventions took place in the primary survey.   EFAST deferred.   Secondary survey: Once patient was stabilized, I personally performed a secondary survey to evaluate for any other injuries.  Results of this evaluation documented in the physical exam section. This is a patient presenting with a high mechanism trauma.  As such, I have considered intracranial injuries including intracranial hemorrhage, intrathoracic injuries including blunt myocardial or blunt lung injury, blunt abdominal injuries including aortic dissection, bladder injury, spleen injury, liver injury and I have considered orthopedic injuries including extremity or spinal injury.   This was all evaluated by the below imaging as well as concurrently ordered laboratory evaluation which was reviewed.  Radiology: All radiology results were reviewed independently and agree with reads per radiology provider. DG Hip Unilat W or Wo Pelvis 2-3 Views Left  Result Date: 06/17/2023 CLINICAL DATA:  Fall EXAM: DG HIP (WITH OR WITHOUT PELVIS) 2-3V LEFT COMPARISON:  02/02/2004 FINDINGS: There is no evidence of hip fracture or dislocation. There is no evidence of arthropathy or other focal bone abnormality. IMPRESSION: Negative. Electronically Signed   By: Duanne Guess D.O.   On: 06/17/2023 18:48   DG Knee 2 Views Left  Result Date: 06/17/2023 CLINICAL DATA:  Unwitnessed fall EXAM: LEFT KNEE - 1-2 VIEW COMPARISON:  None Available. FINDINGS: No evidence of fracture, dislocation, or joint effusion. No evidence of arthropathy or other focal bone abnormality. Soft tissues are unremarkable. IMPRESSION: Negative.  Electronically Signed   By: Jasmine Pang M.D.   On: 06/17/2023 18:46   CT HEAD WO CONTRAST  Result Date: 06/17/2023 CLINICAL DATA:  Head trauma, moderate-severe; Polytrauma, blunt EXAM: CT HEAD WITHOUT CONTRAST CT CERVICAL SPINE WITHOUT CONTRAST TECHNIQUE: Multidetector CT imaging of the head and cervical spine was performed following the standard protocol without intravenous contrast. Multiplanar CT image reconstructions of the cervical spine were also generated. RADIATION DOSE REDUCTION: This exam was performed according to the departmental dose-optimization program which includes automated exposure control, adjustment of the mA and/or kV according to patient size and/or use of iterative reconstruction technique. COMPARISON:  CT Head and C Spine 08/26/22 FINDINGS: CT HEAD FINDINGS Brain: No evidence of acute infarction, hemorrhage, hydrocephalus, extra-axial collection or mass lesion/mass effect. Sequela of moderate chronic microvascular ischemic change. Do not uvula incompatible Vascular: No hyperdense vessel or unexpected calcification. Skull: Normal. Negative for fracture or focal lesion. Sinuses/Orbits: No middle ear or mastoid effusion. Paranasal sinuses clear. Orbits are unremarkable. Other: None. CT CERVICAL SPINE FINDINGS Alignment: Normal. Skull base and vertebrae: No acute fracture. No primary bone lesion or focal pathologic process.  Soft tissues and spinal canal: No prevertebral fluid or swelling. No visible canal hematoma. Disc levels:  No evidence of high-grade spinal canal stenosis. Upper chest: Right apical pleural-parenchymal scarring. Other: None IMPRESSION: 1. No CT evidence of intracranial injury. 2. No acute fracture or traumatic malalignment of the cervical spine. Electronically Signed   By: Lorenza Cambridge M.D.   On: 06/17/2023 18:39   CT CERVICAL SPINE WO CONTRAST  Result Date: 06/17/2023 CLINICAL DATA:  Head trauma, moderate-severe; Polytrauma, blunt EXAM: CT HEAD WITHOUT CONTRAST CT  CERVICAL SPINE WITHOUT CONTRAST TECHNIQUE: Multidetector CT imaging of the head and cervical spine was performed following the standard protocol without intravenous contrast. Multiplanar CT image reconstructions of the cervical spine were also generated. RADIATION DOSE REDUCTION: This exam was performed according to the departmental dose-optimization program which includes automated exposure control, adjustment of the mA and/or kV according to patient size and/or use of iterative reconstruction technique. COMPARISON:  CT Head and C Spine 08/26/22 FINDINGS: CT HEAD FINDINGS Brain: No evidence of acute infarction, hemorrhage, hydrocephalus, extra-axial collection or mass lesion/mass effect. Sequela of moderate chronic microvascular ischemic change. Do not uvula incompatible Vascular: No hyperdense vessel or unexpected calcification. Skull: Normal. Negative for fracture or focal lesion. Sinuses/Orbits: No middle ear or mastoid effusion. Paranasal sinuses clear. Orbits are unremarkable. Other: None. CT CERVICAL SPINE FINDINGS Alignment: Normal. Skull base and vertebrae: No acute fracture. No primary bone lesion or focal pathologic process. Soft tissues and spinal canal: No prevertebral fluid or swelling. No visible canal hematoma. Disc levels:  No evidence of high-grade spinal canal stenosis. Upper chest: Right apical pleural-parenchymal scarring. Other: None IMPRESSION: 1. No CT evidence of intracranial injury. 2. No acute fracture or traumatic malalignment of the cervical spine. Electronically Signed   By: Lorenza Cambridge M.D.   On: 06/17/2023 18:39    Final Reassessment and Plan:   Reassessed at bedside.  Had a long conversation with family at bedside.  She recently got moved to a new facility and had 2 falls this week.  She is otherwise at her mental status baseline denies fevers chills nausea vomiting shortness of breath.  She is ambulatory tolerating p.o. intake and otherwise in no acute distress.  They are  planning to hire additional help to help her at her facility and feel comfortable taking her home.   Disposition:  I have considered need for hospitalization, however, considering all of the above, I believe this patient is stable for discharge at this time.  Patient/family educated about specific return precautions for given chief complaint and symptoms.  Patient/family educated about follow-up with PCP .     Patient/family expressed understanding of return precautions and need for follow-up. Patient spoken to regarding all imaging and laboratory results and appropriate follow up for these results. All education provided in verbal form with additional information in written form. Time was allowed for answering of patient questions. Patient discharged.    Emergency Department Medication Summary:   Medications - No data to display         Clinical Impression:  1. Fall, initial encounter      Discharge   Final Clinical Impression(s) / ED Diagnoses Final diagnoses:  Fall, initial encounter    Rx / DC Orders ED Discharge Orders     None         Glyn Ade, MD 06/17/23 2111

## 2023-06-17 NOTE — ED Triage Notes (Signed)
Pt BIB [facility] from [facility]. Staff says that patient was found on floor from an unwitnessed fall. Unknown if pt hit head. Pt has hx of dementia and responding to pelvis pain.   EMS VS HR 104 BP 138/82 CBG 117

## 2023-06-17 NOTE — ED Notes (Signed)
Gone to CT

## 2023-06-20 ENCOUNTER — Telehealth: Payer: Self-pay

## 2023-06-20 NOTE — Transitions of Care (Post Inpatient/ED Visit) (Unsigned)
   06/20/2023  Name: Kimberly Davies MRN: [id number] DOB: 1941-11-12  Today's TOC FU Call Status: Today's TOC FU Call Status:: Unsuccessul Call (1st Attempt) Unsuccessful Call (1st Attempt) Date: 06/20/23  Attempted to reach the patient regarding the most recent Inpatient/ED visit.  Follow Up Plan: Additional outreach attempts will be made to reach the patient to complete the Transitions of Care (Post Inpatient/ED visit) call.   Signature   Woodfin Ganja LPN [facility] Nurse Health Advisor Direct Dial [phone]

## 2023-06-21 NOTE — Transitions of Care (Post Inpatient/ED Visit) (Unsigned)
   06/21/2023  Name: Kimberly Davies MRN: [id number] DOB: Feb 15, 1941  Today's TOC FU Call Status: Today's TOC FU Call Status:: Unsuccessful Call (2nd Attempt) Unsuccessful Call (1st Attempt) Date: 06/20/23 Unsuccessful Call (2nd Attempt) Date: 06/21/23  Attempted to reach the patient regarding the most recent Inpatient/ED visit.  Follow Up Plan: Additional outreach attempts will be made to reach the patient to complete the Transitions of Care (Post Inpatient/ED visit) call.   Signature   Woodfin Ganja LPN [facility] Nurse Health Advisor Direct Dial [phone]

## 2023-06-22 NOTE — Transitions of Care (Post Inpatient/ED Visit) (Signed)
   06/22/2023  Name: SUNDUS PETE MRN: [id number] DOB: [birth year]  Today's TOC FU Call Status: Today's TOC FU Call Status:: Successful TOC FU Call Competed Unsuccessful Call (1st Attempt) Date: 06/20/23 Unsuccessful Call (2nd Attempt) Date: 06/21/23 Unsuccessful Call (3rd Attempt) Date: 06/22/23 [facility] FU Call Complete Date: 06/22/23  Attempted to reach the patient regarding the most recent Inpatient/ED visit.  Follow Up Plan: No further outreach attempts will be made at this time. We have been unable to contact the patient.  Signature   Woodfin Ganja LPN [facility] Nurse Health Advisor Direct Dial [phone]

## 2023-06-23 ENCOUNTER — Other Ambulatory Visit (HOSPITAL_COMMUNITY): Payer: Self-pay

## 2023-06-23 ENCOUNTER — Telehealth: Payer: Self-pay

## 2023-06-23 DIAGNOSIS — M810 Age-related osteoporosis without current pathological fracture: Secondary | ICD-10-CM

## 2023-06-23 NOTE — Telephone Encounter (Signed)
Pt ready for scheduling for Prolia on or after : 06/23/23  Out-of-pocket cost due at time of visit: $332  Primary: [employer] - Medicare Prolia co-insurance: 20% Admin fee co-insurance: $30  Secondary: n/a Prolia co-insurance:  Admin fee co-insurance:   Medical Benefit Details: Date Benefits were checked: 06/23/23 Deductible: no/ Coinsurance: 20%/ Admin Fee: $30  Prior Auth: Approved PA# [id number] Expiration Date: 06/23/23 to 06/22/24   Pharmacy benefit: Copay $375 If patient wants fill through the pharmacy benefit please send prescription to: [facility], and include estimated need by date in rx notes. Pharmacy will ship medication directly to the office.  Patient not eligible for Prolia Copay Card. Copay Card can make patient's cost as little as $25. Link to apply: [email or web address]://www.amgensupportplus.[email or web address]/copay  ** This summary of benefits is an estimation of the patient's out-of-pocket cost. Exact cost may very based on individual plan coverage.

## 2023-06-24 NOTE — Telephone Encounter (Signed)
Duplicate message closing see pharmacy note for further documentation.

## 2023-06-24 NOTE — Telephone Encounter (Signed)
Patient returned call regarding prolia,would like a call bcak

## 2023-06-24 NOTE — Telephone Encounter (Signed)
Left message to return call to our office.  Labs have been done recently

## 2023-06-27 NOTE — Addendum Note (Signed)
Addended by: Eustaquio Boyden on: 06/27/2023 03:28 PM   Modules accepted: Orders

## 2023-06-27 NOTE — Telephone Encounter (Signed)
Called both numbers left message to call office.

## 2023-06-27 NOTE — Telephone Encounter (Addendum)
Ok to stop prolia.  Would recommend fosamax 70mg  weekly x 12 doses (3 months) after stopping prolia then may stop fosamax as well. This is to help decrease risk of fracture immediately after stopping prolia.  Rx written and in Lisa's box.   Continue weekly vitamin D Rx.

## 2023-06-27 NOTE — Telephone Encounter (Signed)
Husband called patient is in long term memory. She has became immobile he does not think that he can get her in car for appointment.  Is this something we can D/C for patient at this time?

## 2023-06-27 NOTE — Telephone Encounter (Signed)
Faxed written order to [facility] at [phone].

## 2023-06-30 ENCOUNTER — Telehealth: Payer: Self-pay

## 2023-06-30 NOTE — Telephone Encounter (Signed)
Received faxed form from Rolly Pancake of [facility] to be completed.   Spoke with pt's husband, Gerri Spore (on dpr), to confirm pt is being moved from [employer] to [facility]. He states he's on the fence and asked that we hold on to the form for a couple of days giving him time to think about it. He will call back to let us know what he decides. Fyi to Dr. Adair Patter is in basket on Lisa's desk.]

## 2023-06-30 NOTE — Telephone Encounter (Signed)
Noted! Thank you

## 2023-07-01 NOTE — Telephone Encounter (Signed)
Noted.   Placed form in Dr. G's box. 

## 2023-07-01 NOTE — Telephone Encounter (Addendum)
Filled and in Lisa's box. I don't know if they'll accept quantiferon TB blood test. If not, will need 2 step test PPD.  May also need COVID testing. Will send in and see if they accept without all this.

## 2023-07-01 NOTE — Telephone Encounter (Signed)
Patients husband called to let lisa know that he can go ahead and send the FL2 form to [employer].

## 2023-07-04 ENCOUNTER — Telehealth: Payer: Self-pay | Admitting: Family Medicine

## 2023-07-04 NOTE — Telephone Encounter (Signed)
Home Health verbal orders Caller Name: Toniann Fail, Speech Pathologist  Agency Name: [facility]   Callback number: [phone], secured voicemail   Requesting OT/PT/Skilled nursing/Social Work/Speech: Speech Therapy   Reason: Cognitive communication skills   Frequency: 1wk 1, 2wk 2, 1wk 6  Please forward to [facility] pool or providers CMA

## 2023-07-04 NOTE — Telephone Encounter (Signed)
Agree with this. Thanks.  

## 2023-07-04 NOTE — Telephone Encounter (Signed)
Noted. Faxed form to [employer] [facility] at [phone], attn:  Charity.

## 2023-07-05 NOTE — Telephone Encounter (Signed)
Spoke with Kimberly Davies [facility] informing her Dr. Reece Agar is giving verbal orders for services requested for pt.

## 2023-07-05 NOTE — Telephone Encounter (Signed)
Agree with this. Thanks.  

## 2023-07-05 NOTE — Telephone Encounter (Signed)
Home Health verbal orders Caller Name: steve  Agency Name: [facility]   Callback number: [phone], secured  Requesting OT/PT/Skilled nursing/Social Work/Speech:  Reason:  Frequency: once every two weeks for four weeks   Please forward to [facility] pool or providers CMA

## 2023-07-05 NOTE — Telephone Encounter (Signed)
Called left detailed message on secured voicemail for Kimberly Davies. Requested call back if any questions

## 2023-07-06 ENCOUNTER — Encounter (INDEPENDENT_AMBULATORY_CARE_PROVIDER_SITE_OTHER): Payer: Self-pay

## 2023-07-06 NOTE — Telephone Encounter (Signed)
Noted  

## 2023-07-12 ENCOUNTER — Telehealth: Payer: Self-pay | Admitting: Family Medicine

## 2023-07-12 NOTE — Telephone Encounter (Signed)
Home Health verbal orders Caller Name:Mellisa Agency Name: [facility]  Callback number: [phone]  Requesting OT/PT/Skilled nursing/Social Work/Speech:  Reason:OT  Frequency: 2x - 2 W   ,  1x W - 5 W  Please forward to [employer] or providers CMA

## 2023-07-12 NOTE — Telephone Encounter (Signed)
Agree with this. Thanks.  

## 2023-07-13 DIAGNOSIS — E785 Hyperlipidemia, unspecified: Secondary | ICD-10-CM

## 2023-07-13 DIAGNOSIS — G309 Alzheimer's disease, unspecified: Secondary | ICD-10-CM

## 2023-07-13 DIAGNOSIS — M810 Age-related osteoporosis without current pathological fracture: Secondary | ICD-10-CM

## 2023-07-13 DIAGNOSIS — Z9181 History of falling: Secondary | ICD-10-CM

## 2023-07-13 DIAGNOSIS — Z7901 Long term (current) use of anticoagulants: Secondary | ICD-10-CM

## 2023-07-13 DIAGNOSIS — Z8673 Personal history of transient ischemic attack (TIA), and cerebral infarction without residual deficits: Secondary | ICD-10-CM

## 2023-07-13 DIAGNOSIS — F0284 Dementia in other diseases classified elsewhere, unspecified severity, with anxiety: Secondary | ICD-10-CM

## 2023-07-13 DIAGNOSIS — Z79899 Other long term (current) drug therapy: Secondary | ICD-10-CM

## 2023-07-13 DIAGNOSIS — F0283 Dementia in other diseases classified elsewhere, unspecified severity, with mood disturbance: Secondary | ICD-10-CM

## 2023-07-13 DIAGNOSIS — Z993 Dependence on wheelchair: Secondary | ICD-10-CM

## 2023-07-13 DIAGNOSIS — K5909 Other constipation: Secondary | ICD-10-CM

## 2023-07-13 DIAGNOSIS — F32A Depression, unspecified: Secondary | ICD-10-CM

## 2023-07-13 NOTE — Telephone Encounter (Signed)
Lvm asking Melissa, of [facility], to call back. Need to inform her Dr. Reece Agar is giving verbal orders for services requested for pt.

## 2023-07-13 NOTE — Telephone Encounter (Signed)
Melissa called back, told pt Dr. Timoteo Expose response on verbal orders approval. Call back # [phone]

## 2023-07-13 NOTE — Telephone Encounter (Signed)
Noted  

## 2023-08-09 ENCOUNTER — Other Ambulatory Visit: Payer: Medicare Other

## 2023-08-09 ENCOUNTER — Ambulatory Visit: Payer: Medicare Other | Admitting: Oncology

## 2023-08-16 ENCOUNTER — Telehealth: Payer: Self-pay | Admitting: Family Medicine

## 2023-08-16 NOTE — Telephone Encounter (Signed)
Contacted Kimberly Davies to schedule their annual wellness visit. Patient declined to schedule AWV at this time. Transferred care to the memory care unit.  [facility] Care Guide [facility] AWV TEAM Direct Dial: [phone]

## 2023-08-23 ENCOUNTER — Encounter: Payer: Self-pay | Admitting: Gastroenterology

## 2023-08-25 ENCOUNTER — Telehealth: Payer: Self-pay | Admitting: Family Medicine

## 2023-08-25 NOTE — Telephone Encounter (Signed)
Home Health verbal orders Caller Name: Efraim Kaufmann Agency Name: [facility]  Callback number:  [phone]  Requesting OT   Frequency: 2x a week for 3 wks/ 1x a week for 5 wks  Please forward to [facility] pool or providers CMA

## 2023-08-25 NOTE — Telephone Encounter (Signed)
Agree with this. Thanks.

## 2023-08-26 NOTE — Telephone Encounter (Signed)
Spoke with Melissa informing her Dr Reece Agar is giving verbal orders for services requested.

## 2023-12-21 ENCOUNTER — Telehealth: Payer: Self-pay | Admitting: Family Medicine

## 2023-12-21 NOTE — Telephone Encounter (Signed)
 Contacted Kimberly Davies to schedule their annual wellness visit. Patient declined to schedule AWV at this time. Transferred care.  [facility] Care Guide [facility] AWV TEAM Direct Dial: [phone]

## 2023-12-21 NOTE — Telephone Encounter (Signed)
 Made in error

## 2024-03-06 IMAGING — CT CT HEAD W/O CM
4 series · 17 of 47 positions shown, 19 images · non-contrast
Comparison: 01/03/2022

CLINICAL DATA: Trauma, fall



[Series 2: head wo · axial · 0.45mm/px · z∈[-67,+53]mm · 7 of 34 slices shown, 9 images]
[im 5/34  brain]
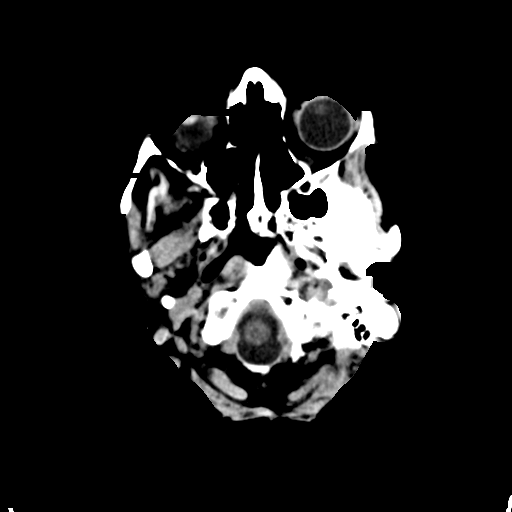
[im 5/34  bone]
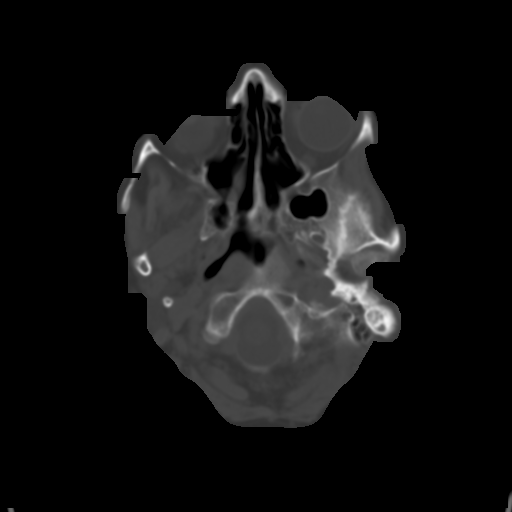
[im 9/34  brain]
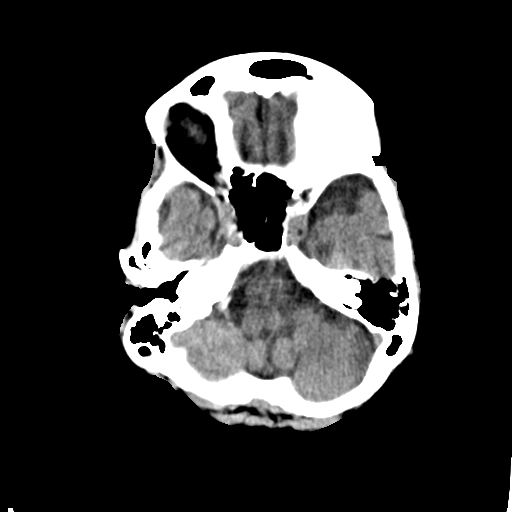
[im 13/34  brain]
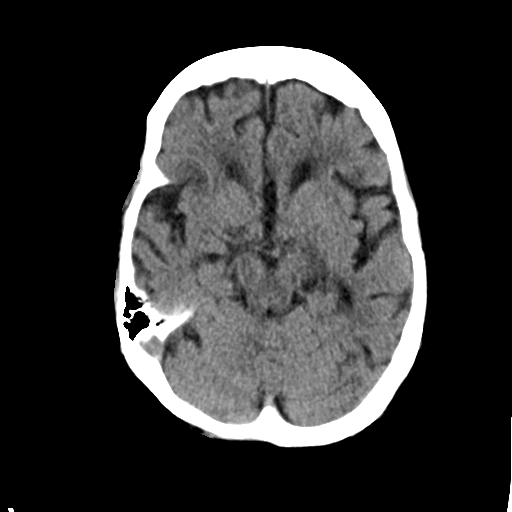
[im 17/34  brain]
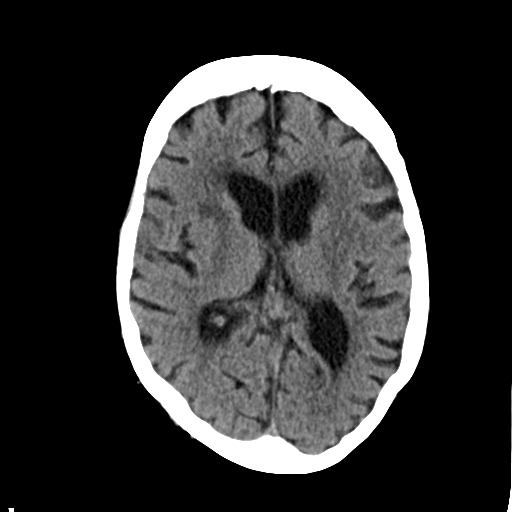
[im 21/34  brain]
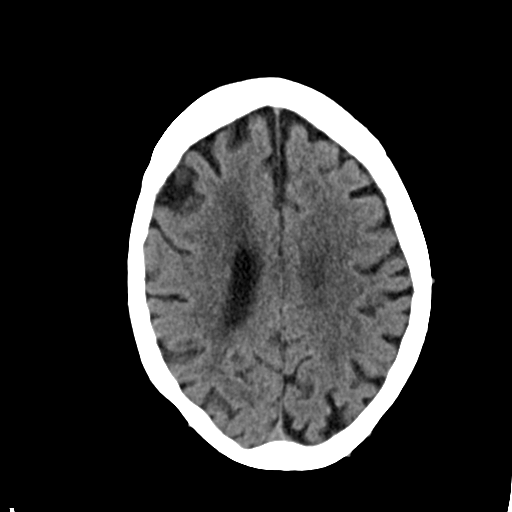
[im 21/34  bone]
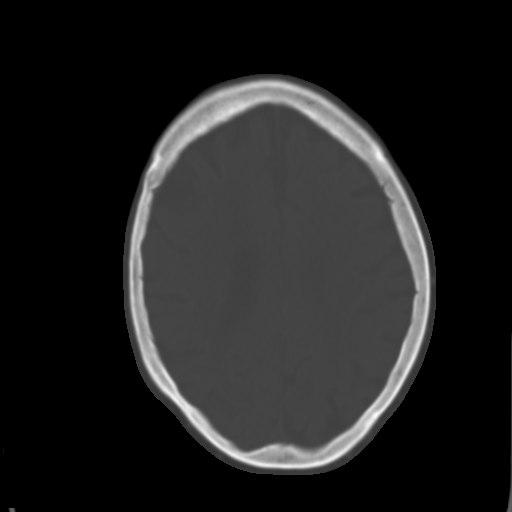
[im 25/34  brain]
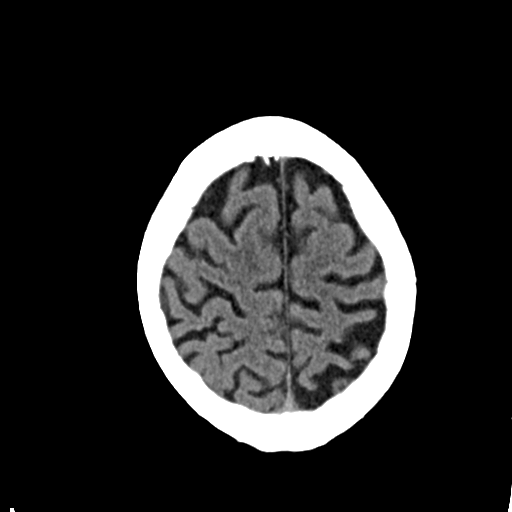
[im 29/34  brain]
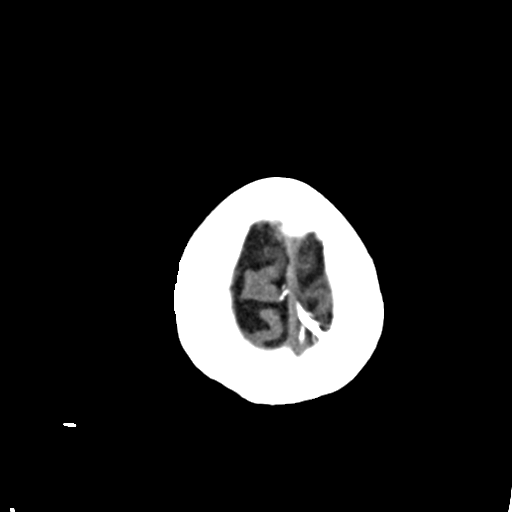

[Series 3: head bone · axial · 0.45mm/px · z∈[-71,-13]mm · 4 of 84 slices shown]
[im 9/84  bone]
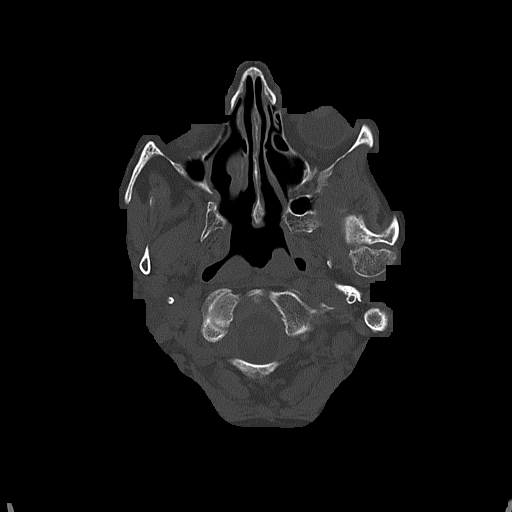
[im 17/84  bone]
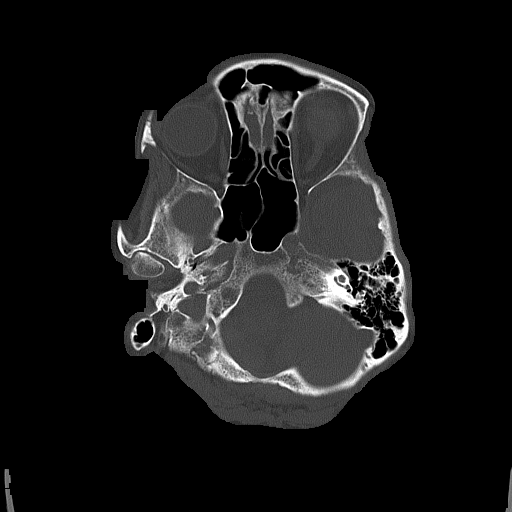
[im 25/84  bone]
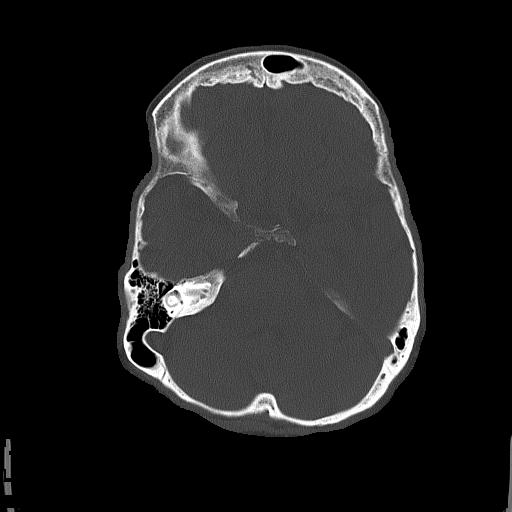
[im 38/84  bone]
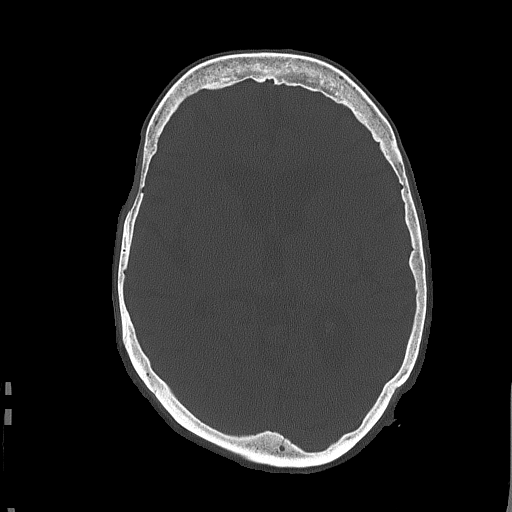

[Series 4: coronal soft tissue · coronal · 0.34mm/px · 3 of 73 slices shown]
[im 25/73  brain]
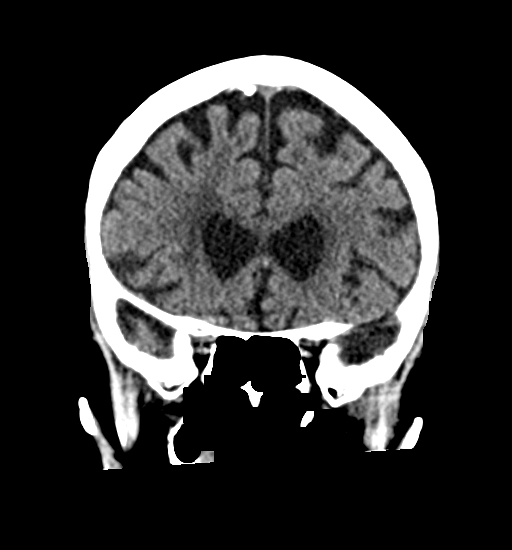
[im 33/73  brain]
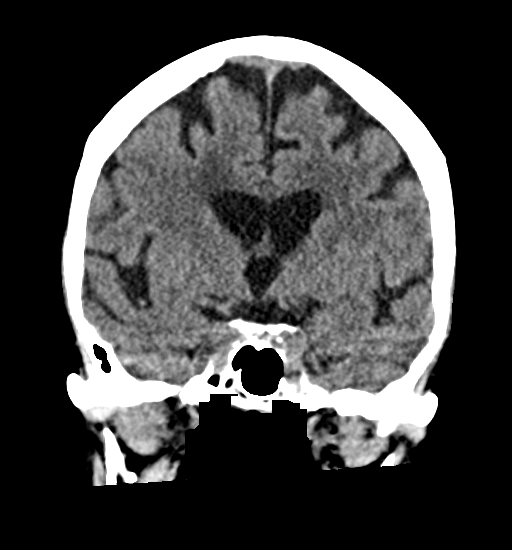
[im 41/73  brain]
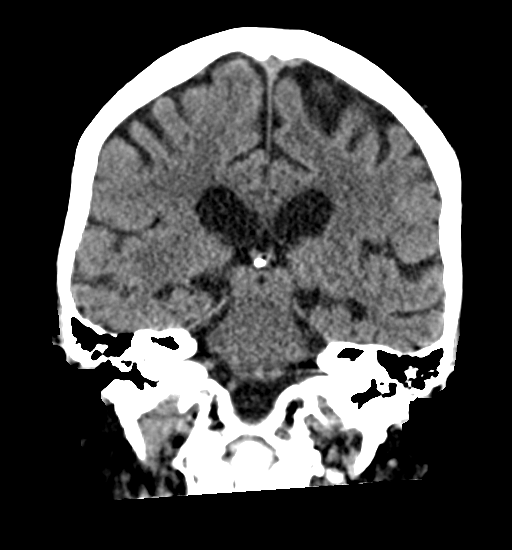

[Series 5: sagittal soft tissue · sagittal · 0.37mm/px · 3 of 51 slices shown]
[im 17/51  brain]
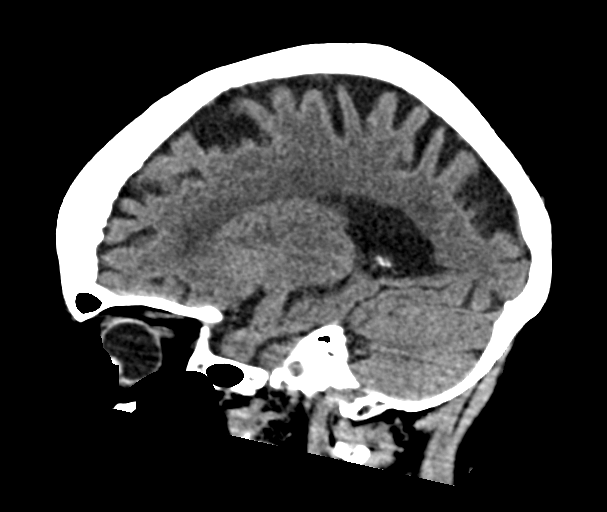
[im 26/51  brain]
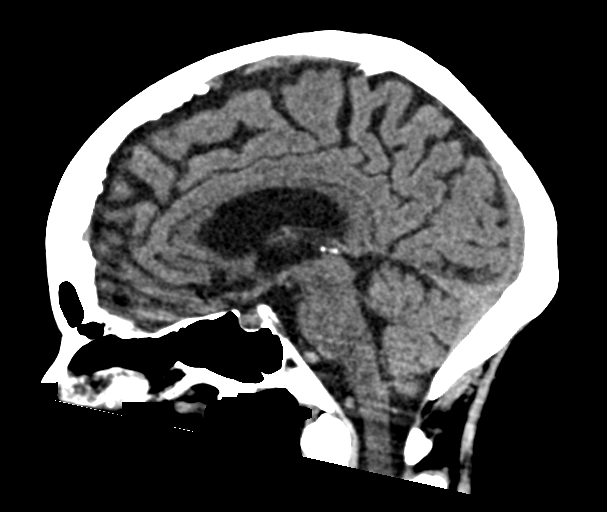
[im 34/51  brain]
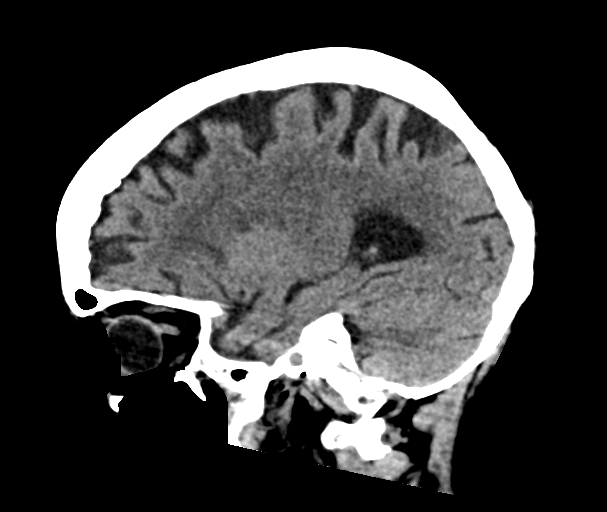

[17 of 47 positions shown; findings below may reference images not displayed]

FINDINGS: Brain: No acute intracranial findings are seen in noncontrast CT
brain. There are no signs of bleeding within the cranium. Cortical
sulci are prominent. There is decreased density in the
periventricular white matter. There is low-density in the basal
ganglia. Overall, no significant interval changes are noted.

Vascular: Unremarkable.

Skull: Unremarkable.

Sinuses/Orbits: There are no air-fluid levels in the paranasal
sinuses.

Other: None.
IMPRESSION: No acute intracranial findings are seen in the noncontrast CT brain.
Atrophy. Small-vessel disease.

## 2024-03-22 ENCOUNTER — Telehealth: Payer: Self-pay

## 2024-03-22 NOTE — Telephone Encounter (Signed)
 Copied from CRM [id number]. Topic: General - Other >> Mar 22, 2024  9:54 AM Marlan Silva wrote: Reason for CRM: Husband Hortence Charter is requesting certified letter with the patients diagnosis on it.

## 2024-05-06 DEATH — deceased
